# Patient Record
Sex: Female | Born: 1945
Health system: Southern US, Community
[De-identification: ages and names within clinical notes are randomized; demographics above are authoritative.]

## PROBLEM LIST (undated history)

## (undated) DIAGNOSIS — D649 Anemia, unspecified: Secondary | ICD-10-CM

## (undated) DIAGNOSIS — T7840XA Allergy, unspecified, initial encounter: Secondary | ICD-10-CM

## (undated) DIAGNOSIS — I10 Essential (primary) hypertension: Secondary | ICD-10-CM

## (undated) DIAGNOSIS — I2510 Atherosclerotic heart disease of native coronary artery without angina pectoris: Secondary | ICD-10-CM

## (undated) DIAGNOSIS — I351 Nonrheumatic aortic (valve) insufficiency: Secondary | ICD-10-CM

## (undated) DIAGNOSIS — J301 Allergic rhinitis due to pollen: Secondary | ICD-10-CM

## (undated) DIAGNOSIS — K219 Gastro-esophageal reflux disease without esophagitis: Secondary | ICD-10-CM

## (undated) DIAGNOSIS — I484 Atypical atrial flutter: Secondary | ICD-10-CM

## (undated) DIAGNOSIS — J45909 Unspecified asthma, uncomplicated: Secondary | ICD-10-CM

## (undated) DIAGNOSIS — M1990 Unspecified osteoarthritis, unspecified site: Secondary | ICD-10-CM

## (undated) DIAGNOSIS — E785 Hyperlipidemia, unspecified: Secondary | ICD-10-CM

## (undated) DIAGNOSIS — Z8709 Personal history of other diseases of the respiratory system: Secondary | ICD-10-CM

## (undated) DIAGNOSIS — I340 Nonrheumatic mitral (valve) insufficiency: Secondary | ICD-10-CM

## (undated) HISTORY — DX: Atherosclerotic heart disease of native coronary artery without angina pectoris: I25.10

## (undated) HISTORY — DX: Essential (primary) hypertension: I10

## (undated) HISTORY — DX: Allergic rhinitis due to pollen: J30.1

## (undated) HISTORY — DX: Unspecified osteoarthritis, unspecified site: M19.90

## (undated) HISTORY — PX: CHOLECYSTECTOMY: SHX55

## (undated) HISTORY — PX: DOPPLER ECHOCARDIOGRAPHY: SHX263

## (undated) HISTORY — PX: TONSILLECTOMY: SUR1361

## (undated) HISTORY — PX: COLONOSCOPY: SHX174

## (undated) HISTORY — DX: Anemia, unspecified: D64.9

## (undated) HISTORY — DX: Hyperlipidemia, unspecified: E78.5

## (undated) HISTORY — DX: Allergy, unspecified, initial encounter: T78.40XA

## (undated) HISTORY — DX: Gastro-esophageal reflux disease without esophagitis: K21.9

## (undated) HISTORY — DX: Nonrheumatic aortic (valve) insufficiency: I35.1

---

## 2005-06-26 DIAGNOSIS — Z8709 Personal history of other diseases of the respiratory system: Secondary | ICD-10-CM

## 2005-06-26 HISTORY — DX: Personal history of other diseases of the respiratory system: Z87.09

## 2005-10-24 HISTORY — PX: CORONARY ARTERY BYPASS GRAFT: SHX141

## 2005-11-24 HISTORY — PX: OTHER SURGICAL HISTORY: SHX169

## 2005-12-05 ENCOUNTER — Encounter: Payer: Self-pay | Admitting: Internal Medicine

## 2006-03-20 ENCOUNTER — Ambulatory Visit: Payer: Self-pay | Admitting: Internal Medicine

## 2006-09-26 ENCOUNTER — Ambulatory Visit: Payer: Self-pay | Admitting: Internal Medicine

## 2006-09-26 LAB — CONVERTED CEMR LAB
ALT: 22 units/L (ref 0–40)
BUN: 15 mg/dL (ref 6–23)
CO2: 30 meq/L (ref 19–32)
Cholesterol: 112 mg/dL (ref 0–200)
Glucose, Bld: 98 mg/dL (ref 70–99)
LDL Cholesterol: 47 mg/dL (ref 0–99)
Phosphorus: 3.8 mg/dL (ref 2.3–4.6)
Potassium: 4.5 meq/L (ref 3.5–5.1)
Sodium: 144 meq/L (ref 135–145)
Total CHOL/HDL Ratio: 2.3
Triglycerides: 79 mg/dL (ref 0–149)

## 2006-09-28 ENCOUNTER — Encounter: Payer: Self-pay | Admitting: Internal Medicine

## 2006-09-28 ENCOUNTER — Ambulatory Visit: Payer: Self-pay | Admitting: Internal Medicine

## 2006-09-28 DIAGNOSIS — M719 Bursopathy, unspecified: Secondary | ICD-10-CM

## 2006-09-28 DIAGNOSIS — I2510 Atherosclerotic heart disease of native coronary artery without angina pectoris: Secondary | ICD-10-CM | POA: Insufficient documentation

## 2006-09-28 DIAGNOSIS — E785 Hyperlipidemia, unspecified: Secondary | ICD-10-CM

## 2006-09-28 DIAGNOSIS — K219 Gastro-esophageal reflux disease without esophagitis: Secondary | ICD-10-CM | POA: Insufficient documentation

## 2006-09-28 DIAGNOSIS — M67919 Unspecified disorder of synovium and tendon, unspecified shoulder: Secondary | ICD-10-CM | POA: Insufficient documentation

## 2006-10-02 ENCOUNTER — Encounter: Payer: Self-pay | Admitting: Internal Medicine

## 2007-03-27 ENCOUNTER — Ambulatory Visit: Payer: Self-pay | Admitting: Internal Medicine

## 2007-03-27 DIAGNOSIS — I351 Nonrheumatic aortic (valve) insufficiency: Secondary | ICD-10-CM

## 2007-03-27 HISTORY — DX: Nonrheumatic aortic (valve) insufficiency: I35.1

## 2007-03-28 LAB — CONVERTED CEMR LAB
ALT: 25 units/L (ref 0–35)
BUN: 14 mg/dL (ref 6–23)
Basophils Relative: 0.1 % (ref 0.0–1.0)
CO2: 30 meq/L (ref 19–32)
Calcium: 9.5 mg/dL (ref 8.4–10.5)
Chloride: 110 meq/L (ref 96–112)
Eosinophils Relative: 4.5 % (ref 0.0–5.0)
GFR calc Af Amer: 82 mL/min
Glucose, Bld: 99 mg/dL (ref 70–99)
HCT: 38 % (ref 36.0–46.0)
LDL Cholesterol: 64 mg/dL (ref 0–99)
Neutrophils Relative %: 55.2 % (ref 43.0–77.0)
Platelets: 225 10*3/uL (ref 150–400)
RBC: 4.07 M/uL (ref 3.87–5.11)
RDW: 12.9 % (ref 11.5–14.6)
TSH: 1.65 microintl units/mL (ref 0.35–5.50)
Total CHOL/HDL Ratio: 2.8
VLDL: 29 mg/dL (ref 0–40)
WBC: 4.8 10*3/uL (ref 4.5–10.5)

## 2007-03-29 ENCOUNTER — Ambulatory Visit: Payer: Self-pay | Admitting: Internal Medicine

## 2007-03-29 ENCOUNTER — Other Ambulatory Visit: Admission: RE | Admit: 2007-03-29 | Discharge: 2007-03-29 | Payer: Self-pay | Admitting: Internal Medicine

## 2007-03-29 ENCOUNTER — Encounter: Payer: Self-pay | Admitting: Internal Medicine

## 2007-04-03 ENCOUNTER — Encounter (INDEPENDENT_AMBULATORY_CARE_PROVIDER_SITE_OTHER): Payer: Self-pay | Admitting: *Deleted

## 2007-04-03 LAB — CONVERTED CEMR LAB: Pap Smear: NORMAL

## 2007-04-10 ENCOUNTER — Ambulatory Visit: Payer: Self-pay | Admitting: Internal Medicine

## 2007-04-16 ENCOUNTER — Ambulatory Visit: Payer: Self-pay

## 2007-04-21 DIAGNOSIS — H60509 Unspecified acute noninfective otitis externa, unspecified ear: Secondary | ICD-10-CM

## 2007-04-24 ENCOUNTER — Ambulatory Visit: Payer: Self-pay | Admitting: *Deleted

## 2007-04-24 ENCOUNTER — Encounter: Payer: Self-pay | Admitting: Internal Medicine

## 2007-04-30 ENCOUNTER — Encounter (INDEPENDENT_AMBULATORY_CARE_PROVIDER_SITE_OTHER): Payer: Self-pay | Admitting: *Deleted

## 2007-09-09 ENCOUNTER — Encounter: Payer: Self-pay | Admitting: Internal Medicine

## 2008-04-29 ENCOUNTER — Ambulatory Visit: Payer: Self-pay | Admitting: Internal Medicine

## 2008-05-06 ENCOUNTER — Encounter: Payer: Self-pay | Admitting: Internal Medicine

## 2008-05-06 ENCOUNTER — Ambulatory Visit: Payer: Self-pay | Admitting: Cardiology

## 2008-05-06 LAB — CONVERTED CEMR LAB
BUN: 23 mg/dL (ref 6–23)
Chloride: 105 meq/L (ref 96–112)
Creatinine, Ser: 0.86 mg/dL (ref 0.40–1.20)
Potassium: 4.4 meq/L (ref 3.5–5.3)

## 2008-05-14 ENCOUNTER — Ambulatory Visit: Payer: Self-pay | Admitting: Internal Medicine

## 2008-05-15 LAB — CONVERTED CEMR LAB
BUN: 18 mg/dL (ref 6–23)
Basophils Absolute: 0.1 10*3/uL (ref 0.0–0.1)
Bilirubin, Direct: 0.1 mg/dL (ref 0.0–0.3)
CO2: 28 meq/L (ref 19–32)
Chloride: 105 meq/L (ref 96–112)
Cholesterol: 153 mg/dL (ref 0–200)
Eosinophils Absolute: 0.3 10*3/uL (ref 0.0–0.7)
Glucose, Bld: 94 mg/dL (ref 70–99)
HCT: 38.5 % (ref 36.0–46.0)
HDL: 64.4 mg/dL (ref >39.0)
Hemoglobin: 13.1 g/dL (ref 12.0–15.0)
MCHC: 34 g/dL (ref 30.0–36.0)
Monocytes Absolute: 0.4 10*3/uL (ref 0.1–1.0)
Monocytes Relative: 8.6 % (ref 3.0–12.0)
Neutro Abs: 2.6 10*3/uL (ref 1.4–7.7)
Phosphorus: 4.4 mg/dL (ref 2.3–4.6)
Platelets: 216 10*3/uL (ref 150–400)
Potassium: 4.4 meq/L (ref 3.5–5.1)
RDW: 13 % (ref 11.5–14.6)
Sodium: 142 meq/L (ref 135–145)
TSH: 1.28 microintl units/mL (ref 0.35–5.50)
Total Bilirubin: 0.8 mg/dL (ref 0.3–1.2)
Total CHOL/HDL Ratio: 2.4
Triglycerides: 107 mg/dL (ref 0–149)

## 2008-05-27 ENCOUNTER — Ambulatory Visit: Payer: Self-pay | Admitting: Internal Medicine

## 2008-05-27 DIAGNOSIS — I10 Essential (primary) hypertension: Secondary | ICD-10-CM

## 2008-07-30 ENCOUNTER — Encounter: Payer: Self-pay | Admitting: Internal Medicine

## 2008-08-03 ENCOUNTER — Encounter: Payer: Self-pay | Admitting: Internal Medicine

## 2008-09-17 ENCOUNTER — Encounter (INDEPENDENT_AMBULATORY_CARE_PROVIDER_SITE_OTHER): Payer: Self-pay | Admitting: *Deleted

## 2008-09-17 ENCOUNTER — Ambulatory Visit: Payer: Self-pay | Admitting: Internal Medicine

## 2008-09-17 ENCOUNTER — Encounter: Payer: Self-pay | Admitting: Internal Medicine

## 2008-09-17 DIAGNOSIS — IMO0001 Reserved for inherently not codable concepts without codable children: Secondary | ICD-10-CM | POA: Insufficient documentation

## 2008-11-11 ENCOUNTER — Telehealth: Payer: Self-pay | Admitting: Internal Medicine

## 2008-11-11 ENCOUNTER — Ambulatory Visit: Payer: Self-pay | Admitting: Internal Medicine

## 2008-11-17 LAB — CONVERTED CEMR LAB
ALT: 23 units/L (ref 0–35)
AST: 26 units/L (ref 0–37)
Albumin: 3.8 g/dL (ref 3.5–5.2)
BUN: 19 mg/dL (ref 6–23)
Cholesterol: 155 mg/dL (ref 0–200)
Creatinine, Ser: 0.9 mg/dL (ref 0.4–1.2)
GFR calc non Af Amer: 67.08 mL/min (ref >60)
Glucose, Bld: 97 mg/dL (ref 70–99)
HDL: 56.1 mg/dL (ref >39.00)
LDL Cholesterol: 73 mg/dL (ref 0–99)
Potassium: 4.7 meq/L (ref 3.5–5.1)
Triglycerides: 128 mg/dL (ref 0.0–149.0)
VLDL: 25.6 mg/dL (ref 0.0–40.0)

## 2009-01-11 ENCOUNTER — Ambulatory Visit: Payer: Self-pay | Admitting: Family Medicine

## 2009-01-11 LAB — CONVERTED CEMR LAB: Rapid Strep: NEGATIVE

## 2009-05-24 ENCOUNTER — Ambulatory Visit: Payer: Self-pay | Admitting: Internal Medicine

## 2009-05-25 LAB — CONVERTED CEMR LAB
Alkaline Phosphatase: 61 units/L (ref 39–117)
BUN: 14 mg/dL (ref 6–23)
Basophils Absolute: 0 10*3/uL (ref 0.0–0.1)
Bilirubin, Direct: 0.1 mg/dL (ref 0.0–0.3)
Chloride: 105 meq/L (ref 96–112)
Creatinine, Ser: 1 mg/dL (ref 0.4–1.2)
Eosinophils Relative: 5.3 % — ABNORMAL HIGH (ref 0.0–5.0)
GFR calc non Af Amer: 59.3 mL/min (ref >60)
LDL Cholesterol: 29 mg/dL (ref 0–99)
Lymphocytes Relative: 31.7 % (ref 12.0–46.0)
Monocytes Relative: 10 % (ref 3.0–12.0)
Phosphorus: 3.9 mg/dL (ref 2.3–4.6)
Platelets: 198 10*3/uL (ref 150.0–400.0)
RDW: 12.9 % (ref 11.5–14.6)
Sodium: 141 meq/L (ref 135–145)
TSH: 1.79 microintl units/mL (ref 0.35–5.50)
Total Bilirubin: 0.8 mg/dL (ref 0.3–1.2)
Total CHOL/HDL Ratio: 2
VLDL: 14.4 mg/dL (ref 0.0–40.0)
WBC: 4 10*3/uL — ABNORMAL LOW (ref 4.5–10.5)

## 2009-05-26 ENCOUNTER — Ambulatory Visit: Payer: Self-pay | Admitting: Internal Medicine

## 2009-07-12 ENCOUNTER — Ambulatory Visit: Payer: Self-pay | Admitting: Internal Medicine

## 2009-07-22 ENCOUNTER — Ambulatory Visit: Payer: Self-pay

## 2009-07-22 ENCOUNTER — Encounter: Payer: Self-pay | Admitting: Internal Medicine

## 2009-08-04 ENCOUNTER — Encounter: Payer: Self-pay | Admitting: Internal Medicine

## 2009-12-08 ENCOUNTER — Telehealth: Payer: Self-pay | Admitting: Internal Medicine

## 2009-12-10 ENCOUNTER — Encounter: Payer: Self-pay | Admitting: Internal Medicine

## 2010-02-11 ENCOUNTER — Encounter: Payer: Self-pay | Admitting: Internal Medicine

## 2010-02-21 ENCOUNTER — Ambulatory Visit: Payer: Self-pay | Admitting: Internal Medicine

## 2010-02-24 ENCOUNTER — Ambulatory Visit: Payer: Self-pay | Admitting: Internal Medicine

## 2010-03-02 LAB — CONVERTED CEMR LAB
ALT: 22 units/L (ref 0–35)
BUN: 17 mg/dL (ref 6–23)
Bilirubin, Direct: 0.1 mg/dL (ref 0.0–0.3)
CO2: 27 meq/L (ref 19–32)
Chloride: 103 meq/L (ref 96–112)
Cholesterol: 159 mg/dL (ref 0–200)
Creatinine, Ser: 1 mg/dL (ref 0.4–1.2)
Glucose, Bld: 95 mg/dL (ref 70–99)
Total CHOL/HDL Ratio: 3
Total Protein: 6.4 g/dL (ref 6.0–8.3)
Triglycerides: 103 mg/dL (ref 0.0–149.0)

## 2010-04-14 ENCOUNTER — Encounter: Payer: Self-pay | Admitting: Internal Medicine

## 2010-06-14 ENCOUNTER — Ambulatory Visit: Payer: Self-pay | Admitting: Internal Medicine

## 2010-06-14 ENCOUNTER — Encounter: Payer: Self-pay | Admitting: Internal Medicine

## 2010-07-26 NOTE — Miscellaneous (Signed)
Summary: FLU VACCINE   Clinical Lists Changes  Observations: Added new observation of FLU VAX: Historical (02/24/2010 14:52)      Influenza Immunization History:    Influenza # 1:  Historical (02/24/2010)

## 2010-07-26 NOTE — Assessment & Plan Note (Signed)
Summary: ROV/AMD  Medications Added CALCIUM 500 MG TABS (CALCIUM CARBONATE) take 1 by mouth once daily      Allergies Added:   Visit Type:  rov Primary Provider:  Cindee Salt MD  CC:  no cp, no swelling, and no sob.  History of Present Illness: 65 y/o woman with h/o 1-v CAD s/p CABG 2007, HTN, mild AI and HL. Returns for routine f/u.  Father died a week ago and she is sad over that. No CP or dyspnea. No palpitations. No HF symptoms.  Very active with walking and indoor/outdoor activities. Tolerating all meds well.  No focal neuro deficits.  Lipids from 12/10 TC 89 HDL 45, LDL 29.    Current Medications (verified): 1)  Multivitamins   Tabs (Multiple Vitamin) .... Take One By Mouth Once A Day 2)  Aspirin 81 Mg  Tbec (Aspirin) .... Take One By Mouth Once A Day 3)  Simvastatin 40 Mg Tabs (Simvastatin) .... 1/2 Daily or As Directed 4)  Cozaar 50 Mg Tabs (Losartan Potassium) .... Take One Tablet By Mouth Daily 5)  Calcium 500 Mg Tabs (Calcium Carbonate) .... Take 1 By Mouth Once Daily 6)  Glucosamine 500 Mg Caps (Glucosamine Sulfate) .... Take 1 By Mouth Two Times A Day 7)  Lutein 6 Mg Caps (Lutein) .... Take 1 By Mouth Once Daily 8)  Fish Oil 1000 Mg Caps (Omega-3 Fatty Acids) .... Take 1 By Mouth Two Times A Day  Allergies (verified): 1)  * Morphine 2)  * Opiates 3)  Codeine 4)  Lisinopril  Past History:  Past Medical History: Last updated: 09/14/2008 1. CAD, one-vessel     a.  s/p LIMA->LAD, [facility] 2007 2. Hypertension 3. Hyperlipidemia     a. Unable to tolerate simva 40 due to myalgias 4. Mild aortic inssuficiency     a.  Echocardiogram in October 2008, showed an EF of 55-65% with mild AI 5. GERD  Review of Systems       As per HPI and past medical history; otherwise all systems negative.   Vital Signs:  Patient profile:   65 year old female Height:      64 inches Weight:      179.50 pounds BMI:     30.92 Pulse rate:   62 / minute Pulse  rhythm:   regular BP sitting:   134 / 76  (right arm) Cuff size:   regular  Vitals Entered By: Mercer Pod (July 12, 2009 4:04 PM)  Physical Exam  General:  Gen: well appearing. no resp difficulty HEENT: normal Neck: supple. no JVD. Carotids 2+ bilat; soft L bruits. No lymphadenopathy or thryomegaly appreciated. Cor: PMI nondisplaced. Regular rate & rhythm. No rubs, gallops. soft 2/6 systolic murmur RSB soft diastolic murmur Lungs: clear Abdomen: soft, nontender, nondistended. No hepatosplenomegaly. No bruits or masses. Good bowel sounds. Extremities: no cyanosis, clubbing, rash, edema Neuro: alert & orientedx3, cranial nerves grossly intact. moves all 4 extremities w/o difficulty. affect pleasant    Impression & Recommendations:  Problem # 1:  CORONARY ARTERY DISEASE (ICD-414.00) Stable. No evidence of ischemia. Continue current regimen.  Problem # 2:  HYPERTENSION (ICD-401.9) BP a bit high today but overall well controlled. Continue current regimen  Problem # 3:  BRUIT (ICD-785.9) Check carotid u/s.  Orders: Carotid Duplex (Carotid Duplex)  Problem # 4:  AORTIC STENOSIS/ INSUFFICIENCY, NON-RHEUMATIC (ICD-424.1) Due for f/u echo.   Her updated medication list for this problem includes:    Cozaar 50 Mg Tabs (  Losartan potassium) .Marland Kitchen... Take one tablet by mouth daily  Patient Instructions: 1)  Your physician recommends that you schedule a follow-up appointment in: 6 months 2)  Your physician recommends that you continue on your current medications as directed. Please refer to the Current Medication list given to you today. 3)  Your physician has requested that you have a carotid duplex. This test is an ultrasound of the carotid arteries in your neck. It looks at blood flow through these arteries that supply the brain with blood. Allow one hour for this exam. There are no restrictions or special instructions. 4)  Your physician has requested that you have an  echocardiogram.  Echocardiography is a painless test that uses sound waves to create images of your heart. It provides your doctor with information about the size and shape of your heart and how well your heart's chambers and valves are working.  This procedure takes approximately one hour. There are no restrictions for this procedure. 07/22/09 @ 1:00

## 2010-07-26 NOTE — Miscellaneous (Signed)
Summary: Lipid/CMET  Clinical Lists Changes  Orders: Added new Test order of T-Lipid Profile [phone]) - Signed Added new Test order of T-Comprehensive Metabolic Panel [id number]) - Signed

## 2010-07-26 NOTE — Progress Notes (Signed)
Summary: RX Losartin   Phone Note Refill Request Call back at Home Phone [phone] Message from:  Patient on December 08, 2009 9:02 AM  LOZARTIN 100 MG 1 TABLET PER DAY-[facility]-PLEASE CALL PATIENT  Initial call taken by: Harlon Flor,  December 08, 2009 9:03 AM  Follow-up for Phone Call        PT IS CALLING ABOUT THE RX AGAIN-WOULD LIKE FOR SOMEONE TO LEAVE A MESSAGE ON HER ANSWERING MACHINE STATING WHAT SHE NEEDS TO DO TO GET THIS REFILLED, AS SHE IS DOWN TO HER LAST TABLET Follow-up by: Harlon Flor,  December 08, 2009 11:45 AM    Prescriptions: COZAAR 50 MG TABS (LOSARTAN POTASSIUM) Take one tablet by mouth daily  #30 x 6   Entered by:   Bishop Dublin, CMA   Authorized by:   Dolores Patty, MD, [facility]   Signed by:   Bishop Dublin, CMA on 12/08/2009   Method used:   Faxed to ...       [facility] [place] (mail-order)             , [place]         Ph: [phone]       Fax: [phone]   RxID:   [id number]

## 2010-07-26 NOTE — Miscellaneous (Signed)
Summary: Losartan   Clinical Lists Changes  Medications: Rx of COZAAR 50 MG TABS (LOSARTAN POTASSIUM) Take one tablet by mouth daily;  #90 x 2;  Signed;  Entered by: Benedict Needy, RN;  Authorized by: Dolores Patty, MD, [facility];  Method used: Faxed to [facility] [place], , , [place]  , Ph: [phone], Fax: [phone]    Prescriptions: COZAAR 50 MG TABS (LOSARTAN POTASSIUM) Take one tablet by mouth daily  #90 x 2   Entered by:   Benedict Needy, RN   Authorized by:   Dolores Patty, MD, [facility]   Signed by:   Benedict Needy, RN on 12/10/2009   Method used:   Faxed to ...       [facility] [place] (mail-order)             , [place]         Ph: [phone]       Fax: [phone]   RxID:   [id number]

## 2010-07-26 NOTE — Assessment & Plan Note (Signed)
Summary: F6M/AMD      Allergies Added:   Visit Type:  Follow-up Primary Provider:  Cindee Salt MD  CC:  Denies chest pain or shortness of breath..  History of Present Illness: 65 y/o woman with h/o 1-v CAD s/p CABG 2007, HTN, mild AI and HL. Returns for routine f/u.  Doing well very active with CP or SOB. Stopped her statin for a month to see if it would improve her aches and no change. Was using a pedometer and walking 3 miles a day as part of her daily activities. SBP typically 130-135.   Recent lipids  TC 159 mg/dL TG 831  HDL 53  LDL 85   Current Medications (verified): 1)  Multivitamins   Tabs (Multiple Vitamin) .... Take One By Mouth Once A Day 2)  Aspirin 81 Mg  Tbec (Aspirin) .... Take One By Mouth Once A Day 3)  Simvastatin 40 Mg Tabs (Simvastatin) .... 1/2 Daily or As Directed 4)  Cozaar 50 Mg Tabs (Losartan Potassium) .... Take One Tablet By Mouth Daily 5)  Calcium 500 Mg Tabs (Calcium Carbonate) .... Take 1 By Mouth Once Daily 6)  Glucosamine 500 Mg Caps (Glucosamine Sulfate) .... Take 1 By Mouth Two Times A Day 7)  Lutein 6 Mg Caps (Lutein) .... Take 1 By Mouth Once Daily 8)  Fish Oil 1000 Mg Caps (Omega-3 Fatty Acids) .... Take 1 By Mouth Two Times A Day  Allergies (verified): 1)  * Morphine 2)  * Opiates 3)  Codeine 4)  Lisinopril  Past History:  Past Medical History: Last updated: 09/14/2008 1. CAD, one-vessel     a.  s/p LIMA->LAD, [facility] 2007 2. Hypertension 3. Hyperlipidemia     a. Unable to tolerate simva 40 due to myalgias 4. Mild aortic inssuficiency     a.  Echocardiogram in October 2008, showed an EF of 55-65% with mild AI 5. 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   Vital Signs:  Patient profile:   65 year old female Height:      64 inches Weight:      190 pounds BMI:     32.73 Pulse rate:   71 / minute BP sitting:   138 / 72  (left arm) Cuff size:   regular  Vitals Entered By: Bishop Dublin, CMA (February 24, 2010 1:53 PM)  Physical Exam  General:  Well appearing. no resp difficulty HEENT: normal Neck: supple. no JVD. Carotids 2+ bilat; soft L bruits. No lymphadenopathy or thryomegaly appreciated. Cor: PMI nondisplaced. Regular rate & rhythm. No rubs, gallops. soft 2/6 systolic murmur RSB soft diastolic murmur Lungs: clear Abdomen: soft, nontender, nondistended. No hepatosplenomegaly. No bruits or masses. Good bowel sounds. Extremities: no cyanosis, clubbing, rash, edema. severe varicose veins Neuro: alert & orientedx3, cranial nerves grossly intact. moves all 4 extremities w/o difficulty. affect  pleasant    Impression & Recommendations:  Problem # 1:  CORONARY ARTERY DISEASE (ICD-414.00) Stable. No evidence of ischemia. Continue current regimen.  Problem # 2:  AORTIC STENOSIS/ INSUFFICIENCY, NON-RHEUMATIC (ICD-424.1) Mild. Asymptomatic. Can consider f/u echo as needed.   Problem # 3:  HYPERLIPIDEMIA (ICD-272.4) LDL just above goal. Will restart statin. Encouraged her to get more regular exercise and keep her weight down.   Other Orders: EKG w/ Interpretation (93000)  Patient Instructions: 1)  Your physician recommends that you schedule a follow-up appointment in: 1 year.

## 2010-07-28 NOTE — Assessment & Plan Note (Signed)
Summary: FOLLOW UP   Vital Signs:  Patient profile:   65 year old female Weight:      192 pounds Temp:     98.3 degrees F oral Pulse rate:   68 / minute Pulse rhythm:   regular BP sitting:   140 / 70  (left arm) Cuff size:   regular  Vitals Entered By: Mervin Hack CMA Duncan Dull) (June 14, 2010 10:08 AM) CC: follow-up visit   History of Present Illness: Has been coughing for 2 months thinks she has gotten the same thing twice croupy cough and nasty tasting discharge May be clearing up mucus is clear from nose No fever Slight SOB--intermittent At times had sense of congestion in lungs  heart is fine No chest pain stays active and exercises regularly--walks, gardening  Did try off the simvastatin due to arm aching No real improvement Now back on it but at half dose  Was having increasing bouts of reflux Believes some of it was related to dietary choices Occ night reflux and day irritation Much better on the omeprazole  Allergies: 1)  * Morphine 2)  * Opiates 3)  Codeine 4)  Lisinopril  Past History:  Past medical, surgical, family and social histories (including risk factors) reviewed for relevance to current acute and chronic problems.  Past Medical History: 1. CAD, one-vessel     a.  s/p LIMA->LAD, [facility] 2007 2. Hypertension 3. Hyperlipidemia     a. Unable to tolerate simvastatin  40 due to myalgias 4. Mild aortic inssuficiency     a.  Echocardiogram in October 2008, showed an EF of 55-65% with mild AI 5. GERD  Past Surgical History: Reviewed history from 03/12/2007 and no changes required. Coronary artery bypass graft--readmitted for post op pleural effusions 05/07 Cholecystectomy Tonsillectomy C-section x1 Stress imaging negative EF 67% 06/07 Echo- NV LV EF 1+ AI/MR  Family History: Reviewed history from 03/12/2007 and no changes required. Father: Alive Mother: Janalyn Shy, MI at 68 One brother  CAD--Mat GM deceased at age 61 with  MI No HTN or DM No colon or breast cancer  Social History: Reviewed history from 03/29/2007 and no changes required. 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   Nose:  mild congestion slight irritation and blood on right Mouth:  no erythema, no exudates, and no lesions.   Neck:  supple, no masses, and no cervical lymphadenopathy.   Lungs:  normal respiratory effort, no intercostal retractions, no accessory muscle use, normal breath sounds, no crackles, and no wheezes.   Heart:  normal rate, regular rhythm, no murmur, and no gallop.   Abdomen:  soft, non-tender, and no masses.   Extremities:  no edema Psych:  normally interactive, good eye contact, not anxious appearing, and not depressed appearing.     Impression & Recommendations:  Problem # 1:  COUGH (ICD-786.2) Assessment New  sounds like she may have atypical infeciton never overly sick antibioitics don't seem indicated mild obstruction on spirometry--not sure if this is different from her baseline for now, will just try albuterol inhaler  Orders: Spirometry w/Graph (94010)  Problem # 2:  GERD (ICD-530.81) Assessment: Deteriorated had worsened but is controlled on the omeprazole regularly  The following medications were removed from the medication list:  [facility] Omeprazole 20.6 (20 Base) Mg Cpdr (Omeprazole magnesium) .Marland Kitchen... Take 1 by mouth once daily Her updated medication list for this problem includes:    Omeprazole 20 Mg Cpdr (Omeprazole) .Marland Kitchen... 1 tab daily for persistent reflux symptoms  Problem # 3:  HYPERTENSION (ICD-401.9) Assessment: Unchanged good control no changes needed  Her updated medication list for this problem includes:    Cozaar 50 Mg Tabs (Losartan potassium) .Marland Kitchen... Take  one tablet by mouth daily  BP today: 140/70 Prior BP: 138/72 (02/24/2010)  Labs Reviewed: K+: 4.7 (02/21/2010) Creat: : 1.0 (02/21/2010)   Chol: 159 (02/21/2010)   HDL: 53.00 (02/21/2010)   LDL: 85 (02/21/2010)   TG: 103.0 (02/21/2010)  Problem # 4:  CORONARY ARTERY DISEASE (ICD-414.00) Assessment: Unchanged has been quiet sees Dr Jones Broom  Her updated medication list for this problem includes:    Cozaar 50 Mg Tabs (Losartan potassium) .Marland Kitchen... Take one tablet by mouth daily    Aspirin 81 Mg Tbec (Aspirin) .Marland Kitchen... Take one by mouth once a day  Complete Medication List: 1)  Simvastatin 40 Mg Tabs (Simvastatin) .... 1/2 daily or as directed 2)  Cozaar 50 Mg Tabs (Losartan potassium) .... Take one tablet by mouth daily 3)  Multivitamins Tabs (Multiple vitamin) .... Take one by mouth once a day 4)  Aspirin 81 Mg Tbec (Aspirin) .... Take one by mouth once a day 5)  Calcium 500 Mg Tabs (Calcium carbonate) .... Take 1 by mouth once daily 6)  Glucosamine 500 Mg Caps (Glucosamine sulfate) .... Take 1 by mouth two times a day 7)  Lutein 6 Mg Caps (Lutein) .... Take 1 by mouth once daily 8)  Fish Oil 1000 Mg Caps (Omega-3 fatty acids) .... Take 1 by mouth two times a day 9)  Ventolin Hfa 108 (90 Base) Mcg/act Aers (Albuterol sulfate) .... 2 puffs up to four times daily for cough or wheezing 10)  Omeprazole 20 Mg Cpdr (Omeprazole) .Marland Kitchen.. 1 tab daily for persistent reflux symptoms  Other Orders: Radiology Referral (Radiology)  Patient Instructions: 1)  Please schedule a follow-up appointment in 1 year for Medicare Wellness visit 2)  Please set up bone density scan Prescriptions: OMEPRAZOLE 20 MG CPDR (OMEPRAZOLE) 1 tab daily for persistent reflux symptoms  #90 x 3   Entered and Authorized by:   Cindee Salt MD   Signed by:   Cindee Salt MD on 06/14/2010   Method used:   Faxed to ...       [facility] [place] (mail-order)             , [place]         Ph: [phone]       Fax: [phone]    RxID:   [id number] VENTOLIN HFA 108 (90 BASE) MCG/ACT AERS (ALBUTEROL SULFATE) 2 puffs up to four times daily for cough or wheezing  #1 x 1   Entered and Authorized by:   Cindee Salt MD   Signed by:   Cindee Salt MD on 06/14/2010   Method used:   Electronically to        [facility]. [address] [phone]* (retail)       [address] [place], [place]  [id number]       Ph: [phone]       Fax: [phone]   RxID:   [id number]    Orders Added: 1)  Est. Patient Level IV [16010] 2)  Spirometry w/Graph [94010] 3)  Radiology Referral [Radiology]    Current Allergies (reviewed today): * MORPHINE * OPIATES CODEINE LISINOPRIL

## 2010-09-21 ENCOUNTER — Ambulatory Visit: Payer: Medicare Other | Admitting: Internal Medicine

## 2010-09-26 ENCOUNTER — Telehealth: Payer: Self-pay | Admitting: Internal Medicine

## 2010-09-26 DIAGNOSIS — M8580 Other specified disorders of bone density and structure, unspecified site: Secondary | ICD-10-CM

## 2010-09-26 NOTE — Telephone Encounter (Signed)
Pt is out of town and will not be back in town until October 10, 2010

## 2010-09-26 NOTE — Telephone Encounter (Signed)
Please call The bone density study just shows mild bone loss, or osteopenia Other than regular weight bearing exercise (like walking), I would just recommend vitamin D 1000 int units daily (OTC) and some dietary calcium (dairy, cheese, etc) We can repeat this in 3-5 years to make sure it is stable

## 2010-09-27 ENCOUNTER — Encounter: Payer: Self-pay | Admitting: *Deleted

## 2010-09-27 NOTE — Telephone Encounter (Signed)
Letter mailed to patient's home address with results, patient will not be back in town until April 16

## 2010-09-29 ENCOUNTER — Encounter: Payer: Self-pay | Admitting: Internal Medicine

## 2010-10-20 ENCOUNTER — Other Ambulatory Visit: Payer: Self-pay | Admitting: Internal Medicine

## 2010-11-08 NOTE — Assessment & Plan Note (Signed)
[facility] OFFICE NOTE   LASHANA, SPANG                       MRN:          [id number]  DATE:04/10/2007                            DOB:          12-Nov-1945    REFERRING PHYSICIAN:  Dr. Tillman Abide.   REASON FOR CONSULTATION:  Coronary artery disease; establish long term  care.   Ms. Hostler is a delightful 65 year old woman with a history of one  vessel coronary artery disease status post bypass surgery in 2007 with a  LIMA to the LAD at [facility] in [place].   She actually has a fairly interesting story. She denies any significant  cardiac risk factors. She previously worked in the [employer] and  after retiring, her and her husband spent 3.5 years traveling through  the country, [place], and [place] in an RV. While they were in [place],  she developed exertional chest pain. She underwent cardiac  catheterization at [facility] and was found to have one vessel CAD  with an isolated blockage in her LAD. Apparently, she was told that  there was some mechanical deformity of the LAD, which led to a  deposition or acceleration of atherosclerosis, which she required bypass  for.   Since that time, she has done great. She has had occasional aches and  pains in her chest, but she attributes this mostly to her surgery. She  has not had any angina. She does note that when she goes up a lot of  steps, she sometimes gets short of breath, but thinks that this is due  to her age. She does admit to the fact that she is nervous about the  possibility of having problems with her heart again. Other than that,  she remains fairly active without any problems.   REVIEW OF SYSTEMS:  Notable for hay fever, allergies, asthma, as well as  osteoarthritis. All other systems are negative except for HPI and  problem list. She sometimes feels a bit sluggish.   PROBLEM LIST:  1. Coronary artery disease.     a.     Status post LIMA to the LAD in May 2000 at [facility]       in [place] as described above.  2. Mild obesity.  3. History of gallstones status post cholecystectomy.   CURRENT MEDICATIONS:  1. Simvastatin 20.  2. Metoprolol 25 b.i.d.  3. Multivitamin.  4. Calcium.  5. Glucosamine.  6. Fish oil t.i.d.  7. Aspirin 81.   ALLERGIES:  OPIATES including MORPHINE and CODEINE.   SOCIAL HISTORY:  She is married with one child. She is retired. Social  alcohol. She quit smoking in 1996.   FAMILY HISTORY:  Mother is alive and well at 68. Father is alive and  well at 68. One brother alive and well at 21. No family history of  premature coronary disease.   PHYSICAL EXAMINATION:  GENERAL:  She is a delightful woman. She  ambulates around the clinic without any respiratory difficulty.  VITAL SIGNS:  Initial blood pressure is 108/72; on manual recheck  154/78,  heart rate 53, weight 180.  HEENT:  Normal.  NECK:  Supple. No JVD. Carotids are 2+ bilaterally without any bruits.  There is no lymphadenopathy or thyromegaly.  CARDIAC:  PMI is non-displaced. Regular rate and rhythm, no murmurs,  rubs, or gallops.  CHEST:  Her sternum is stable.  LUNGS:  Clear.  ABDOMEN:  Soft, nontender, nondistended, no hepatosplenomegaly, no  masses or bruits appreciated. Good bowel sounds.  EXTREMITIES:  Warm with no cyanosis, clubbing, or edema. Good pulses. No  rash.  NEUROLOGIC:  Alert and oriented x3. Cranial nerves II-XII are intact.  She can use all 4 extremities without difficulty. Affect is pleasant.   EKG shows sinus bradycardia at a rate of 53 with no ST-T wave  abnormalities.   ASSESSMENT AND PLAN:  1. Coronary artery disease status post CABG. She is doing great. She      is a bit bradycardic and feels somewhat sluggish. I think it is      reasonable to wean down her beta blocker and see how she does. She      does not have a history of having a heart attack, so I feel fairly       comfortable with this.  2. Hyperlipidemia. She recently decreased her dose of Simvastatin to      20 mg from 40 mg. I told her that it is probably reasonable to stay      on 40 mg and keep her LDL under 70.  3. Hypertension. I suspect that she has a white coat hypertension      though we did discuss strategies to keep her blood pressure low      including weight loss and salt restriction. I have asked her to      keep a blood pressure log at home.   DISPOSITION:  We will see her back for routine follow up in one year. We  will get a baseline 2D echocardiogram. She knows to call me sooner if  she has any problems.     Bevelyn Buckles. Bensimhon, MD  Electronically Signed   DRB/MedQ  DD: 04/10/2007  DT: 04/11/2007  Job #: [id number]   cc:   Karie Schwalbe, MD

## 2010-11-08 NOTE — Assessment & Plan Note (Signed)
[facility] OFFICE NOTE   Denise Henson, Denise Henson                       MRN:          [id number]  DATE:04/29/2008                            DOB:          19-Apr-1946    PRIMARY CARE PHYSICIAN:  Karie Schwalbe, MD   INTERVAL HISTORY:  Denise Henson is a delightful 65 year old woman with a  history of one-vessel coronary artery disease status post bypass surgery  in 2007 with a LIMA to the LAD at [facility] in [place].  She also  has a history of borderline hypertension and hyperlipidemia.  Echocardiogram in October 2008, showed an EF of 55-65% with mild aortic  insufficiency.   She returns today for routine followup.  She is doing great.  She is  very active, no chest pain or shortness of breath.  Dr. Alphonsus Sias stopped  her Lopressor, there is some question about this worsening up, this is  producing fatigue for her.  She has not noticed much of a difference.  She also was previously on simvastatin 40, but had to drop it back down  to 20 due to severe myalgias in her arms.   She has not had any problem with heart failure symptoms.  She notes that  when she goes to [employer], her blood pressure is often 150/80 and she is  worried that she is developing overt hypertension.   CURRENT MEDICATIONS:  1. Simvastatin 20.  2. Multivitamin.  3. Calcium.  4. Glucosamine.  5. Aspirin 81.  6. Fish oil.  7. Coenzyme Q10.   PHYSICAL EXAMINATION:  GENERAL:  She is well-appearing in no acute  distress, ambulates around the clinic without any respiratory  difficulty.  Blood pressures initially is 152/82 with a followup 144/84,  pulse is 65, weight is 180.  HEENT:  Normal.  NECK:  Supple.  No JVD, carotids are 2+ bilaterally without any bruits.  There is no lymphadenopathy or thyromegaly.  CARDIAC:  Regular rate and rhythm.  No murmurs, rubs, or gallops.  LUNGS:  Clear.  ABDOMEN:  Soft, nontender, nondistended.  No  hepatosplenomegaly.  No  bruits.  No masses.  Good bowel sounds.  EXTREMITIES:  Warm with no cyanosis, clubbing, or edema, no rash.  NEURO:  Alert and oriented x3.  Cranial nerves II through XII are  intact.  Moves all 4 extremities without difficulty.  Affect is  pleasant.   ASSESSMENT AND PLAN:  1. Coronary artery disease, is stable.  No evidence of ischemia.      Continue current therapy.  2. Hypertension.  Blood pressure is moderately elevated.  We will      start her on ramipril 2.5 mg a day.  See how she does, we will      check a BMET in 1 week.  I did warn her that about 10% of people      will develop a cough with an ACE inhibitor and she should let us      know if this is a problem.   DISPOSITION:  We will see her back in several months  for followup.     Bevelyn Buckles. Bensimhon, MD  Electronically Signed    DRB/MedQ  DD: 04/29/2008  DT: 04/30/2008  Job #: [id number]

## 2010-12-13 ENCOUNTER — Encounter: Payer: Self-pay | Admitting: Cardiovascular Disease

## 2011-01-07 ENCOUNTER — Other Ambulatory Visit: Payer: Self-pay | Admitting: Internal Medicine

## 2011-01-09 NOTE — Telephone Encounter (Signed)
rx sent to pharmacy by e-script  

## 2011-02-16 ENCOUNTER — Other Ambulatory Visit: Payer: Self-pay | Admitting: Internal Medicine

## 2011-02-16 NOTE — Telephone Encounter (Signed)
rx sent to pharmacy by e-script  

## 2011-02-24 ENCOUNTER — Encounter: Payer: Self-pay | Admitting: Cardiovascular Disease

## 2011-02-28 ENCOUNTER — Encounter: Payer: Self-pay | Admitting: Cardiovascular Disease

## 2011-02-28 ENCOUNTER — Ambulatory Visit (INDEPENDENT_AMBULATORY_CARE_PROVIDER_SITE_OTHER): Payer: Medicare Other | Admitting: Cardiovascular Disease

## 2011-02-28 DIAGNOSIS — I2510 Atherosclerotic heart disease of native coronary artery without angina pectoris: Secondary | ICD-10-CM

## 2011-02-28 DIAGNOSIS — I359 Nonrheumatic aortic valve disorder, unspecified: Secondary | ICD-10-CM

## 2011-02-28 DIAGNOSIS — R0989 Other specified symptoms and signs involving the circulatory and respiratory systems: Secondary | ICD-10-CM

## 2011-02-28 DIAGNOSIS — I10 Essential (primary) hypertension: Secondary | ICD-10-CM

## 2011-02-28 DIAGNOSIS — E785 Hyperlipidemia, unspecified: Secondary | ICD-10-CM

## 2011-02-28 NOTE — Patient Instructions (Signed)
You are doing well. No medication changes were made. Please call us if you have new issues that need to be addressed before your next appt.  We will call you for a follow up Appt. In 12 months  

## 2011-02-28 NOTE — Progress Notes (Signed)
Patient ID: Denise Henson, female    DOB: 01-14-1946, 65 y.o.   MRN: [id number]  HPI Comments: 65 y/o woman with h/o 1-v CAD s/p CABG 2007, HTN, mild AI and HL. Mild bilateral carotid arterial disease, remote history of smoking for 20-30 years. Returns for routine f/u.   Doing well very active with no CP or SOB. She is very difficult to tends to her gardens and fish ponds. No complaints.   SBP typically 130-135.     Recent lipids  TC 159 mg/dL TG 409  HDL 53  LDL 85 ( this lab was on no cholesterol medication)  EKG shows normal sinus rhythm with rate 80 beats per minute with no significant ST or T wave changes.    Outpatient Encounter Prescriptions as of 02/28/2011  Medication Sig Dispense Refill  . albuterol (VENTOLIN HFA) 108 (90 BASE) MCG/ACT inhaler Inhale 2 puffs into the lungs every 6 (six) hours as needed.        Marland Kitchen aspirin 81 MG EC tablet Take 81 mg by mouth daily.        . calcium gluconate 500 MG tablet Take 500 mg by mouth daily.        . Glucosamine 500 MG CAPS Take 500 mg by mouth 2 (two) times daily.        Marland Kitchen losartan (COZAAR) 50 MG tablet TAKE 1 TABLET DAILY  90 tablet  3  . Lutein 6 MG CAPS Take 6 mg by mouth daily.        . Omega-3 Fatty Acids (FISH OIL) 1000 MG CAPS Take 1,000 mg by mouth 2 (two) times daily.        Marland Kitchen omeprazole (PRILOSEC) 20 MG capsule Take 20 mg by mouth daily. For persistent reflux symptoms.       . simvastatin (ZOCOR) 40 MG tablet TAKE ONE-HALF (1/2) TABLET DAILY OR AS DIRECTED  45 tablet  1  . DISCONTD: multivitamin (THERAGRAN) per tablet Take 1 tablet by mouth daily.          Review of Systems  Constitutional: Negative.   HENT: Negative.   Eyes: Negative.   Respiratory: Negative.   Cardiovascular: Negative.   Gastrointestinal: Negative.   Musculoskeletal: Negative.   Skin: Negative.   Neurological: Negative.   Hematological: Negative.   Psychiatric/Behavioral: Negative.   All other systems reviewed and are negative.    BP 136/84   Pulse 80  Ht 5\' 4"  (1.626 m)  Wt 192 lb 4 oz (87.204 kg)  BMI 33.00 kg/m2   Physical Exam  Nursing note and vitals reviewed. Constitutional: She is oriented to person, place, and time. She appears well-developed and well-nourished.  HENT:  Head: Normocephalic.  Nose: Nose normal.  Mouth/Throat: Oropharynx is clear and moist.  Eyes: Conjunctivae are normal. Pupils are equal, round, and reactive to light.  Neck: Normal range of motion. Neck supple. No JVD present. Carotid bruit is present.  Cardiovascular: Normal rate, regular rhythm, S1 normal, S2 normal and intact distal pulses.  Exam reveals no gallop and no friction rub.   Murmur heard.  Crescendo systolic murmur is present with a grade of 2/6  Pulmonary/Chest: Effort normal and breath sounds normal. No respiratory distress. She has no wheezes. She has no rales. She exhibits no tenderness.  Abdominal: Soft. Bowel sounds are normal. She exhibits no distension. There is no tenderness.  Musculoskeletal: Normal range of motion. She exhibits no edema and no tenderness.  Lymphadenopathy:    She has no cervical adenopathy.  Neurological: She is alert and oriented to person, place, and time. Coordination normal.  Skin: Skin is warm and dry. No rash noted. No erythema.  Psychiatric: She has a normal mood and affect. Her behavior is normal. Judgment and thought content normal.         Assessment and Plan

## 2011-02-28 NOTE — Assessment & Plan Note (Signed)
Mild bilateral carotid arterial disease bilaterally. We'll continue aggressive cholesterol management.

## 2011-02-28 NOTE — Assessment & Plan Note (Signed)
Currently with no symptoms of angina. No further workup at this time. Continue current medication regimen. 

## 2011-02-28 NOTE — Assessment & Plan Note (Signed)
Sclerosis with no stenosis by echocardiogram last year.

## 2011-02-28 NOTE — Assessment & Plan Note (Signed)
Blood pressure is well controlled on today's visit. No changes made to the medications. 

## 2011-04-24 ENCOUNTER — Other Ambulatory Visit: Payer: Self-pay | Admitting: Internal Medicine

## 2011-06-08 ENCOUNTER — Other Ambulatory Visit: Payer: Self-pay | Admitting: *Deleted

## 2011-06-08 MED ORDER — SIMVASTATIN 40 MG PO TABS: 40.0000 mg | ORAL_TABLET | Freq: Every day | ORAL | Status: DC

## 2011-06-08 NOTE — Telephone Encounter (Signed)
Spoke with patient and advised results rx sent to pharmacy by e-script  

## 2011-06-21 ENCOUNTER — Telehealth: Payer: Self-pay | Admitting: Internal Medicine

## 2011-06-21 NOTE — Telephone Encounter (Signed)
That would be fine Probably best since it is so far in advance to call about 10 days before their appts and we can set up specific time and date

## 2011-06-21 NOTE — Telephone Encounter (Signed)
Patient called and stated she has an appointment on 09/27/11 and her husband on 33/22/13 and wanted to know if they can get bloodwork Monday prior to their appointment.  Please advise.

## 2011-06-21 NOTE — Telephone Encounter (Signed)
Spoke with patient and advised results   

## 2011-08-24 ENCOUNTER — Other Ambulatory Visit: Payer: Self-pay | Admitting: Internal Medicine

## 2011-09-15 ENCOUNTER — Encounter: Payer: Medicare Other | Admitting: Internal Medicine

## 2011-09-21 ENCOUNTER — Other Ambulatory Visit (INDEPENDENT_AMBULATORY_CARE_PROVIDER_SITE_OTHER): Payer: Medicare Other

## 2011-09-21 DIAGNOSIS — I10 Essential (primary) hypertension: Secondary | ICD-10-CM | POA: Diagnosis not present

## 2011-09-21 DIAGNOSIS — E785 Hyperlipidemia, unspecified: Secondary | ICD-10-CM | POA: Diagnosis not present

## 2011-09-21 LAB — LIPID PANEL
Cholesterol: 139 mg/dL (ref 0–200)
HDL: 53.4 mg/dL (ref >39.00)
Triglycerides: 129 mg/dL (ref 0.0–149.0)

## 2011-09-21 LAB — BASIC METABOLIC PANEL
CO2: 27 mEq/L (ref 19–32)
Calcium: 9.1 mg/dL (ref 8.4–10.5)
Glucose, Bld: 98 mg/dL (ref 70–99)
Sodium: 139 mEq/L (ref 135–145)

## 2011-09-21 LAB — CBC WITH DIFFERENTIAL/PLATELET
Basophils Relative: 0.6 % (ref 0.0–3.0)
Eosinophils Relative: 6.3 % — ABNORMAL HIGH (ref 0.0–5.0)
HCT: 37 % (ref 36.0–46.0)
Hemoglobin: 12.4 g/dL (ref 12.0–15.0)
MCV: 92.2 fl (ref 78.0–100.0)
Monocytes Absolute: 0.3 10*3/uL (ref 0.1–1.0)
Neutrophils Relative %: 55.2 % (ref 43.0–77.0)
RBC: 4.01 Mil/uL (ref 3.87–5.11)
WBC: 4.5 10*3/uL (ref 4.5–10.5)

## 2011-09-21 LAB — HEPATIC FUNCTION PANEL
ALT: 30 U/L (ref 0–35)
Total Bilirubin: 0.2 mg/dL — ABNORMAL LOW (ref 0.3–1.2)

## 2011-09-25 ENCOUNTER — Other Ambulatory Visit: Payer: Medicare Other

## 2011-09-26 ENCOUNTER — Encounter: Payer: Self-pay | Admitting: Internal Medicine

## 2011-09-27 ENCOUNTER — Ambulatory Visit (INDEPENDENT_AMBULATORY_CARE_PROVIDER_SITE_OTHER): Payer: Medicare Other | Admitting: Internal Medicine

## 2011-09-27 ENCOUNTER — Encounter: Payer: Self-pay | Admitting: Internal Medicine

## 2011-09-27 VITALS — BP 128/78 | HR 70 | Temp 98.6°F | Ht 64.0 in | Wt 191.0 lb

## 2011-09-27 DIAGNOSIS — Z0000 Encounter for general adult medical examination without abnormal findings: Secondary | ICD-10-CM | POA: Diagnosis not present

## 2011-09-27 DIAGNOSIS — M25559 Pain in unspecified hip: Secondary | ICD-10-CM

## 2011-09-27 DIAGNOSIS — E785 Hyperlipidemia, unspecified: Secondary | ICD-10-CM

## 2011-09-27 DIAGNOSIS — I10 Essential (primary) hypertension: Secondary | ICD-10-CM | POA: Diagnosis not present

## 2011-09-27 DIAGNOSIS — M25552 Pain in left hip: Secondary | ICD-10-CM

## 2011-09-27 DIAGNOSIS — I2510 Atherosclerotic heart disease of native coronary artery without angina pectoris: Secondary | ICD-10-CM | POA: Diagnosis not present

## 2011-09-27 NOTE — Assessment & Plan Note (Signed)
BP Readings from Last 3 Encounters:  09/27/11 128/78  02/28/11 136/84  06/14/10 140/70   Good control Labs fine

## 2011-09-27 NOTE — Progress Notes (Signed)
Subjective:    Patient ID: Denise Henson, female    DOB: 1945/12/05, 66 y.o.   MRN: [id number]  HPI Here for Medicare wellness visit No new concerns No depression or anhedonia No falls or instability Exercises regularly Advanced directives reviewed  Has noticed that she can feel a sternal wire when she twists Doesn't remember any injury Still has some decreased sensation in left breast and sternum---notices it more when exercising (like "muscle stress")  No chest pain Stable DOE---like when walking quickly up a hill No dizziness or syncope Only has edema if very long plane ride---compression stockings do help Will occ be aware of heart beat---nothing new for her. Avoids caffeine  No heartburn No swallowing problems while on the med  Has noticed some hip and back pain---esp when travelling this past winter (different car and bed) Can still feel a "hard ball" in left hip. Will awaken her at times---better if lies on back Stiff after prolonged sitting--like in car  No myalgias on statin  Current Outpatient Prescriptions on File Prior to Visit  Medication Sig Dispense Refill  . aspirin 81 MG EC tablet Take 81 mg by mouth daily.        . calcium gluconate 500 MG tablet Take 500 mg by mouth daily.        . Glucosamine 500 MG CAPS Take 500 mg by mouth 2 (two) times daily.        Marland Kitchen losartan (COZAAR) 50 MG tablet TAKE 1 TABLET DAILY  90 tablet  3  . Omega-3 Fatty Acids (FISH OIL) 1000 MG CAPS Take 1,000 mg by mouth 2 (two) times daily.        Marland Kitchen omeprazole (PRILOSEC) 20 MG capsule TAKE 1 CAPSULE DAILY FOR PERSISTENT REFLUX SYMPTOMS  90 capsule  2  . simvastatin (ZOCOR) 40 MG tablet TAKE ONE-HALF (1/2) TABLET DAILY OR AS DIRECTED  45 tablet  0    Allergies  Allergen Reactions  . Codeine     REACTION: unknown  . Lisinopril     REACTION: cough  . Morphine     REACTION: unspecified    Past Medical History  Diagnosis Date  . Coronary artery disease     One-vessel; s/p  LIMA-LAD, [facility] 2007  . Hypertension   . Hyperlipidemia     Unable to tolerate simvastatin 40 due to myalgias  . Aortic insufficiency 10/08    Mild; echocardiogram showed EF 55-65% with mild AI  . GERD (gastroesophageal reflux disease)     Past Surgical History  Procedure Date  . Coronary artery bypass graft 5/07    Readmitted for post op pleural effusions  . Tonsillectomy   . Cesarean section   . Stress imaging 6/07    Negative EF 67%  . Doppler echocardiography     NV LV EF 1+ AI/MR  . Cholecystectomy     Family History  Problem Relation Age of Onset  . Heart attack Mother 7  . Heart attack Maternal Grandmother   . Cancer Neg Hx     Breast or colon  . Diabetes Neg Hx   . Hypertension Neg Hx     History   Social History  . Marital Status: Married    Spouse Name: N/A    Number of Children: 1  . Years of Education: N/A   Occupational History  . Teacher (Master's in Ed)     Retired   Social History Main Topics  . Smoking status: Former Engineer, civil (consulting)  date: 06/26/1994  . Smokeless tobacco: Never Used  . Alcohol Use: Yes  . Drug Use: No  . Sexually Active:    Other Topics Concern  . Not on file   Social History Narrative   Has living willHusband, then son [name], is health care POAWould accept resuscitation attemptsNo tube feeds if cognitively unaware   Review of Systems Appetite is fine Weight is stable Sleeps well in general     Objective:   Physical Exam  Constitutional: She is oriented to person, place, and time. She appears well-developed and well-nourished. No distress.  Neck: Normal range of motion. Neck supple. No thyromegaly present.  Cardiovascular: Normal rate, regular rhythm, normal heart sounds and intact distal pulses.  Exam reveals no gallop.   No murmur heard. Pulmonary/Chest: Effort normal and breath sounds normal. No respiratory distress. She has no wheezes. She has no rales.  Abdominal: Soft. There is no  tenderness.  Musculoskeletal: Normal range of motion. She exhibits no edema and no tenderness.       No hip abnormalities noted. Good ROM  Lymphadenopathy:    She has no cervical adenopathy.  Neurological: She is alert and oriented to person, place, and time.       President-- "Garry Heater, ?" [phone] [id number] Recall 2/3  Skin: No rash noted. No erythema.  Psychiatric: She has a normal mood and affect. Her behavior is normal. Judgment and thought content normal.          Assessment & Plan:

## 2011-09-27 NOTE — Assessment & Plan Note (Signed)
At goal  No problems with the med

## 2011-09-27 NOTE — Assessment & Plan Note (Signed)
No major findings ?early arthritis

## 2011-09-27 NOTE — Assessment & Plan Note (Signed)
No problems since CABG

## 2011-09-27 NOTE — Assessment & Plan Note (Signed)
I have personally reviewed the Medicare Annual Wellness questionnaire and have noted 1. The patient's medical and social history 2. Their use of alcohol, tobacco or illicit drugs 3. Their current medications and supplements 4. The patient's functional ability including ADL's, fall risks, home safety risks and hearing or visual             impairment. 5. Diet and physical activities 6. Evidence for depression or mood disorders  The patients weight, height, BMI and visual acuity have been recorded in the chart I have made referrals, counseling and provided education to the patient based review of the above and I have provided the pt with a written personalized care plan for preventive services.  I have provided you with a copy of your personalized plan for preventive services. Please take the time to review along with your updated medication list.  Due for mammo in fall Colonoscopy due next year No other concerns

## 2011-10-24 ENCOUNTER — Encounter: Payer: Self-pay | Admitting: Internal Medicine

## 2011-10-24 ENCOUNTER — Ambulatory Visit (INDEPENDENT_AMBULATORY_CARE_PROVIDER_SITE_OTHER): Payer: Medicare Other | Admitting: Internal Medicine

## 2011-10-24 VITALS — BP 140/70 | HR 65 | Temp 97.5°F | Wt 190.0 lb

## 2011-10-24 DIAGNOSIS — J301 Allergic rhinitis due to pollen: Secondary | ICD-10-CM | POA: Diagnosis not present

## 2011-10-24 DIAGNOSIS — J45909 Unspecified asthma, uncomplicated: Secondary | ICD-10-CM | POA: Diagnosis not present

## 2011-10-24 MED ORDER — PREDNISONE 20 MG PO TABS: 40.0000 mg | ORAL_TABLET | Freq: Every day | ORAL | Status: AC

## 2011-10-24 MED ORDER — AMOXICILLIN 500 MG PO TABS: 1000.0000 mg | ORAL_TABLET | Freq: Two times a day (BID) | ORAL | Status: AC

## 2011-10-24 MED ORDER — ALBUTEROL SULFATE HFA 108 (90 BASE) MCG/ACT IN AERS: 2.0000 | INHALATION_SPRAY | Freq: Three times a day (TID) | RESPIRATORY_TRACT | Status: AC | PRN

## 2011-10-24 NOTE — Assessment & Plan Note (Signed)
Seems to have infectious cause now Some wheezing that may be partially allergic  Will treat with amoxil Prednisone for 5 days Could consider montelukast if more allergy related symptoms in the future

## 2011-10-24 NOTE — Patient Instructions (Signed)
Please try loratadine 10mg  1-2 daily or fexofenadine 180mg  at bedtime----for your allergy symptoms

## 2011-10-24 NOTE — Progress Notes (Signed)
Subjective:    Patient ID: Denise Henson, female    DOB: Nov 02, 1945, 66 y.o.   MRN: [id number]  HPI Has always had allergies Started with itchy eyes, runny nose and throat symptoms for 3 weeks or so (tree season)  Now in chest Purulent sputum No fever Some SOB---with exertion or after coughing spell  No ear pain Allergy symptoms seem some better---mild throat itching only  Uses OTC antihistamine when needed---helps but sedates her Doesn't know name but was taking every 6 hours  Current Outpatient Prescriptions on File Prior to Visit  Medication Sig Dispense Refill  . aspirin 81 MG EC tablet Take 81 mg by mouth daily.        . calcium gluconate 500 MG tablet Take 500 mg by mouth daily.        . Glucosamine 500 MG CAPS Take 500 mg by mouth 2 (two) times daily.        Marland Kitchen losartan (COZAAR) 50 MG tablet TAKE 1 TABLET DAILY  90 tablet  3  . multivitamin-lutein (OCUVITE-LUTEIN) CAPS Take 1 capsule by mouth daily.      . Omega-3 Fatty Acids (FISH OIL) 1000 MG CAPS Take 1,000 mg by mouth 2 (two) times daily.        Marland Kitchen omeprazole (PRILOSEC) 20 MG capsule TAKE 1 CAPSULE DAILY FOR PERSISTENT REFLUX SYMPTOMS  90 capsule  2  . simvastatin (ZOCOR) 40 MG tablet TAKE ONE-HALF (1/2) TABLET DAILY OR AS DIRECTED  45 tablet  0    Allergies  Allergen Reactions  . Codeine     REACTION: unknown  . Lisinopril     REACTION: cough  . Morphine     REACTION: unspecified    Past Medical History  Diagnosis Date  . Coronary artery disease     One-vessel; s/p LIMA-LAD, [facility] 2007  . Hypertension   . Hyperlipidemia     Unable to tolerate simvastatin 40 due to myalgias  . Aortic insufficiency 10/08    Mild; echocardiogram showed EF 55-65% with mild AI  . GERD (gastroesophageal reflux disease)     Past Surgical History  Procedure Date  . Coronary artery bypass graft 5/07    Readmitted for post op pleural effusions  . Tonsillectomy   . Cesarean section   . Stress imaging 6/07   Negative EF 67%  . Doppler echocardiography     NV LV EF 1+ AI/MR  . Cholecystectomy     Family History  Problem Relation Age of Onset  . Heart attack Mother 4  . Heart attack Maternal Grandmother   . Cancer Neg Hx     Breast or colon  . Diabetes Neg Hx   . Hypertension Neg Hx     History   Social History  . Marital Status: Married    Spouse Name: N/A    Number of Children: 1  . Years of Education: N/A   Occupational History  . Teacher (Master's in Ed)     Retired   Social History Main Topics  . Smoking status: Former Smoker    Quit date: 06/26/1994  . Smokeless tobacco: Never Used  . Alcohol Use: Yes  . Drug Use: No  . Sexually Active:    Other Topics Concern  . Not on file   Social History Narrative   Has living willHusband, then son Denise Henson, is health care POAWould accept resuscitation attemptsNo tube feeds if cognitively unaware   Review of Systems No rash No nausea, vomiting or diarrhea  Objective:   Physical Exam  Constitutional: She appears well-developed and well-nourished. No distress.  HENT:  Mouth/Throat: Oropharynx is clear and moist. No oropharyngeal exudate.       No sinus tenderness Moderate nasal inflammation TMs normal  Neck: Normal range of motion. Neck supple. No thyromegaly present.  Pulmonary/Chest: Effort normal. No respiratory distress. She has wheezes. She has no rales.       Good air movement but mild increased expiratory phase and mild exp wheezes  Lymphadenopathy:    She has no cervical adenopathy.  Skin: No rash noted.          Assessment & Plan:

## 2011-10-24 NOTE — Assessment & Plan Note (Signed)
Seems to be taking 1st generation OTC antihistamine (from her stated frequency) but has some sedation Discussed non sedating options

## 2011-11-20 ENCOUNTER — Other Ambulatory Visit: Payer: Self-pay | Admitting: Internal Medicine

## 2012-01-17 ENCOUNTER — Other Ambulatory Visit: Payer: Self-pay | Admitting: Internal Medicine

## 2012-04-01 DIAGNOSIS — Z23 Encounter for immunization: Secondary | ICD-10-CM | POA: Diagnosis not present

## 2012-06-22 ENCOUNTER — Other Ambulatory Visit: Payer: Self-pay | Admitting: Internal Medicine

## 2012-09-04 DIAGNOSIS — H40009 Preglaucoma, unspecified, unspecified eye: Secondary | ICD-10-CM | POA: Diagnosis not present

## 2012-09-10 ENCOUNTER — Other Ambulatory Visit: Payer: Self-pay | Admitting: Internal Medicine

## 2012-09-20 ENCOUNTER — Other Ambulatory Visit: Payer: Self-pay | Admitting: Internal Medicine

## 2012-10-24 ENCOUNTER — Other Ambulatory Visit: Payer: Self-pay | Admitting: Internal Medicine

## 2012-10-24 DIAGNOSIS — E785 Hyperlipidemia, unspecified: Secondary | ICD-10-CM

## 2012-10-24 DIAGNOSIS — I10 Essential (primary) hypertension: Secondary | ICD-10-CM

## 2012-10-31 ENCOUNTER — Other Ambulatory Visit (INDEPENDENT_AMBULATORY_CARE_PROVIDER_SITE_OTHER): Payer: Medicare Other

## 2012-10-31 ENCOUNTER — Encounter: Payer: Self-pay | Admitting: *Deleted

## 2012-10-31 DIAGNOSIS — E785 Hyperlipidemia, unspecified: Secondary | ICD-10-CM

## 2012-10-31 DIAGNOSIS — I10 Essential (primary) hypertension: Secondary | ICD-10-CM | POA: Diagnosis not present

## 2012-10-31 LAB — COMPREHENSIVE METABOLIC PANEL
AST: 30 U/L (ref 0–37)
Alkaline Phosphatase: 62 U/L (ref 39–117)
BUN: 20 mg/dL (ref 6–23)
Creatinine, Ser: 1.1 mg/dL (ref 0.4–1.2)
Potassium: 4.2 mEq/L (ref 3.5–5.1)
Total Bilirubin: 0.7 mg/dL (ref 0.3–1.2)

## 2012-10-31 LAB — CBC WITH DIFFERENTIAL/PLATELET
Basophils Relative: 0.8 % (ref 0.0–3.0)
Eosinophils Relative: 6.7 % — ABNORMAL HIGH (ref 0.0–5.0)
Hemoglobin: 12.5 g/dL (ref 12.0–15.0)
Lymphocytes Relative: 32.4 % (ref 12.0–46.0)
Neutro Abs: 2.6 10*3/uL (ref 1.4–7.7)
Neutrophils Relative %: 51.1 % (ref 43.0–77.0)
RBC: 4.2 Mil/uL (ref 3.87–5.11)
WBC: 5 10*3/uL (ref 4.5–10.5)

## 2012-10-31 LAB — LIPID PANEL
Cholesterol: 137 mg/dL (ref 0–200)
LDL Cholesterol: 65 mg/dL (ref 0–99)
VLDL: 20.8 mg/dL (ref 0.0–40.0)

## 2012-11-05 ENCOUNTER — Ambulatory Visit (INDEPENDENT_AMBULATORY_CARE_PROVIDER_SITE_OTHER): Payer: Medicare Other | Admitting: Internal Medicine

## 2012-11-05 ENCOUNTER — Encounter: Payer: Self-pay | Admitting: Internal Medicine

## 2012-11-05 VITALS — BP 130/70 | HR 69 | Temp 98.3°F | Wt 186.0 lb

## 2012-11-05 DIAGNOSIS — Z1331 Encounter for screening for depression: Secondary | ICD-10-CM | POA: Diagnosis not present

## 2012-11-05 DIAGNOSIS — I2510 Atherosclerotic heart disease of native coronary artery without angina pectoris: Secondary | ICD-10-CM

## 2012-11-05 DIAGNOSIS — K219 Gastro-esophageal reflux disease without esophagitis: Secondary | ICD-10-CM

## 2012-11-05 DIAGNOSIS — E785 Hyperlipidemia, unspecified: Secondary | ICD-10-CM | POA: Diagnosis not present

## 2012-11-05 DIAGNOSIS — I10 Essential (primary) hypertension: Secondary | ICD-10-CM

## 2012-11-05 NOTE — Progress Notes (Signed)
Subjective:    Patient ID: Denise Henson, female    DOB: [birth year], 67 y.o.   MRN: [id number]  HPI Doing well  Has been walking daily with her husband--happy about this Has left heel pain---suspects she has plantar fasciitis Does use orthotics Goes back 6 months No NSAIDs-- she hasn't taken  Cholesterol levels now excellent off the statin with current diet Feels better Brings me in book "Grain Brain" to review  No chest pain  No SOB Had some mild orthostatic dizziness after working outside a lot. Better now. BP was low when she checked. No syncope No edema until the evening---- gone in AM  No allergy or asthma problems this spring Is on the omeprazole for her stomach---keeps things quiet  Current Outpatient Prescriptions on File Prior to Visit  Medication Sig Dispense Refill  . aspirin 81 MG EC tablet Take 81 mg by mouth daily.        . calcium gluconate 500 MG tablet Take 500 mg by mouth daily.        . Glucosamine 500 MG CAPS Take 500 mg by mouth 2 (two) times daily.        Marland Kitchen losartan (COZAAR) 50 MG tablet TAKE 1 TABLET DAILY  90 tablet  1  . multivitamin-lutein (OCUVITE-LUTEIN) CAPS Take 1 capsule by mouth daily.      . Omega-3 Fatty Acids (FISH OIL) 1000 MG CAPS Take 1,000 mg by mouth 2 (two) times daily.        Marland Kitchen omeprazole (PRILOSEC) 20 MG capsule TAKE 1 CAPSULE DAILY FOR PERSISTENT REFLUX SYMPTOMS  90 capsule  0   No current facility-administered medications on file prior to visit.    Allergies  Allergen Reactions  . Codeine     REACTION: unknown  . Lisinopril     REACTION: cough  . Morphine     REACTION: unspecified    Past Medical History  Diagnosis Date  . Coronary artery disease     One-vessel; s/p LIMA-LAD, [facility] 2007  . Hypertension   . Hyperlipidemia     Unable to tolerate simvastatin 40 due to myalgias  . Aortic insufficiency 10/08    Mild; echocardiogram showed EF 55-65% with mild AI  . GERD (gastroesophageal reflux disease)   .  Allergic rhinitis due to pollen     Past Surgical History  Procedure Laterality Date  . Coronary artery bypass graft  5/07    Readmitted for post op pleural effusions  . Tonsillectomy    . Cesarean section    . Stress imaging  6/07    Negative EF 67%  . Doppler echocardiography      NV LV EF 1+ AI/MR  . Cholecystectomy      Family History  Problem Relation Age of Onset  . Heart attack Mother 38  . Heart attack Maternal Grandmother   . Cancer Neg Hx     Breast or colon  . Diabetes Neg Hx   . Hypertension Neg Hx     History   Social History  . Marital Status: Married    Spouse Name: N/A    Number of Children: 1  . Years of Education: N/A   Occupational History  . Teacher (Master's in Ed)     Retired   Social History Main Topics  . Smoking status: Former Smoker    Quit date: 06/26/1994  . Smokeless tobacco: Never Used  . Alcohol Use: Yes  . Drug Use: No  . Sexually Active:  Other Topics Concern  . Not on file   Social History Narrative   Has living will   Husband, then son [name], is health care POA   Would accept resuscitation attempts   No tube feeds if cognitively unaware   Review of Systems Weight is down 4# from last year Using gluten free diet and high protein diet    Objective:   Physical Exam  Constitutional: She appears well-developed and well-nourished. No distress.  Neck: Normal range of motion. Neck supple. No thyromegaly present.  Cardiovascular: Normal rate, regular rhythm, normal heart sounds and intact distal pulses.  Exam reveals no gallop.   No murmur heard. Pulmonary/Chest: Effort normal and breath sounds normal. No respiratory distress. She has no wheezes. She has no rales.  Abdominal: Soft. There is no tenderness.  Musculoskeletal: She exhibits no edema and no tenderness.  Lymphadenopathy:    She has no cervical adenopathy.  Psychiatric: She has a normal mood and affect. Her behavior is normal.          Assessment  & Plan:

## 2012-11-05 NOTE — Assessment & Plan Note (Signed)
Lab Results  Component Value Date   LDLCALC 65 10/31/2012   Dietary changes have really helped cholesterol---even off the statin

## 2012-11-05 NOTE — Assessment & Plan Note (Signed)
Due to anatomic vessel disorder No current symptoms

## 2012-11-05 NOTE — Assessment & Plan Note (Signed)
BP Readings from Last 3 Encounters:  11/05/12 130/70  10/24/11 140/70  09/27/11 128/78   Good control Was low with dizziness briefly---if recurs with good exercise, may want to lower med Lab Results  Component Value Date   CREATININE 1.1 10/31/2012

## 2012-11-05 NOTE — Assessment & Plan Note (Signed)
Quiet on the med 

## 2012-11-05 NOTE — Patient Instructions (Signed)
Please try antiinflammatories for a couple of weeks and wear your best shoes. If the heel pain continues, please call for evaluation with Dr Patsy Lager here.

## 2012-11-07 ENCOUNTER — Encounter: Payer: Self-pay | Admitting: Internal Medicine

## 2012-11-22 ENCOUNTER — Other Ambulatory Visit: Payer: Self-pay

## 2012-11-22 MED ORDER — OMEPRAZOLE 20 MG PO CPDR: DELAYED_RELEASE_CAPSULE | ORAL | Status: DC

## 2012-11-22 NOTE — Telephone Encounter (Signed)
Pt left v/m requesting status of omeprazole refill requested in Mychart; pt request cb today with [facility]. Left v/m notifying pt refill done.

## 2013-01-29 ENCOUNTER — Other Ambulatory Visit: Payer: Self-pay

## 2013-03-30 ENCOUNTER — Other Ambulatory Visit: Payer: Self-pay | Admitting: Internal Medicine

## 2013-04-28 ENCOUNTER — Other Ambulatory Visit: Payer: Self-pay | Admitting: Internal Medicine

## 2013-05-01 ENCOUNTER — Other Ambulatory Visit: Payer: Self-pay

## 2013-07-27 ENCOUNTER — Other Ambulatory Visit: Payer: Self-pay | Admitting: Internal Medicine

## 2013-10-03 ENCOUNTER — Other Ambulatory Visit: Payer: Self-pay | Admitting: Internal Medicine

## 2013-10-15 DIAGNOSIS — H43819 Vitreous degeneration, unspecified eye: Secondary | ICD-10-CM | POA: Diagnosis not present

## 2014-01-01 ENCOUNTER — Other Ambulatory Visit: Payer: Self-pay | Admitting: Internal Medicine

## 2014-03-02 ENCOUNTER — Other Ambulatory Visit: Payer: Self-pay | Admitting: Internal Medicine

## 2014-04-01 ENCOUNTER — Other Ambulatory Visit: Payer: Self-pay | Admitting: Internal Medicine

## 2014-04-01 NOTE — Telephone Encounter (Signed)
Electronic Rx request for prilosec received. Patient last office visit 11/07/12. Rx last filled 01/01/14 with note to pharmacy "patient needs to be seen for further refills" No future appointment has been scheduled at that this time. Please advise.

## 2014-04-01 NOTE — Telephone Encounter (Signed)
Can give #[phone] x 0 pending an OV If she doesn't come in, she is going to have to get it OTC

## 2014-04-16 DIAGNOSIS — Z23 Encounter for immunization: Secondary | ICD-10-CM | POA: Diagnosis not present

## 2014-04-22 ENCOUNTER — Ambulatory Visit (INDEPENDENT_AMBULATORY_CARE_PROVIDER_SITE_OTHER): Payer: Medicare Other | Admitting: Internal Medicine

## 2014-04-22 ENCOUNTER — Encounter: Payer: Self-pay | Admitting: Internal Medicine

## 2014-04-22 VITALS — BP 126/64 | HR 64 | Temp 98.2°F | Wt 189.0 lb

## 2014-04-22 DIAGNOSIS — H6012 Cellulitis of left external ear: Secondary | ICD-10-CM

## 2014-04-22 MED ORDER — CEPHALEXIN 500 MG PO CAPS: 500.0000 mg | ORAL_CAPSULE | Freq: Four times a day (QID) | ORAL | Status: DC

## 2014-04-22 NOTE — Progress Notes (Signed)
Pre visit review using our clinic review tool, if applicable. No additional management support is needed unless otherwise documented below in the visit note. 

## 2014-04-22 NOTE — Patient Instructions (Addendum)

## 2014-04-22 NOTE — Progress Notes (Signed)
Subjective:    Patient ID: Denise Henson, female    DOB: 02/08/1946, 68 y.o.   MRN: [id number]  HPI  Pt presents to the clinic today with c/o left ear pain. She reports this started yesterday. Her is red and very itchy. She has noticed a rash on the left side of her cheek as well. She has used antibiotic ointment without relief. She denies any injury to the area. She has not had any bug bite that she is aware of. She denies fever, chills or body aches.  Review of Systems      Past Medical History  Diagnosis Date  . Coronary artery disease     One-vessel; s/p LIMA-LAD, [facility] 2007  . Hypertension   . Hyperlipidemia     Unable to tolerate simvastatin 40 due to myalgias  . Aortic insufficiency 10/08    Mild; echocardiogram showed EF 55-65% with mild AI  . GERD (gastroesophageal reflux disease)   . Allergic rhinitis due to pollen     Current Outpatient Prescriptions  Medication Sig Dispense Refill  . aspirin 81 MG EC tablet Take 81 mg by mouth daily.        . calcium gluconate 500 MG tablet Take 500 mg by mouth daily.        . Glucosamine 500 MG CAPS Take 500 mg by mouth 2 (two) times daily.        Marland Kitchen losartan (COZAAR) 50 MG tablet TAKE 1 TABLET DAILY  90 tablet  2  . multivitamin-lutein (OCUVITE-LUTEIN) CAPS Take 1 capsule by mouth daily.      . Omega-3 Fatty Acids (FISH OIL) 1000 MG CAPS Take 1,000 mg by mouth 2 (two) times daily.        Marland Kitchen omeprazole (PRILOSEC) 20 MG capsule TAKE 1 CAPSULE DAILY FOR PERSISTENT REFLUX SYMPTOMS (NEED APPOINTMENT)  30 capsule  0   No current facility-administered medications for this visit.    Allergies  Allergen Reactions  . Codeine     REACTION: unknown  . Lisinopril     REACTION: cough  . Morphine     REACTION: unspecified    Family History  Problem Relation Age of Onset  . Heart attack Mother 30  . Heart attack Maternal Grandmother   . Cancer Neg Hx     Breast or colon  . Diabetes Neg Hx   . Hypertension Neg Hx      History   Social History  . Marital Status: Married    Spouse Name: N/A    Number of Children: 1  . Years of Education: N/A   Occupational History  . 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 Smoking status: Former Smoker    Quit date: 06/26/1994  . Smokeless tobacco: Never Used  . Alcohol Use: Yes  . Drug Use: No  . Sexual Activity:    Other Topics Concern  . Not on file   Social History Narrative   Has living will   Husband, then son Denise Henson, is health care POA   Would accept resuscitation attempts   No tube feeds if cognitively unaware     Constitutional: Denies fever, malaise, fatigue, headache or abrupt weight changes.  HEENT: Pt reports ear pain. Denies eye pain, eye redness, ringing in the ears, wax buildup, runny nose, nasal congestion, bloody nose, or sore throat. Skin: Pt reports left ear redness. Denies lesions or ulcercations.   No other specific complaints  in a complete review of systems (except as listed in HPI above).  Objective:   Physical Exam   BP 126/64  Pulse 64  Temp(Src) 98.2 F (36.8 C) (Oral)  Wt 189 lb (85.73 kg)  SpO2 97%  Wt Readings from Last 3 Encounters:  04/22/14 189 lb (85.73 kg)  11/05/12 186 lb (84.369 kg)  10/24/11 190 lb (86.183 kg)    General: Appears her stated age, well developed, well nourished in NAD. Skin: Warm, dry and intact. Left pinna swollen, red and warm. No ulceration noted. No drainage noted. HEENT: Head: normal shape and size; Ears:  Canal red but not swollen. Tm's gray and intact, normal light reflex; No pain with palpation of the left tragus, but pain when you pull on the pinna.  Cardiovascular: Normal rate and rhythm. S1,S2 noted.  No murmur, rubs or gallops noted. No JVD or BLE edema. No carotid bruits noted. Pulmonary/Chest: Normal effort and positive vesicular breath sounds. No respiratory distress. No wheezes, rales or ronchi noted.   BMET    Component Value  Date/Time   NA 138 10/31/2012 0811   K 4.2 10/31/2012 0811   CL 104 10/31/2012 0811   CO2 25 10/31/2012 0811   GLUCOSE 92 10/31/2012 0811   BUN 20 10/31/2012 0811   CREATININE 1.1 10/31/2012 0811   CALCIUM 9.3 10/31/2012 0811   GFRNONAA 61.27 02/21/2010 0846   GFRAA 81 05/14/2008 0907    Lipid Panel     Component Value Date/Time   CHOL 137 10/31/2012 0811   TRIG 104.0 10/31/2012 0811   HDL 50.80 10/31/2012 0811   CHOLHDL 3 10/31/2012 0811   VLDL 20.8 10/31/2012 0811   LDLCALC 65 10/31/2012 0811    CBC    Component Value Date/Time   WBC 5.0 10/31/2012 0811   RBC 4.20 10/31/2012 0811   HGB 12.5 10/31/2012 0811   HCT 37.1 10/31/2012 0811   PLT 252.0 10/31/2012 0811   MCV 88.5 10/31/2012 0811   MCHC 33.6 10/31/2012 0811   RDW 15.0* 10/31/2012 0811   LYMPHSABS 1.6 10/31/2012 0811   MONOABS 0.4 10/31/2012 0811   EOSABS 0.3 10/31/2012 0811   BASOSABS 0.0 10/31/2012 0811    Hgb A1C No results found for this basename: HGBA1C        Assessment & Plan:   Cellulitis of left ear:  She has eczema in her scalp. It is likely that while scratching that, she scratched her ear causing and entrance for the bacteria eRx for keflex QID x 10 days Ibuprofen for inflammation and pain Watch for increased redness, swelling or drainage from the ear  RTC as needed or if symptoms persist or worsen

## 2014-05-27 ENCOUNTER — Encounter: Payer: Self-pay | Admitting: Internal Medicine

## 2014-05-27 ENCOUNTER — Telehealth: Payer: Self-pay | Admitting: Internal Medicine

## 2014-05-27 NOTE — Telephone Encounter (Signed)
Patient made an appointment for her cpx on 09/24/13.  Patient is insisting on having her lab work done the week before her appointment.  I let her know you usually do lab work on the same day as the cpx, but she said you're use to her peculiarities. I scheduled her appointment for lab work on 09/19/13.

## 2014-05-27 NOTE — Telephone Encounter (Signed)
That is fine I just need a reminder before---Denise Henson usually does that

## 2014-05-28 MED ORDER — CIPROFLOXACIN HCL 250 MG PO TABS: 250.0000 mg | ORAL_TABLET | Freq: Two times a day (BID) | ORAL | Status: DC

## 2014-05-28 MED ORDER — ACETAZOLAMIDE 125 MG PO TABS: 125.0000 mg | ORAL_TABLET | Freq: Three times a day (TID) | ORAL | Status: DC

## 2014-06-25 ENCOUNTER — Ambulatory Visit (INDEPENDENT_AMBULATORY_CARE_PROVIDER_SITE_OTHER): Payer: Medicare Other | Admitting: Internal Medicine

## 2014-06-25 ENCOUNTER — Encounter: Payer: Self-pay | Admitting: Internal Medicine

## 2014-06-25 VITALS — BP 124/70 | HR 84 | Temp 98.4°F | Wt 191.0 lb

## 2014-06-25 DIAGNOSIS — R6889 Other general symptoms and signs: Secondary | ICD-10-CM | POA: Diagnosis not present

## 2014-06-25 DIAGNOSIS — B349 Viral infection, unspecified: Secondary | ICD-10-CM

## 2014-06-25 MED ORDER — ALBUTEROL SULFATE HFA 108 (90 BASE) MCG/ACT IN AERS: 2.0000 | INHALATION_SPRAY | Freq: Four times a day (QID) | RESPIRATORY_TRACT | Status: DC | PRN

## 2014-06-25 NOTE — Progress Notes (Signed)
HPI  Pt presents to the clinic today with c/o sore throat, cough, shortness of breath, fever, chills and body aches. She rerots this started 5 days. She is not blowing anything out her nose. The cough is non productive. She has tried ASA without any relief. EMS came to her house last night, they assessed her but did not transport her to the hospital. She does have a history of allergies. She has had sick contacts. She has had her flu shot.   Past Medical History  Diagnosis Date  . Coronary artery disease     One-vessel; s/p LIMA-LAD, [facility] 2007  . Hypertension   . Hyperlipidemia     Unable to tolerate simvastatin 40 due to myalgias  . Aortic insufficiency 10/08    Mild; echocardiogram showed EF 55-65% with mild AI  . GERD (gastroesophageal reflux disease)   . Allergic rhinitis due to pollen     Current Outpatient Prescriptions  Medication Sig Dispense Refill  . acetaZOLAMIDE (DIAMOX) 125 MG tablet Take 1 tablet (125 mg total) by mouth 3 (three) times daily. 20 tablet 0  . aspirin 81 MG EC tablet Take 81 mg by mouth daily.      . calcium gluconate 500 MG tablet Take 500 mg by mouth daily.      . Glucosamine 500 MG CAPS Take 500 mg by mouth 2 (two) times daily.      Marland Kitchen losartan (COZAAR) 50 MG tablet TAKE 1 TABLET DAILY 90 tablet 2  . multivitamin-lutein (OCUVITE-LUTEIN) CAPS Take 1 capsule by mouth daily.    . Omega-3 Fatty Acids (FISH OIL) 1000 MG CAPS Take 1,000 mg by mouth 2 (two) times daily.      Marland Kitchen omeprazole (PRILOSEC) 20 MG capsule TAKE 1 CAPSULE DAILY FOR PERSISTENT REFLUX SYMPTOMS (NEED APPOINTMENT) 30 capsule 0   No current facility-administered medications for this visit.    Allergies  Allergen Reactions  . Codeine     REACTION: unknown  . Lisinopril     REACTION: cough  . Morphine     REACTION: unspecified    Family History  Problem Relation Age of Onset  . Heart attack Mother 74  . Heart attack Maternal Grandmother   . Cancer Neg Hx     Breast or  colon  . Diabetes Neg Hx   . Hypertension Neg Hx     History   Social History  . Marital Status: Married    Spouse Name: N/A    Number of Children: 1  . Years of Education: N/A   Occupational History  . 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 Smoking status: Former Smoker    Quit date: 06/26/1994  . Smokeless tobacco: Never Used  . Alcohol Use: Yes  . Drug Use: No  . Sexual Activity: Not on file   Other Topics Concern  . Not on file   Social History Narrative   Has living will   Husband, then son [name], is health care POA   Would accept resuscitation attempts   No tube feeds if cognitively unaware    ROS:  Constitutional: Pt reports headache, fever. Denies abrupt weight changes.  HEENT: Pt reports runny nose and sore throat. Denies eye pain, eye redness, ear pain, ringing in the ears, wax buildup, nasal congestion, bloody nose. Respiratory: Pt reports cough and shortness of breath.  Denies difficulty breathing or sputum production.   Cardiovascular: Denies chest pain, chest tightness,  palpitations or swelling in the hands or feet.  Gastrointestinal: Denies abdominal pain, bloating, constipation, diarrhea or blood in the stool.   No other specific complaints in a complete review of systems (except as listed in HPI above).  PE:  BP 124/70 mmHg  Pulse 84  Temp(Src) 98.4 F (36.9 C) (Oral)  Wt 191 lb (86.637 kg)  SpO2 97%  Wt Readings from Last 3 Encounters:  06/25/14 191 lb (86.637 kg)  04/22/14 189 lb (85.73 kg)  11/05/12 186 lb (84.369 kg)    General: Appears her stated age in NAD. HEENT: Head: normal shape and size, no sinus tenderness noted; Eyes: sclera injected on the right, no icterus, conjunctiva pink; Ears: Tm's gray and intact, normal light reflex; Nose: mucosa pink and moist, septum midline; Throat/Mouth: Teeth present, mucosa erythematous and moist. Ulcers noted on right tonsillar pillars.  Neck: No  lymphadenopathy noted. Cardiovascular: Normal rate and rhythm. S1,S2 noted.  No murmur, rubs or gallops noted.  Pulmonary/Chest: Normal effort and positive vesicular breath sounds. No respiratory distress. No wheezes, rales or ronchi noted.  Abdomen: Soft and nontender. Normal bowel sounds, no bruits noted. No distention or masses noted. Liver, spleen and kidneys non palpable.  BMET    Component Value Date/Time   NA 138 10/31/2012 0811   K 4.2 10/31/2012 0811   CL 104 10/31/2012 0811   CO2 25 10/31/2012 0811   GLUCOSE 92 10/31/2012 0811   BUN 20 10/31/2012 0811   CREATININE 1.1 10/31/2012 0811   CALCIUM 9.3 10/31/2012 0811   GFRNONAA 61.27 02/21/2010 0846   GFRAA 81 05/14/2008 0907    Lipid Panel     Component Value Date/Time   CHOL 137 10/31/2012 0811   TRIG 104.0 10/31/2012 0811   HDL 50.80 10/31/2012 0811   CHOLHDL 3 10/31/2012 0811   VLDL 20.8 10/31/2012 0811   LDLCALC 65 10/31/2012 0811    CBC    Component Value Date/Time   WBC 5.0 10/31/2012 0811   RBC 4.20 10/31/2012 0811   HGB 12.5 10/31/2012 0811   HCT 37.1 10/31/2012 0811   PLT 252.0 10/31/2012 0811   MCV 88.5 10/31/2012 0811   MCHC 33.6 10/31/2012 0811   RDW 15.0* 10/31/2012 0811   LYMPHSABS 1.6 10/31/2012 0811   MONOABS 0.4 10/31/2012 0811   EOSABS 0.3 10/31/2012 0811   BASOSABS 0.0 10/31/2012 0811    Hgb A1C No results found for: HGBA1C   Assessment and Plan:  Flu like symptoms:  Rapid flu: negative Symptomatic care at this time: fluids, rest, ibuprofen Salt water gargles for sore throat Delsym OTC for cough  Watch for continued fevers, colored mucous or worsening SOB- to UC/ER if symptoms persist

## 2014-06-25 NOTE — Patient Instructions (Signed)

## 2014-06-25 NOTE — Progress Notes (Signed)
Pre visit review using our clinic review tool, if applicable. No additional management support is needed unless otherwise documented below in the visit note. 

## 2014-08-27 ENCOUNTER — Other Ambulatory Visit: Payer: Self-pay | Admitting: Internal Medicine

## 2014-09-21 ENCOUNTER — Other Ambulatory Visit: Payer: Medicare Other

## 2014-09-22 ENCOUNTER — Other Ambulatory Visit: Payer: Self-pay | Admitting: Internal Medicine

## 2014-09-22 DIAGNOSIS — I10 Essential (primary) hypertension: Secondary | ICD-10-CM

## 2014-09-23 ENCOUNTER — Other Ambulatory Visit (INDEPENDENT_AMBULATORY_CARE_PROVIDER_SITE_OTHER): Payer: Medicare Other

## 2014-09-23 DIAGNOSIS — I10 Essential (primary) hypertension: Secondary | ICD-10-CM | POA: Diagnosis not present

## 2014-09-23 LAB — COMPREHENSIVE METABOLIC PANEL
ALK PHOS: 67 U/L (ref 39–117)
ALT: 25 U/L (ref 0–35)
AST: 24 U/L (ref 0–37)
Albumin: 4 g/dL (ref 3.5–5.2)
BUN: 27 mg/dL — AB (ref 6–23)
CALCIUM: 9.3 mg/dL (ref 8.4–10.5)
CHLORIDE: 106 meq/L (ref 96–112)
CO2: 27 mEq/L (ref 19–32)
CREATININE: 0.95 mg/dL (ref 0.40–1.20)
GFR: 61.9 mL/min (ref >60.00)
GLUCOSE: 106 mg/dL — AB (ref 70–99)
Potassium: 4.8 mEq/L (ref 3.5–5.1)
Sodium: 138 mEq/L (ref 135–145)
TOTAL PROTEIN: 6.9 g/dL (ref 6.0–8.3)
Total Bilirubin: 0.4 mg/dL (ref 0.2–1.2)

## 2014-09-23 LAB — LIPID PANEL
Cholesterol: 173 mg/dL (ref 0–200)
HDL: 56.4 mg/dL (ref >39.00)
LDL Cholesterol: 98 mg/dL (ref 0–99)
NonHDL: 116.6
TRIGLYCERIDES: 93 mg/dL (ref 0.0–149.0)
Total CHOL/HDL Ratio: 3
VLDL: 18.6 mg/dL (ref 0.0–40.0)

## 2014-09-23 LAB — T4, FREE: Free T4: 0.93 ng/dL (ref 0.60–1.60)

## 2014-09-23 LAB — CBC WITH DIFFERENTIAL/PLATELET
BASOS PCT: 0.6 % (ref 0.0–3.0)
Basophils Absolute: 0 10*3/uL (ref 0.0–0.1)
Eosinophils Absolute: 0.3 10*3/uL (ref 0.0–0.7)
Eosinophils Relative: 4 % (ref 0.0–5.0)
HCT: 35.9 % — ABNORMAL LOW (ref 36.0–46.0)
Hemoglobin: 11.9 g/dL — ABNORMAL LOW (ref 12.0–15.0)
Lymphocytes Relative: 21.6 % (ref 12.0–46.0)
Lymphs Abs: 1.4 10*3/uL (ref 0.7–4.0)
MCHC: 33.2 g/dL (ref 30.0–36.0)
MCV: 86.5 fl (ref 78.0–100.0)
MONO ABS: 0.5 10*3/uL (ref 0.1–1.0)
MONOS PCT: 7.9 % (ref 3.0–12.0)
NEUTROS ABS: 4.2 10*3/uL (ref 1.4–7.7)
NEUTROS PCT: 65.9 % (ref 43.0–77.0)
Platelets: 251 10*3/uL (ref 150.0–400.0)
RBC: 4.16 Mil/uL (ref 3.87–5.11)
RDW: 16 % — AB (ref 11.5–15.5)
WBC: 6.4 10*3/uL (ref 4.0–10.5)

## 2014-09-25 ENCOUNTER — Ambulatory Visit (INDEPENDENT_AMBULATORY_CARE_PROVIDER_SITE_OTHER): Payer: Medicare Other | Admitting: Internal Medicine

## 2014-09-25 ENCOUNTER — Encounter: Payer: Self-pay | Admitting: Internal Medicine

## 2014-09-25 ENCOUNTER — Telehealth: Payer: Self-pay | Admitting: Internal Medicine

## 2014-09-25 VITALS — BP 142/80 | HR 67 | Temp 98.2°F | Ht 64.0 in | Wt 187.8 lb

## 2014-09-25 DIAGNOSIS — D649 Anemia, unspecified: Secondary | ICD-10-CM | POA: Diagnosis not present

## 2014-09-25 DIAGNOSIS — K219 Gastro-esophageal reflux disease without esophagitis: Secondary | ICD-10-CM

## 2014-09-25 DIAGNOSIS — J301 Allergic rhinitis due to pollen: Secondary | ICD-10-CM | POA: Diagnosis not present

## 2014-09-25 DIAGNOSIS — I10 Essential (primary) hypertension: Secondary | ICD-10-CM | POA: Diagnosis not present

## 2014-09-25 DIAGNOSIS — Z23 Encounter for immunization: Secondary | ICD-10-CM | POA: Diagnosis not present

## 2014-09-25 DIAGNOSIS — Z0000 Encounter for general adult medical examination without abnormal findings: Secondary | ICD-10-CM

## 2014-09-25 DIAGNOSIS — Z7189 Other specified counseling: Secondary | ICD-10-CM

## 2014-09-25 MED ORDER — TRIAMCINOLONE ACETONIDE 0.1 % EX CREA: 1.0000 "application " | TOPICAL_CREAM | Freq: Two times a day (BID) | CUTANEOUS | Status: DC | PRN

## 2014-09-25 NOTE — Assessment & Plan Note (Signed)
BP Readings from Last 3 Encounters:  09/25/14 142/80  06/25/14 124/70  04/22/14 126/64   Reasonable control for age No changes

## 2014-09-25 NOTE — Progress Notes (Signed)
Subjective:    Patient ID: Denise Henson, female    DOB: [birth year], 69 y.o.   MRN: [id number]  HPI Here for Medicare wellness and follow up Reviewed form and advanced directives Only sees eye doctor and dentist   Has rash on left foot---can't get rid of it Itchy at times Tried OTC cortisone cream without help Started 6 months or so and stable  With recent respiratory infection she had severe episode of throat congestion Couldn't breathe and called 911 Was better by then Still with abnormal throat and voice Takes loratadine occasionally for drainage Omeprazole controls her heartburn No dysphagia No falls  No depression or anhedonia  1-2 drinks a day--wine or scotch Tries to exercise or be active regularly Vision and hearing are fine Independent with instrumental ADLs No cognitive changes that are out of the ordinary  No chest pain or angina No SOB or change in exercise tolerance Still aware of sternum since CABG No palpitations unless she drinks too much caffeine Mild ankle edema at end of day--better in AM No dizziness or syncope  No problems with cholesterol LDL under 100 without meds  Current Outpatient Prescriptions on File Prior to Visit  Medication Sig Dispense Refill  . aspirin 81 MG EC tablet Take 81 mg by mouth daily.      . calcium gluconate 500 MG tablet Take 500 mg by mouth daily.      . Glucosamine 500 MG CAPS Take 500 mg by mouth 2 (two) times daily.      Marland Kitchen losartan (COZAAR) 50 MG tablet TAKE 1 TABLET DAILY 90 tablet 2  . multivitamin-lutein (OCUVITE-LUTEIN) CAPS Take 1 capsule by mouth daily.    . Omega-3 Fatty Acids (FISH OIL) 1000 MG CAPS Take 1,000 mg by mouth 2 (two) times daily.      Marland Kitchen omeprazole (PRILOSEC) 20 MG capsule TAKE 1 CAPSULE DAILY FOR PERSISTENT REFLUX SYMPTOMS (NEED APPOINTMENT) 90 capsule 3   No current facility-administered medications on file prior to visit.    Allergies  Allergen Reactions  . Codeine     REACTION: unknown    . Lisinopril     REACTION: cough  . Morphine     REACTION: unspecified    Past Medical History  Diagnosis Date  . Coronary artery disease     One-vessel; s/p LIMA-LAD, [facility] 2007  . Hypertension   . Hyperlipidemia     Unable to tolerate simvastatin 40 due to myalgias  . Aortic insufficiency 10/08    Mild; echocardiogram showed EF 55-65% with mild AI  . GERD (gastroesophageal reflux disease)   . Allergic rhinitis due to pollen     Past Surgical History  Procedure Laterality Date  . Coronary artery bypass graft  5/07    Readmitted for post op pleural effusions  . Tonsillectomy    . Cesarean section    . Stress imaging  6/07    Negative EF 67%  . Doppler echocardiography      NV LV EF 1+ AI/MR  . Cholecystectomy      Family History  Problem Relation Age of Onset  . Heart attack Mother 84  . Heart attack Maternal Grandmother   . Cancer Neg Hx     Breast or colon  . Diabetes Neg Hx   . Hypertension Neg Hx     History   Social History  . Marital Status: Married    Spouse Name: N/A  . Number of Children: 1  . Years of Education:  N/A   Occupational History  . 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 Smoking status: Former Smoker    Quit date: 06/26/1994  . Smokeless tobacco: Never Used  . Alcohol Use: Yes  . Drug Use: No  . Sexual Activity: Not on file   Other Topics Concern  . Not on file   Social History Narrative   Has living will   Son [name], is health care POA   Would accept resuscitation attempts   No tube feeds if cognitively unaware   Review of Systems Appetite is fine Weight fairly stable Bowels are fine Regular with dentist Wears seat belt     Objective:   Physical Exam  Constitutional: She is oriented to person, place, and time. She appears well-developed and well-nourished. No distress.  HENT:  Mouth/Throat: Oropharynx is clear and moist. No oropharyngeal exudate.  Neck: Normal range  of motion. Neck supple. No thyromegaly present.  Cardiovascular: Normal rate, regular rhythm, normal heart sounds and intact distal pulses.  Exam reveals no gallop.   No murmur heard. Pulmonary/Chest: Effort normal and breath sounds normal. No respiratory distress. She has no wheezes. She has no rales.  Abdominal: Soft. There is no tenderness.  Musculoskeletal: She exhibits no edema or tenderness.  Lymphadenopathy:    She has no cervical adenopathy.  Neurological: She is alert and oriented to person, place, and time.  President-- "Elyn Peers, [place], [place]" [phone] [id number] Recall 3/3  Skin: No erythema.  Eczematous crusted area on top of right foot  Psychiatric: She has a normal mood and affect. Her behavior is normal.          Assessment & Plan:

## 2014-09-25 NOTE — Patient Instructions (Addendum)
Please set up your screening mammogram. Please try over the counter flonase or nasacort for the drainage. If it doesn't improve (and your throat also), set up an appointment with [facility].  DASH Eating Plan DASH stands for "Dietary Approaches to Stop Hypertension." The DASH eating plan is a healthy eating plan that has been shown to reduce high blood pressure (hypertension). Additional health benefits may include reducing the risk of type 2 diabetes mellitus, heart disease, and stroke. The DASH eating plan may also help with weight loss. WHAT DO I NEED TO KNOW ABOUT THE DASH EATING PLAN? For the DASH eating plan, you will follow these general guidelines:  Choose foods with a percent daily value for sodium of less than 5% (as listed on the food label).  Use salt-free seasonings or herbs instead of table salt or sea salt.  Check with your health care provider or pharmacist before using salt substitutes.  Eat lower-sodium products, often labeled as "lower sodium" or "no salt added."  Eat fresh foods.  Eat more vegetables, fruits, and low-fat dairy products.  Choose whole grains. Look for the word "whole" as the first word in the ingredient list.  Choose fish and skinless chicken or [place] more often than red meat. Limit fish, poultry, and meat to 6 oz (170 g) each day.  Limit sweets, desserts, sugars, and sugary drinks.  Choose heart-healthy fats.  Limit cheese to 1 oz (28 g) per day.  Eat more home-cooked food and less restaurant, buffet, and fast food.  Limit fried foods.  Cook foods using methods other than frying.  Limit canned vegetables. If you do use them, rinse them well to decrease the sodium.  When eating at a restaurant, ask that your food be prepared with less salt, or no salt if possible. WHAT FOODS CAN I EAT? Seek help from a dietitian for individual calorie needs. Grains Whole grain or whole wheat bread. Brown rice. Whole grain or whole wheat pasta. Quinoa,  bulgur, and whole grain cereals. Low-sodium cereals. Corn or whole wheat flour tortillas. Whole grain cornbread. Whole grain crackers. Low-sodium crackers. Vegetables Fresh or frozen vegetables (raw, steamed, roasted, or grilled). Low-sodium or reduced-sodium tomato and vegetable juices. Low-sodium or reduced-sodium tomato sauce and paste. Low-sodium or reduced-sodium canned vegetables.  Fruits All fresh, canned (in natural juice), or frozen fruits. Meat and Other Protein Products Ground beef (85% or leaner), grass-fed beef, or beef trimmed of fat. Skinless chicken or [place]. Ground chicken or [place]. Pork trimmed of fat. All fish and seafood. Eggs. Dried beans, peas, or lentils. Unsalted nuts and seeds. Unsalted canned beans. Dairy Low-fat dairy products, such as skim or 1% milk, 2% or reduced-fat cheeses, low-fat ricotta or cottage cheese, or plain low-fat yogurt. Low-sodium or reduced-sodium cheeses. Fats and Oils Tub margarines without trans fats. Light or reduced-fat mayonnaise and salad dressings (reduced sodium). Avocado. Safflower, olive, or canola oils. Natural peanut or almond butter. Other Unsalted popcorn and pretzels. The items listed above may not be a complete list of recommended foods or beverages. Contact your dietitian for more options. WHAT FOODS ARE NOT RECOMMENDED? Grains White bread. White pasta. White rice. Refined cornbread. Bagels and croissants. Crackers that contain trans fat. Vegetables Creamed or fried vegetables. Vegetables in a cheese sauce. Regular canned vegetables. Regular canned tomato sauce and paste. Regular tomato and vegetable juices. Fruits Dried fruits. Canned fruit in light or heavy syrup. Fruit juice. Meat and Other Protein Products Fatty cuts of meat. Ribs, chicken wings, bacon, sausage, bologna,  salami, chitterlings, fatback, hot dogs, bratwurst, and packaged luncheon meats. Salted nuts and seeds. Canned beans with salt. Dairy Whole or 2% milk,  cream, half-and-half, and cream cheese. Whole-fat or sweetened yogurt. Full-fat cheeses or blue cheese. Nondairy creamers and whipped toppings. Processed cheese, cheese spreads, or cheese curds. Condiments Onion and garlic salt, seasoned salt, table salt, and sea salt. Canned and packaged gravies. Worcestershire sauce. Tartar sauce. Barbecue sauce. Teriyaki sauce. Soy sauce, including reduced sodium. Steak sauce. Fish sauce. Oyster sauce. Cocktail sauce. Horseradish. Ketchup and mustard. Meat flavorings and tenderizers. Bouillon cubes. Hot sauce. Tabasco sauce. Marinades. Taco seasonings. Relishes. Fats and Oils Butter, stick margarine, lard, shortening, ghee, and bacon fat. Coconut, palm kernel, or palm oils. Regular salad dressings. Other Pickles and olives. Salted popcorn and pretzels. The items listed above may not be a complete list of foods and beverages to avoid. Contact your dietitian for more information. WHERE CAN I FIND MORE INFORMATION? National Heart, Lung, and Blood Institute: travelstabloid.com Document Released: 06/01/2011 Document Revised: 10/27/2013 Document Reviewed: 04/16/2013 [facility] Patient Information 2015 [place], [place]. This information is not intended to replace advice given to you by your health care provider. Make sure you discuss any questions you have with your health care provider.

## 2014-09-25 NOTE — Assessment & Plan Note (Signed)
Controlled on the PPI

## 2014-09-25 NOTE — Assessment & Plan Note (Signed)
I have personally reviewed the Medicare Annual Wellness questionnaire and have noted 1. The patient's medical and social history 2. Their use of alcohol, tobacco or illicit drugs 3. Their current medications and supplements 4. The patient's functional ability including ADL's, fall risks, home safety risks and hearing or visual             impairment. 5. Diet and physical activities 6. Evidence for depression or mood disorders  The patients weight, height, BMI and visual acuity have been recorded in the chart I have made referrals, counseling and provided education to the patient based review of the above and I have provided the pt with a written personalized care plan for preventive services.  I have provided you with a copy of your personalized plan for preventive services. Please take the time to review along with your updated medication list.  Will set up screening mammogram prevnar today Fecal immunoassay No pap due to age

## 2014-09-25 NOTE — Assessment & Plan Note (Signed)
Probably the cause of her throat symptoms Try nasal steroid---ENT if not better

## 2014-09-25 NOTE — Addendum Note (Signed)
Addended by: Amado Coe on: 09/25/2014 12:29 PM   Modules accepted: Orders

## 2014-09-25 NOTE — Progress Notes (Signed)
Pre visit review using our clinic review tool, if applicable. No additional management support is needed unless otherwise documented below in the visit note. 

## 2014-09-25 NOTE — Assessment & Plan Note (Signed)
See social history 

## 2014-09-25 NOTE — Assessment & Plan Note (Signed)
Borderline She was not excited about colon cancer screening but does agree to fecal immunoassay given the anemia

## 2014-09-25 NOTE — Telephone Encounter (Signed)
emmi emailed °

## 2014-10-07 DIAGNOSIS — Z1231 Encounter for screening mammogram for malignant neoplasm of breast: Secondary | ICD-10-CM | POA: Diagnosis not present

## 2014-10-09 ENCOUNTER — Encounter: Payer: Self-pay | Admitting: Internal Medicine

## 2014-10-19 DIAGNOSIS — H269 Unspecified cataract: Secondary | ICD-10-CM | POA: Diagnosis not present

## 2014-11-07 ENCOUNTER — Other Ambulatory Visit: Payer: Self-pay | Admitting: Internal Medicine

## 2015-04-22 DIAGNOSIS — Z23 Encounter for immunization: Secondary | ICD-10-CM | POA: Diagnosis not present

## 2015-07-02 ENCOUNTER — Telehealth: Payer: Self-pay | Admitting: Internal Medicine

## 2015-07-02 MED ORDER — OMEPRAZOLE 20 MG PO CPDR: 20.0000 mg | DELAYED_RELEASE_CAPSULE | Freq: Every day | ORAL | Status: DC

## 2015-07-02 NOTE — Telephone Encounter (Signed)
Pt needs refill -  Omeprazole  Needs to switch to a new pharmacy [facility]  - member id - [id number] Phone number  - [phone] 3 mo quantity  Last filled 05/04/15 Due to be filled 08/04/15 cb for pt is [phone]

## 2015-07-02 NOTE — Telephone Encounter (Signed)
rx sent to pharmacy by e-script  

## 2015-10-08 ENCOUNTER — Other Ambulatory Visit: Payer: Self-pay | Admitting: Internal Medicine

## 2015-10-08 DIAGNOSIS — I10 Essential (primary) hypertension: Secondary | ICD-10-CM

## 2015-10-14 ENCOUNTER — Ambulatory Visit (INDEPENDENT_AMBULATORY_CARE_PROVIDER_SITE_OTHER): Payer: Medicare Other

## 2015-10-14 ENCOUNTER — Other Ambulatory Visit: Payer: Medicare Other

## 2015-10-14 VITALS — BP 122/70 | HR 66 | Temp 98.0°F | Ht 64.0 in | Wt 195.2 lb

## 2015-10-14 DIAGNOSIS — Z23 Encounter for immunization: Secondary | ICD-10-CM | POA: Diagnosis not present

## 2015-10-14 DIAGNOSIS — Z1159 Encounter for screening for other viral diseases: Secondary | ICD-10-CM

## 2015-10-14 DIAGNOSIS — Z0000 Encounter for general adult medical examination without abnormal findings: Secondary | ICD-10-CM | POA: Diagnosis not present

## 2015-10-14 DIAGNOSIS — I10 Essential (primary) hypertension: Secondary | ICD-10-CM

## 2015-10-14 LAB — LIPID PANEL
Cholesterol: 188 mg/dL (ref 0–200)
HDL: 54.6 mg/dL (ref >39.00)
NONHDL: 133.71
TRIGLYCERIDES: 246 mg/dL — AB (ref 0.0–149.0)
Total CHOL/HDL Ratio: 3
VLDL: 49.2 mg/dL — ABNORMAL HIGH (ref 0.0–40.0)

## 2015-10-14 LAB — CBC WITH DIFFERENTIAL/PLATELET
BASOS PCT: 0.5 % (ref 0.0–3.0)
Basophils Absolute: 0 10*3/uL (ref 0.0–0.1)
EOS ABS: 0.4 10*3/uL (ref 0.0–0.7)
Eosinophils Relative: 5.5 % — ABNORMAL HIGH (ref 0.0–5.0)
HEMATOCRIT: 36.5 % (ref 36.0–46.0)
Hemoglobin: 12.3 g/dL (ref 12.0–15.0)
LYMPHS PCT: 23.6 % (ref 12.0–46.0)
Lymphs Abs: 1.7 10*3/uL (ref 0.7–4.0)
MCHC: 33.6 g/dL (ref 30.0–36.0)
MCV: 90.6 fl (ref 78.0–100.0)
MONO ABS: 0.5 10*3/uL (ref 0.1–1.0)
Monocytes Relative: 7.5 % (ref 3.0–12.0)
NEUTROS ABS: 4.5 10*3/uL (ref 1.4–7.7)
Neutrophils Relative %: 62.9 % (ref 43.0–77.0)
PLATELETS: 245 10*3/uL (ref 150.0–400.0)
RBC: 4.03 Mil/uL (ref 3.87–5.11)
RDW: 15.1 % (ref 11.5–15.5)
WBC: 7.2 10*3/uL (ref 4.0–10.5)

## 2015-10-14 LAB — COMPREHENSIVE METABOLIC PANEL
ALT: 28 U/L (ref 0–35)
AST: 24 U/L (ref 0–37)
Albumin: 4.1 g/dL (ref 3.5–5.2)
Alkaline Phosphatase: 73 U/L (ref 39–117)
BUN: 24 mg/dL — AB (ref 6–23)
CHLORIDE: 104 meq/L (ref 96–112)
CO2: 27 meq/L (ref 19–32)
CREATININE: 0.88 mg/dL (ref 0.40–1.20)
Calcium: 9.6 mg/dL (ref 8.4–10.5)
GFR: 67.41 mL/min (ref >60.00)
GLUCOSE: 127 mg/dL — AB (ref 70–99)
Potassium: 4 mEq/L (ref 3.5–5.1)
SODIUM: 138 meq/L (ref 135–145)
Total Bilirubin: 0.4 mg/dL (ref 0.2–1.2)
Total Protein: 6.7 g/dL (ref 6.0–8.3)

## 2015-10-14 LAB — LDL CHOLESTEROL, DIRECT: Direct LDL: 64 mg/dL

## 2015-10-14 NOTE — Patient Instructions (Signed)
Ms. Lamond , Thank you for taking time to come for your Medicare Wellness Visit. I appreciate your ongoing commitment to your health goals. Please review the following plan we discussed and let me know if I can assist you in the future.   These are the goals we discussed: Goals    . Weight < 176 lb (79.833 kg)     Target Weight is 175lbs. Starting 10/14/2015, I will continue to exercise for at least 60 min daily.        This is a list of the screening recommended for you and due dates:  Health Maintenance  Topic Date Due  .  Hepatitis C: One time screening is recommended by Center for Disease Control  (CDC) for  adults born from [birth year] through 1966.   25-Feb-1946  . Pneumonia vaccines (2 of 2 - PPSV23) 09/25/2015  . Colon Cancer Screening  10/13/2017*  . Flu Shot  01/25/2016  . Tetanus Vaccine  03/20/2016  . Mammogram  10/06/2016  . DEXA scan (bone density measurement)  Completed  . Shingles Vaccine  Completed  *Topic was postponed. The date shown is not the original due date.      Preventive Care for Adults  A healthy lifestyle and preventive care can promote health and wellness. Preventive health guidelines for adults include the following key practices.  . A routine yearly physical is a good way to check with your health care provider about your health and preventive screening. It is a chance to share any concerns and updates on your health and to receive a thorough exam.  . Visit your dentist for a routine exam and preventive care every 6 months. Brush your teeth twice a day and floss once a day. Good oral hygiene prevents tooth decay and gum disease.  . The frequency of eye exams is based on your age, health, family medical history, use  of contact lenses, and other factors. Follow your health care provider's ecommendations for frequency of eye exams.  . Eat a healthy diet. Foods like vegetables, fruits, whole grains, low-fat dairy products, and lean protein foods contain  the nutrients you need without too many calories. Decrease your intake of foods high in solid fats, added sugars, and salt. Eat the right amount of calories for you. Get information about a proper diet from your health care provider, if necessary.  . Regular physical exercise is one of the most important things you can do for your health. Most adults should get at least 150 minutes of moderate-intensity exercise (any activity that increases your heart rate and causes you to sweat) each week. In addition, most adults need muscle-strengthening exercises on 2 or more days a week.  Silver Sneakers may be a benefit available to you. To determine eligibility, you may visit the website: www.silversneakers.com or contact program at 7601529050 Mon-Fri between 8AM-8PM.   . Maintain a healthy weight. The body mass index (BMI) is a screening tool to identify possible weight problems. It provides an estimate of body fat based on height and weight. Your health care provider can find your BMI and can help you achieve or maintain a healthy weight.   For adults 20 years and older: ? A BMI below 18.5 is considered underweight. ? A BMI of 18.5 to 24.9 is normal. ? A BMI of 25 to 29.9 is considered overweight. ? A BMI of 30 and above is considered obese.   . Maintain normal blood lipids and cholesterol levels by exercising and minimizing  your intake of saturated fat. Eat a balanced diet with plenty of fruit and vegetables. Blood tests for lipids and cholesterol should begin at age 17 and be repeated every 5 years. If your lipid or cholesterol levels are high, you are over 50, or you are at high risk for heart disease, you may need your cholesterol levels checked more frequently. Ongoing high lipid and cholesterol levels should be treated with medicines if diet and exercise are not working.  . If you smoke, find out from your health care provider how to quit. If you do not use tobacco, please do not start.  . If  you choose to drink alcohol, please do not consume more than 2 drinks per day. One drink is considered to be 12 ounces (355 mL) of beer, 5 ounces (148 mL) of wine, or 1.5 ounces (44 mL) of liquor.  . If you are 7-32 years old, ask your health care provider if you should take aspirin to prevent strokes.  . Use sunscreen. Apply sunscreen liberally and repeatedly throughout the day. You should seek shade when your shadow is shorter than you. Protect yourself by wearing long sleeves, pants, a wide-brimmed hat, and sunglasses year round, whenever you are outdoors.  . Once a month, do a whole body skin exam, using a mirror to look at the skin on your back. Tell your health care provider of new moles, moles that have irregular borders, moles that are larger than a pencil eraser, or moles that have changed in shape or color.

## 2015-10-14 NOTE — Progress Notes (Signed)
   Subjective:    Patient ID: Denise Henson, female    DOB: 1945/12/30, 70 y.o.   MRN: [id number]  HPI I reviewed health advisor's note, was available for consultation, and agree with documentation and plan.    Review of Systems     Objective:   Physical Exam        Assessment & Plan:

## 2015-10-14 NOTE — Progress Notes (Signed)
Pre visit review using our clinic review tool, if applicable. No additional management support is needed unless otherwise documented below in the visit note. 

## 2015-10-14 NOTE — Progress Notes (Signed)
Subjective:   Denise Henson is a 70 y.o. female who presents for Medicare Annual (Subsequent) preventive examination.   Cardiac Risk Factors include: advanced age (>1men, >28 women);hypertension;obesity (BMI >30kg/m2)     Objective:     Vitals: BP 122/70 mmHg  Pulse 66  Temp(Src) 98 F (36.7 C) (Oral)  Ht 5\' 4"  (1.626 m)  Wt 195 lb 4 oz (88.565 kg)  BMI 33.50 kg/m2  SpO2 95%  Body mass index is 33.5 kg/(m^2).   Tobacco History  Smoking status  . Former Smoker  . Quit date: 06/26/1994  Smokeless tobacco  . Never Used     Counseling given: No   Past Medical History  Diagnosis Date  . Coronary artery disease     One-vessel; s/p LIMA-LAD, [facility] 2007  . Hypertension   . Hyperlipidemia     Unable to tolerate simvastatin 40 due to myalgias  . Aortic insufficiency 10/08    Mild; echocardiogram showed EF 55-65% with mild AI  . GERD (gastroesophageal reflux disease)   . Allergic rhinitis due to pollen    Past Surgical History  Procedure Laterality Date  . Coronary artery bypass graft  5/07    Readmitted for post op pleural effusions  . Tonsillectomy    . Cesarean section    . Stress imaging  6/07    Negative EF 67%  . Doppler echocardiography      NV LV EF 1+ AI/MR  . Cholecystectomy     Family History  Problem Relation Age of Onset  . Heart attack Mother 41  . Heart attack Maternal Grandmother   . Cancer Neg Hx     Breast or colon  . Diabetes Neg Hx   . Hypertension Neg Hx    History  Sexual Activity  . Sexual Activity: No    Outpatient Encounter Prescriptions as of 10/14/2015  Medication Sig  . aspirin 81 MG EC tablet Take 81 mg by mouth daily.    . calcium gluconate 500 MG tablet Take 500 mg by mouth daily.    . Glucosamine 500 MG CAPS Take 500 mg by mouth 2 (two) times daily.    Marland Kitchen losartan (COZAAR) 50 MG tablet TAKE 1 TABLET DAILY  . multivitamin-lutein (OCUVITE-LUTEIN) CAPS Take 1 capsule by mouth daily.  . Omega-3 Fatty Acids  (FISH OIL) 1000 MG CAPS Take 1,000 mg by mouth 2 (two) times daily.    Marland Kitchen omeprazole (PRILOSEC) 20 MG capsule Take 1 capsule (20 mg total) by mouth daily.  Marland Kitchen triamcinolone cream (KENALOG) 0.1 % Apply 1 application topically 2 (two) times daily as needed.   No facility-administered encounter medications on file as of 10/14/2015.    Activities of Daily Living In your present state of health, do you have any difficulty performing the following activities: 10/14/2015  Hearing? N  Vision? N  Difficulty concentrating or making decisions? N  Walking or climbing stairs? N  Dressing or bathing? N  Doing errands, shopping? N  Preparing Food and eating ? N  Using the Toilet? N  In the past six months, have you accidently leaked urine? N  Do you have problems with loss of bowel control? N  Managing your Medications? N  Managing your Finances? N  Housekeeping or managing your Housekeeping? N    Patient Care Team: Venia Carbon, MD as PCP - General Eulogio Bear, MD as Consulting Physician (Ophthalmology)    Assessment:     Hearing Screening   125Hz  250Hz  500Hz   1000Hz  2000Hz  4000Hz  8000Hz   Right ear:   40 40 40 40   Left ear:   40 40 40 40   Vision Screening Comments: Last eye exam in April 2016   Exercise Activities and Dietary recommendations Current Exercise Habits: Home exercise routine, Type of exercise: walking;Other - see comments (gardening), Time (Minutes): 60, Frequency (Times/Week): 7, Weekly Exercise (Minutes/Week): 420, Intensity: Moderate, Exercise limited by: None identified  Goals    . Weight < 176 lb (79.833 kg)     Target Weight is 175lbs. Starting 10/14/2015, I will continue to exercise for at least 60 min daily.       Fall Risk Fall Risk  10/14/2015 09/25/2014 11/05/2012  Falls in the past year? Yes No No  Number falls in past yr: 1 - -  Injury with Fall? No - -  Follow up Falls evaluation completed - -   Depression Screen PHQ 2/9 Scores 10/14/2015 09/25/2014  11/05/2012 09/27/2011  PHQ - 2 Score 0 0 0 0     Cognitive Testing MMSE - Mini Mental State Exam 10/14/2015  Orientation to time 5  Orientation to Place 5  Registration 3  Attention/ Calculation 0  Recall 3  Language- name 2 objects 0  Language- repeat 1  Language- follow 3 step command 3  Language- read & follow direction 0  Write a sentence 0  Copy design 0  Total score 20   PLEASE NOTE: A Mini-Cog screen was completed. Maximum score is 20. A value of 0 denotes this part of Folstein MMSE was not completed.  Orientation to Time - Max 5 Orientation to Place - Max 5 Registration - Max 3 Recall - Max 3 Language Repeat - Max 1 Language Follow 3 Step Command - Max 3  Immunization History  Administered Date(s) Administered  . Influenza Split 03/14/2011, 04/01/2012  . Influenza Whole 02/24/2009, 02/24/2010  . Influenza, Seasonal, Injecte, Preservative Fre 04/17/2014  . Pneumococcal Conjugate-13 09/25/2014  . Pneumococcal Polysaccharide-23 10/24/2005, 10/14/2015  . Td 03/20/2006  . Zoster 09/09/2007   Screening Tests Health Maintenance  Topic Date Due  . COLONOSCOPY  10/13/2017 (Originally 06/27/2012)  . INFLUENZA VACCINE  01/25/2016  . TETANUS/TDAP  03/20/2016  . MAMMOGRAM  10/06/2016  . DEXA SCAN  Completed  . ZOSTAVAX  Completed  . Hepatitis C Screening  Completed  . PNA vac Low Risk Adult  Completed      Plan:     I have personally reviewed and addressed the Medicare Annual Wellness questionnaire and have noted the following in the patient's chart:  A. Medical and social history B. Use of alcohol, tobacco or illicit drugs  C. Current medications and supplements D. Functional ability and status E.  Nutritional status F.  Physical activity G. Advance directives H. List of other physicians I.  Hospitalizations, surgeries, and ER visits in previous 12 months J.  [place] to include hearing, vision, cognitive, depression L. Referrals and appointments -  none  In addition, I have reviewed and discussed with patient certain preventive protocols, quality metrics, and best practice recommendations. A written personalized care plan for preventive services as well as general preventive health recommendations were provided to patient.  See attached scanned questionnaire for additional information.   Signed,   Lindell Noe, MHA, BS, LPN Health Advisor [id number]

## 2015-10-15 LAB — HEPATITIS C ANTIBODY: HCV Ab: NEGATIVE

## 2015-10-18 ENCOUNTER — Other Ambulatory Visit: Payer: Self-pay | Admitting: Internal Medicine

## 2015-10-18 DIAGNOSIS — H2513 Age-related nuclear cataract, bilateral: Secondary | ICD-10-CM | POA: Diagnosis not present

## 2015-10-18 MED ORDER — LOSARTAN POTASSIUM 50 MG PO TABS: 50.0000 mg | ORAL_TABLET | Freq: Every day | ORAL | Status: DC

## 2015-10-18 MED ORDER — OMEPRAZOLE 20 MG PO CPDR: 20.0000 mg | DELAYED_RELEASE_CAPSULE | Freq: Every day | ORAL | Status: DC

## 2015-10-18 NOTE — Telephone Encounter (Signed)
Rx sent electronically.  

## 2015-10-18 NOTE — Addendum Note (Signed)
Addended by: Pilar Grammes on: 10/18/2015 12:23 PM   Modules accepted: Orders

## 2015-10-27 ENCOUNTER — Encounter: Payer: Self-pay | Admitting: Internal Medicine

## 2015-10-27 ENCOUNTER — Ambulatory Visit (INDEPENDENT_AMBULATORY_CARE_PROVIDER_SITE_OTHER): Payer: Medicare Other | Admitting: Internal Medicine

## 2015-10-27 VITALS — BP 124/80 | HR 70 | Temp 98.0°F | Wt 191.0 lb

## 2015-10-27 DIAGNOSIS — IMO0001 Reserved for inherently not codable concepts without codable children: Secondary | ICD-10-CM

## 2015-10-27 DIAGNOSIS — I872 Venous insufficiency (chronic) (peripheral): Secondary | ICD-10-CM | POA: Insufficient documentation

## 2015-10-27 DIAGNOSIS — I700 Atherosclerosis of aorta: Secondary | ICD-10-CM

## 2015-10-27 DIAGNOSIS — K219 Gastro-esophageal reflux disease without esophagitis: Secondary | ICD-10-CM

## 2015-10-27 DIAGNOSIS — J301 Allergic rhinitis due to pollen: Secondary | ICD-10-CM | POA: Diagnosis not present

## 2015-10-27 DIAGNOSIS — I10 Essential (primary) hypertension: Secondary | ICD-10-CM

## 2015-10-27 DIAGNOSIS — M8580 Other specified disorders of bone density and structure, unspecified site: Secondary | ICD-10-CM

## 2015-10-27 MED ORDER — OMEPRAZOLE 20 MG PO CPDR
20.0000 mg | DELAYED_RELEASE_CAPSULE | Freq: Every day | ORAL | Status: DC
Start: 2015-10-27 — End: 2017-01-05

## 2015-10-27 MED ORDER — LOSARTAN POTASSIUM 50 MG PO TABS: 50.0000 mg | ORAL_TABLET | Freq: Every day | ORAL | Status: DC

## 2015-10-27 NOTE — Progress Notes (Signed)
Subjective:    Patient ID: Denise Henson, female    DOB: 02/09/1946, 70 y.o.   MRN: [id number]  HPI Here with husband for follow up of chronic health issues  Reviewed her labs All fine except her glucose---but she was not fasting (in fact had a big lunch)  BP seems fine She does check regularly 120-130/70-80 No chest pain No SOB No dizziness or syncope Still with left foot swelling and ankle/calf rash (it tingles). Going to derm tomorrow Does wear support hose at times  Takes prilosec daily Controls heartburn unless she eats late No dysphagia  Having trouble with her pollen Worsened after recent red ant bites Uses OTC cetirizine  Current Outpatient Prescriptions on File Prior to Visit  Medication Sig Dispense Refill  . aspirin 81 MG EC tablet Take 81 mg by mouth daily.      . calcium gluconate 500 MG tablet Take 500 mg by mouth daily.      . Glucosamine 500 MG CAPS Take 500 mg by mouth 2 (two) times daily.      Marland Kitchen losartan (COZAAR) 50 MG tablet Take 1 tablet (50 mg total) by mouth daily. 90 tablet 0  . multivitamin-lutein (OCUVITE-LUTEIN) CAPS Take 1 capsule by mouth daily.    . Omega-3 Fatty Acids (FISH OIL) 1000 MG CAPS Take 1,000 mg by mouth 2 (two) times daily.      Marland Kitchen omeprazole (PRILOSEC) 20 MG capsule Take 1 capsule (20 mg total) by mouth daily. 90 capsule 0  . triamcinolone cream (KENALOG) 0.1 % Apply 1 application topically 2 (two) times daily as needed. 30 g 5   No current facility-administered medications on file prior to visit.    Allergies  Allergen Reactions  . Codeine     REACTION: unknown  . Lisinopril     REACTION: cough  . Morphine     REACTION: unspecified    Past Medical History  Diagnosis Date  . Coronary artery disease     One-vessel; s/p LIMA-LAD, [facility] 2007  . Hypertension   . Hyperlipidemia     Unable to tolerate simvastatin 40 due to myalgias  . Aortic insufficiency 10/08    Mild; echocardiogram showed EF 55-65% with mild  AI  . GERD (gastroesophageal reflux disease)   . Allergic rhinitis due to pollen     Past Surgical History  Procedure Laterality Date  . Coronary artery bypass graft  5/07    Readmitted for post op pleural effusions  . Tonsillectomy    . Cesarean section    . Stress imaging  6/07    Negative EF 67%  . Doppler echocardiography      NV LV EF 1+ AI/MR  . Cholecystectomy      Family History  Problem Relation Age of Onset  . Heart attack Mother 34  . Heart attack Maternal Grandmother   . Cancer Neg Hx     Breast or colon  . Diabetes Neg Hx   . Hypertension Neg Hx     Social History   Social History  . Marital Status: Married    Spouse Name: N/A  . Number of Children: 1  . Years of Education: N/A   Occupational History  . 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 Smoking status: Former Smoker    Quit date: 06/26/1994  . Smokeless tobacco: Never Used  . Alcohol Use: Yes  . Drug Use: No  . Sexual Activity:  No   Other Topics Concern  . Not on file   Social History Narrative   Has living will   Son [name], is health care POA   Would accept resuscitation attempts   No tube feeds if cognitively unaware   Review of Systems Tai chi/gardening and walking Sleeps okay most of the time Appetite is fine Has lost 3# since AMW---trying to be more careful Bowels are fine    Objective:   Physical Exam  Constitutional: She appears well-developed and well-nourished. No distress.  Neck: Normal range of motion. Neck supple. No thyromegaly present.  Cardiovascular: Normal rate, regular rhythm, normal heart sounds and intact distal pulses.  Exam reveals no gallop.   No murmur heard. Pulmonary/Chest: Effort normal and breath sounds normal. No respiratory distress. She has no wheezes. She has no rales.  Abdominal: Soft. There is no tenderness.  Musculoskeletal: She exhibits no edema or tenderness.  Lymphadenopathy:    She has no cervical  adenopathy.  Psychiatric: She has a normal mood and affect. Her behavior is normal.          Assessment & Plan:

## 2015-10-27 NOTE — Assessment & Plan Note (Signed)
BP Readings from Last 3 Encounters:  10/27/15 124/80  10/14/15 122/70  09/25/14 142/80   Good control No changes needed

## 2015-10-27 NOTE — Assessment & Plan Note (Signed)
Calcium and vitamin D Weight bearing exercise

## 2015-10-27 NOTE — Assessment & Plan Note (Signed)
Controlled with PPI

## 2015-10-27 NOTE — Assessment & Plan Note (Signed)
Some dilated veins in calves and slight swelling on right Hyperpigmented oval rash on medial lower left calf---probably stasis changes but going to derm Discussed support hose

## 2015-10-27 NOTE — Assessment & Plan Note (Signed)
Mild on echo 2011 without stenosis Murmur not audible today No action needed

## 2015-10-27 NOTE — Progress Notes (Signed)
Pre visit review using our clinic review tool, if applicable. No additional management support is needed unless otherwise documented below in the visit note. 

## 2015-10-27 NOTE — Assessment & Plan Note (Signed)
Okay with cetirizine

## 2015-10-28 DIAGNOSIS — I8310 Varicose veins of unspecified lower extremity with inflammation: Secondary | ICD-10-CM | POA: Diagnosis not present

## 2015-12-10 DIAGNOSIS — M79609 Pain in unspecified limb: Secondary | ICD-10-CM | POA: Diagnosis not present

## 2015-12-10 DIAGNOSIS — I83813 Varicose veins of bilateral lower extremities with pain: Secondary | ICD-10-CM | POA: Diagnosis not present

## 2015-12-10 DIAGNOSIS — M7989 Other specified soft tissue disorders: Secondary | ICD-10-CM | POA: Diagnosis not present

## 2016-02-08 DIAGNOSIS — M7989 Other specified soft tissue disorders: Secondary | ICD-10-CM | POA: Diagnosis not present

## 2016-02-08 DIAGNOSIS — I83813 Varicose veins of bilateral lower extremities with pain: Secondary | ICD-10-CM | POA: Diagnosis not present

## 2016-02-08 DIAGNOSIS — I872 Venous insufficiency (chronic) (peripheral): Secondary | ICD-10-CM | POA: Diagnosis not present

## 2016-02-08 DIAGNOSIS — M79609 Pain in unspecified limb: Secondary | ICD-10-CM | POA: Diagnosis not present

## 2016-02-09 ENCOUNTER — Other Ambulatory Visit: Payer: Medicare Other

## 2016-02-14 ENCOUNTER — Encounter: Payer: Medicare Other | Admitting: Internal Medicine

## 2016-03-03 ENCOUNTER — Other Ambulatory Visit: Payer: Self-pay | Admitting: Internal Medicine

## 2016-03-07 NOTE — Telephone Encounter (Signed)
Pt wants to know why refill was denied;advised should have available refills thru 10/2016. Pt will ck with [employer] and if needed will contact office.

## 2016-04-21 ENCOUNTER — Other Ambulatory Visit (INDEPENDENT_AMBULATORY_CARE_PROVIDER_SITE_OTHER): Payer: Self-pay | Admitting: Vascular Surgery

## 2016-04-21 ENCOUNTER — Encounter (INDEPENDENT_AMBULATORY_CARE_PROVIDER_SITE_OTHER): Payer: Self-pay | Admitting: Vascular Surgery

## 2016-04-25 ENCOUNTER — Encounter (INDEPENDENT_AMBULATORY_CARE_PROVIDER_SITE_OTHER): Payer: Self-pay

## 2016-04-28 ENCOUNTER — Other Ambulatory Visit (INDEPENDENT_AMBULATORY_CARE_PROVIDER_SITE_OTHER): Payer: Medicare Other | Admitting: Vascular Surgery

## 2016-05-02 ENCOUNTER — Encounter (INDEPENDENT_AMBULATORY_CARE_PROVIDER_SITE_OTHER): Payer: Self-pay

## 2016-05-02 DIAGNOSIS — Z23 Encounter for immunization: Secondary | ICD-10-CM | POA: Diagnosis not present

## 2016-06-01 DIAGNOSIS — I8310 Varicose veins of unspecified lower extremity with inflammation: Secondary | ICD-10-CM | POA: Diagnosis not present

## 2016-06-01 DIAGNOSIS — L820 Inflamed seborrheic keratosis: Secondary | ICD-10-CM | POA: Diagnosis not present

## 2016-11-24 ENCOUNTER — Encounter: Payer: Self-pay | Admitting: Family Medicine

## 2016-11-24 ENCOUNTER — Ambulatory Visit (INDEPENDENT_AMBULATORY_CARE_PROVIDER_SITE_OTHER): Payer: Medicare Other | Admitting: Family Medicine

## 2016-11-24 DIAGNOSIS — M159 Polyosteoarthritis, unspecified: Secondary | ICD-10-CM | POA: Insufficient documentation

## 2016-11-24 DIAGNOSIS — S161XXA Strain of muscle, fascia and tendon at neck level, initial encounter: Secondary | ICD-10-CM

## 2016-11-24 DIAGNOSIS — M47812 Spondylosis without myelopathy or radiculopathy, cervical region: Secondary | ICD-10-CM | POA: Diagnosis not present

## 2016-11-24 MED ORDER — MELOXICAM 7.5 MG PO TABS: 7.5000 mg | ORAL_TABLET | Freq: Every day | ORAL | 0 refills | Status: DC

## 2016-11-24 NOTE — Assessment & Plan Note (Signed)
Chronic .Marland Kitchen Not clearly related to current pain.

## 2016-11-24 NOTE — Progress Notes (Signed)
Subjective:    Patient ID: Denise Henson, female    DOB: Mar 02, 1946, 71 y.o.   MRN: [id number]  Neck Pain   This is a new problem. The current episode started 1 to 4 weeks ago (3 weeks). Episode frequency: Occurred after cruise and bus trips. The problem has been waxing and waning. The pain is associated with nothing (no known injury or fall.). The pain is present in the occipital region. The quality of the pain is described as aching. The pain is at a severity of 5/10 (occ wakes her up at night). The pain is moderate. The symptoms are aggravated by stress. The pain is same all the time. Stiffness is present: no neck stiffness. Associated symptoms include headaches. Pertinent negatives include no fever, numbness, pain with swallowing, paresis, photophobia, syncope, tingling, trouble swallowing, visual change or weakness. Associated symptoms comments: nausea. She has tried NSAIDs and heat ( aspirin) for the symptoms. The treatment provided no relief.   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  Review of Systems  Constitutional: Negative for fever.  HENT: Negative for trouble swallowing.   Eyes: Negative for photophobia.  Cardiovascular: Negative for syncope.  Musculoskeletal: Positive for neck pain.  Neurological: Positive for headaches. Negative for tingling, weakness and numbness.       Objective:   Physical Exam  Constitutional: She is oriented to person, place, and time. Vital signs are normal. She appears well-developed and well-nourished. She is cooperative.  Non-toxic appearance. She does not appear ill. No distress.  HENT:  Head: Normocephalic.  Right Ear: Hearing, tympanic membrane, external ear and ear canal normal. Tympanic membrane is not erythematous, not retracted and not bulging.  Left Ear: Hearing, tympanic membrane, external ear and ear canal normal. Tympanic membrane is not erythematous, not retracted and not bulging.  Nose: No mucosal edema or  rhinorrhea. Right sinus exhibits no maxillary sinus tenderness and no frontal sinus tenderness. Left sinus exhibits no maxillary sinus tenderness and no frontal sinus tenderness.  Mouth/Throat: Uvula is midline, oropharynx is clear and moist and mucous membranes are normal.  Eyes: Conjunctivae, EOM and lids are normal. Pupils are equal, round, and reactive to light. Lids are everted and swept, no foreign bodies found.  Neck: Trachea normal and normal range of motion. Neck supple. Carotid bruit is not present. No thyroid mass and no thyromegaly present.  Cardiovascular: Normal rate, regular rhythm, S1 normal, S2 normal, normal heart sounds, intact distal pulses and normal pulses.  Exam reveals no gallop and no friction rub.   No murmur heard. Pulmonary/Chest: Effort normal and breath sounds normal. No tachypnea. No respiratory distress. She has no decreased breath sounds. She has no wheezes. She has no rhonchi. She has no rales.  Abdominal: Soft. Normal appearance and bowel sounds are normal. There is no tenderness.  Musculoskeletal:       Cervical back: She exhibits decreased range of motion, tenderness and bony tenderness. She exhibits no swelling and no deformity.  ttp to palpation at insertion of trapezius muscle on right occiput, mild ttp at C2-3 vertebrae, decreased ROM of neck and crepitus.  Neurological: She is alert and oriented to person, place, and time. She has normal strength and normal reflexes. No cranial nerve deficit or sensory deficit. She exhibits normal muscle tone. She displays a negative Romberg sign. Coordination and gait normal. GCS eye subscore is 4. GCS verbal subscore is 5. GCS motor subscore is 6.  Nml cerebellar exam   No papilledema  Skin: Skin  is warm, dry and intact. No rash noted.  Psychiatric: She has a normal mood and affect. Her speech is normal and behavior is normal. Judgment and thought content normal. Her mood appears not anxious. Cognition and memory are  normal. Cognition and memory are not impaired. She does not exhibit a depressed mood. She exhibits normal recent memory and normal remote memory.          Assessment & Plan:

## 2016-11-24 NOTE — Assessment & Plan Note (Signed)
NSAID, heat and start home PT. Follow up if needed.  No red flags for intracranial issue or radiculopathy.

## 2016-11-24 NOTE — Patient Instructions (Signed)
Start meloxicam as needed for pain daily. Do not use with ibuprofen, aleve etc. Start home PT, heat and massage of neck.

## 2017-01-05 ENCOUNTER — Other Ambulatory Visit: Payer: Self-pay | Admitting: Internal Medicine

## 2017-03-01 ENCOUNTER — Other Ambulatory Visit: Payer: Self-pay | Admitting: Internal Medicine

## 2017-03-01 DIAGNOSIS — R7309 Other abnormal glucose: Secondary | ICD-10-CM

## 2017-03-01 DIAGNOSIS — I10 Essential (primary) hypertension: Secondary | ICD-10-CM

## 2017-03-06 ENCOUNTER — Other Ambulatory Visit (INDEPENDENT_AMBULATORY_CARE_PROVIDER_SITE_OTHER): Payer: Medicare Other

## 2017-03-06 DIAGNOSIS — R7309 Other abnormal glucose: Secondary | ICD-10-CM | POA: Diagnosis not present

## 2017-03-06 DIAGNOSIS — I10 Essential (primary) hypertension: Secondary | ICD-10-CM | POA: Diagnosis not present

## 2017-03-06 LAB — COMPREHENSIVE METABOLIC PANEL
ALT: 34 U/L (ref 0–35)
AST: 26 U/L (ref 0–37)
Albumin: 4.2 g/dL (ref 3.5–5.2)
Alkaline Phosphatase: 62 U/L (ref 39–117)
BUN: 20 mg/dL (ref 6–23)
CHLORIDE: 104 meq/L (ref 96–112)
CO2: 28 meq/L (ref 19–32)
CREATININE: 0.96 mg/dL (ref 0.40–1.20)
Calcium: 9.6 mg/dL (ref 8.4–10.5)
GFR: 60.73 mL/min (ref >60.00)
Glucose, Bld: 98 mg/dL (ref 70–99)
Potassium: 4.8 mEq/L (ref 3.5–5.1)
SODIUM: 139 meq/L (ref 135–145)
Total Bilirubin: 0.6 mg/dL (ref 0.2–1.2)
Total Protein: 6.7 g/dL (ref 6.0–8.3)

## 2017-03-06 LAB — CBC WITH DIFFERENTIAL/PLATELET
BASOS PCT: 1.1 % (ref 0.0–3.0)
Basophils Absolute: 0.1 10*3/uL (ref 0.0–0.1)
EOS ABS: 0.4 10*3/uL (ref 0.0–0.7)
EOS PCT: 6 % — AB (ref 0.0–5.0)
HEMATOCRIT: 39.1 % (ref 36.0–46.0)
HEMOGLOBIN: 12.9 g/dL (ref 12.0–15.0)
Lymphocytes Relative: 22.4 % (ref 12.0–46.0)
Lymphs Abs: 1.4 10*3/uL (ref 0.7–4.0)
MCHC: 33.1 g/dL (ref 30.0–36.0)
MCV: 94.4 fl (ref 78.0–100.0)
MONO ABS: 0.5 10*3/uL (ref 0.1–1.0)
Monocytes Relative: 7.6 % (ref 3.0–12.0)
Neutro Abs: 3.8 10*3/uL (ref 1.4–7.7)
Neutrophils Relative %: 62.9 % (ref 43.0–77.0)
PLATELETS: 242 10*3/uL (ref 150.0–400.0)
RBC: 4.14 Mil/uL (ref 3.87–5.11)
RDW: 14.5 % (ref 11.5–15.5)
WBC: 6.1 10*3/uL (ref 4.0–10.5)

## 2017-03-06 LAB — HEMOGLOBIN A1C: HEMOGLOBIN A1C: 5.7 % (ref 4.6–6.5)

## 2017-03-06 LAB — LIPID PANEL
CHOL/HDL RATIO: 4
CHOLESTEROL: 209 mg/dL — AB (ref 0–200)
HDL: 52.6 mg/dL (ref >39.00)
LDL CALC: 118 mg/dL — AB (ref 0–99)
NonHDL: 155.97
TRIGLYCERIDES: 188 mg/dL — AB (ref 0.0–149.0)
VLDL: 37.6 mg/dL (ref 0.0–40.0)

## 2017-03-08 ENCOUNTER — Encounter: Payer: Self-pay | Admitting: Internal Medicine

## 2017-03-08 ENCOUNTER — Ambulatory Visit (INDEPENDENT_AMBULATORY_CARE_PROVIDER_SITE_OTHER): Payer: Medicare Other | Admitting: Internal Medicine

## 2017-03-08 DIAGNOSIS — I872 Venous insufficiency (chronic) (peripheral): Secondary | ICD-10-CM

## 2017-03-08 DIAGNOSIS — I10 Essential (primary) hypertension: Secondary | ICD-10-CM

## 2017-03-08 DIAGNOSIS — Z7189 Other specified counseling: Secondary | ICD-10-CM | POA: Diagnosis not present

## 2017-03-08 DIAGNOSIS — K219 Gastro-esophageal reflux disease without esophagitis: Secondary | ICD-10-CM

## 2017-03-08 DIAGNOSIS — Z0000 Encounter for general adult medical examination without abnormal findings: Secondary | ICD-10-CM | POA: Diagnosis not present

## 2017-03-08 DIAGNOSIS — I2510 Atherosclerotic heart disease of native coronary artery without angina pectoris: Secondary | ICD-10-CM | POA: Diagnosis not present

## 2017-03-08 MED ORDER — TETANUS-DIPHTHERIA TOXOIDS TD 5-2 LFU IM INJ: 0.5000 mL | INJECTION | Freq: Once | INTRAMUSCULAR | 0 refills | Status: AC

## 2017-03-08 NOTE — Assessment & Plan Note (Signed)
BP Readings from Last 3 Encounters:  03/08/17 124/70  11/24/16 138/72  10/27/15 124/80   Good control Labs are fine

## 2017-03-08 NOTE — Assessment & Plan Note (Signed)
Fine on PPI

## 2017-03-08 NOTE — Assessment & Plan Note (Signed)
I have personally reviewed the Medicare Annual Wellness questionnaire and have noted 1. The patient's medical and social history 2. Their use of alcohol, tobacco or illicit drugs 3. Their current medications and supplements 4. The patient's functional ability including ADL's, fall risks, home safety risks and hearing or visual             impairment. 5. Diet and physical activities 6. Evidence for depression or mood disorders  The patients weight, height, BMI and visual acuity have been recorded in the chart I have made referrals, counseling and provided education to the patient based review of the above and I have provided the pt with a written personalized care plan for preventive services.  I have provided you with a copy of your personalized plan for preventive services. Please take the time to review along with your updated medication list.  She will set up mammogram Prefers no colon cancer screening--even after discussion Rx for Td Will get flu vaccines later at [facility]

## 2017-03-08 NOTE — Assessment & Plan Note (Signed)
See social history 

## 2017-03-08 NOTE — Patient Instructions (Addendum)
Please set up your screening mammogram. Please get your tetanus booster at the pharmacy.   DASH Eating Plan DASH stands for "Dietary Approaches to Stop Hypertension." The DASH eating plan is a healthy eating plan that has been shown to reduce high blood pressure (hypertension). It may also reduce your risk for type 2 diabetes, heart disease, and stroke. The DASH eating plan may also help with weight loss. What are tips for following this plan? General guidelines  Avoid eating more than 2,300 mg (milligrams) of salt (sodium) a day. If you have hypertension, you may need to reduce your sodium intake to 1,500 mg a day.  Limit alcohol intake to no more than 1 drink a day for nonpregnant women and 2 drinks a day for men. One drink equals 12 oz of beer, 5 oz of wine, or 1 oz of hard liquor.  Work with your health care provider to maintain a healthy body weight or to lose weight. Ask what an ideal weight is for you.  Get at least 30 minutes of exercise that causes your heart to beat faster (aerobic exercise) most days of the week. Activities may include walking, swimming, or biking.  Work with your health care provider or diet and nutrition specialist (dietitian) to adjust your eating plan to your individual calorie needs. Reading food labels  Check food labels for the amount of sodium per serving. Choose foods with less than 5 percent of the Daily Value of sodium. Generally, foods with less than 300 mg of sodium per serving fit into this eating plan.  To find whole grains, look for the word "whole" as the first word in the ingredient list. Shopping  Buy products labeled as "low-sodium" or "no salt added."  Buy fresh foods. Avoid canned foods and premade or frozen meals. Cooking  Avoid adding salt when cooking. Use salt-free seasonings or herbs instead of table salt or sea salt. Check with your health care provider or pharmacist before using salt substitutes.  Do not fry foods. Cook foods  using healthy methods such as baking, boiling, grilling, and broiling instead.  Cook with heart-healthy oils, such as olive, canola, soybean, or sunflower oil. Meal planning   Eat a balanced diet that includes: ? 5 or more servings of fruits and vegetables each day. At each meal, try to fill half of your plate with fruits and vegetables. ? Up to 6-8 servings of whole grains each day. ? Less than 6 oz of lean meat, poultry, or fish each day. A 3-oz serving of meat is about the same size as a deck of cards. One egg equals 1 oz. ? 2 servings of low-fat dairy each day. ? A serving of nuts, seeds, or beans 5 times each week. ? Heart-healthy fats. Healthy fats called Omega-3 fatty acids are found in foods such as flaxseeds and coldwater fish, like sardines, salmon, and mackerel.  Limit how much you eat of the following: ? Canned or prepackaged foods. ? Food that is high in trans fat, such as fried foods. ? Food that is high in saturated fat, such as fatty meat. ? Sweets, desserts, sugary drinks, and other foods with added sugar. ? Full-fat dairy products.  Do not salt foods before eating.  Try to eat at least 2 vegetarian meals each week.  Eat more home-cooked food and less restaurant, buffet, and fast food.  When eating at a restaurant, ask that your food be prepared with less salt or no salt, if possible. What foods are  recommended? The items listed may not be a complete list. Talk with your dietitian about what dietary choices are best for you. Grains Whole-grain or whole-wheat bread. Whole-grain or whole-wheat pasta. Brown rice. Modena Morrow. Bulgur. Whole-grain and low-sodium cereals. Pita bread. Low-fat, low-sodium crackers. Whole-wheat flour tortillas. Vegetables Fresh or frozen vegetables (raw, steamed, roasted, or grilled). Low-sodium or reduced-sodium tomato and vegetable juice. Low-sodium or reduced-sodium tomato sauce and tomato paste. Low-sodium or reduced-sodium canned  vegetables. Fruits All fresh, dried, or frozen fruit. Canned fruit in natural juice (without added sugar). Meat and other protein foods Skinless chicken or [place]. Ground chicken or [place]. Pork with fat trimmed off. Fish and seafood. Egg whites. Dried beans, peas, or lentils. Unsalted nuts, nut butters, and seeds. Unsalted canned beans. Lean cuts of beef with fat trimmed off. Low-sodium, lean deli meat. Dairy Low-fat (1%) or fat-free (skim) milk. Fat-free, low-fat, or reduced-fat cheeses. Nonfat, low-sodium ricotta or cottage cheese. Low-fat or nonfat yogurt. Low-fat, low-sodium cheese. Fats and oils Soft margarine without trans fats. Vegetable oil. Low-fat, reduced-fat, or light mayonnaise and salad dressings (reduced-sodium). Canola, safflower, olive, soybean, and sunflower oils. Avocado. Seasoning and other foods Herbs. Spices. Seasoning mixes without salt. Unsalted popcorn and pretzels. Fat-free sweets. What foods are not recommended? The items listed may not be a complete list. Talk with your dietitian about what dietary choices are best for you. Grains Baked goods made with fat, such as croissants, muffins, or some breads. Dry pasta or rice meal packs. Vegetables Creamed or fried vegetables. Vegetables in a cheese sauce. Regular canned vegetables (not low-sodium or reduced-sodium). Regular canned tomato sauce and paste (not low-sodium or reduced-sodium). Regular tomato and vegetable juice (not low-sodium or reduced-sodium). Angie Fava. Olives. Fruits Canned fruit in a light or heavy syrup. Fried fruit. Fruit in cream or butter sauce. Meat and other protein foods Fatty cuts of meat. Ribs. Fried meat. Berniece Salines. Sausage. Bologna and other processed lunch meats. Salami. Fatback. Hotdogs. Bratwurst. Salted nuts and seeds. Canned beans with added salt. Canned or smoked fish. Whole eggs or egg yolks. Chicken or [place] with skin. Dairy Whole or 2% milk, cream, and half-and-half. Whole or full-fat  cream cheese. Whole-fat or sweetened yogurt. Full-fat cheese. Nondairy creamers. Whipped toppings. Processed cheese and cheese spreads. Fats and oils Butter. Stick margarine. Lard. Shortening. Ghee. Bacon fat. Tropical oils, such as coconut, palm kernel, or palm oil. Seasoning and other foods Salted popcorn and pretzels. Onion salt, garlic salt, seasoned salt, table salt, and sea salt. Worcestershire sauce. Tartar sauce. Barbecue sauce. Teriyaki sauce. Soy sauce, including reduced-sodium. Steak sauce. Canned and packaged gravies. Fish sauce. Oyster sauce. Cocktail sauce. Horseradish that you find on the shelf. Ketchup. Mustard. Meat flavorings and tenderizers. Bouillon cubes. Hot sauce and Tabasco sauce. Premade or packaged marinades. Premade or packaged taco seasonings. Relishes. Regular salad dressings. Where to find more information:  National Heart, Lung, and [place]: https://wilson-eaton.com/  American Heart Association: www.heart.org Summary  The DASH eating plan is a healthy eating plan that has been shown to reduce high blood pressure (hypertension). It may also reduce your risk for type 2 diabetes, heart disease, and stroke.  With the DASH eating plan, you should limit salt (sodium) intake to 2,300 mg a day. If you have hypertension, you may need to reduce your sodium intake to 1,500 mg a day.  When on the DASH eating plan, aim to eat more fresh fruits and vegetables, whole grains, lean proteins, low-fat dairy, and heart-healthy fats.  Work with your health  care provider or diet and nutrition specialist (dietitian) to adjust your eating plan to your individual calorie needs. This information is not intended to replace advice given to you by your health care provider. Make sure you discuss any questions you have with your health care provider. Document Released: 06/01/2011 Document Revised: 06/05/2016 Document Reviewed: 06/05/2016 Elsevier Interactive Patient Education  2017 [employer].

## 2017-03-08 NOTE — Progress Notes (Signed)
Subjective:    Patient ID: Denise Henson, female    DOB: 02/11/1946, 71 y.o.   MRN: [id number]  HPI Here with husband for Medicare wellness and follow up of chronic health conditions Reviewed form and advanced directives Reviewed other doctors Enjoys 1-2 drinks daily---scotch No tobacco products Does exercise regularly No falls  Has some bad days with mood ---caring for husband. Nothing persistent. Not anhedonic Vision and hearing are fine Independent with instrumental ADLs No sig memory problems  No new concerns Typical aches and pain Uses fish oil and glucosamine for this Stays fairly fit  No chest pain Bypass due to congenital malformation  No palpitations No dizziness or syncope Maintains low cholesterol Still on aspirin  Takes prilosec regularly Avoids eating late at night No dysphagia  Current Outpatient Prescriptions on File Prior to Visit  Medication Sig Dispense Refill  . aspirin 81 MG EC tablet Take 81 mg by mouth daily.      . calcium gluconate 500 MG tablet Take 500 mg by mouth daily.      . Glucosamine 500 MG CAPS Take 500 mg by mouth 2 (two) times daily.      Marland Kitchen losartan (COZAAR) 50 MG tablet TAKE 1 TABLET (50 MG TOTAL) BY MOUTH DAILY. 90 tablet 0  . multivitamin-lutein (OCUVITE-LUTEIN) CAPS Take 1 capsule by mouth daily.    . Omega-3 Fatty Acids (FISH OIL) 1000 MG CAPS Take 1,000 mg by mouth 2 (two) times daily.      Marland Kitchen omeprazole (PRILOSEC) 20 MG capsule Take 1 capsule (20 mg total) by mouth daily. Please Schedule Medicare Wellness Exam 90 capsule 0   No current facility-administered medications on file prior to visit.     Allergies  Allergen Reactions  . Lisinopril     REACTION: cough  . Codeine Anxiety    jittery   . Morphine Anxiety    jittery    Past Medical History:  Diagnosis Date  . Allergic rhinitis due to pollen   . Aortic insufficiency 10/08   Mild; echocardiogram showed EF 55-65% with mild AI  . Coronary artery disease    One-vessel; s/p LIMA-LAD, [facility] 2007  . GERD (gastroesophageal reflux disease)   . Hyperlipidemia    Unable to tolerate simvastatin 40 due to myalgias  . Hypertension     Past Surgical History:  Procedure Laterality Date  . CESAREAN SECTION    . CHOLECYSTECTOMY    . CORONARY ARTERY BYPASS GRAFT  5/07   Readmitted for post op pleural effusions  . DOPPLER ECHOCARDIOGRAPHY     NV LV EF 1+ AI/MR  . Stress imaging  6/07   Negative EF 67%  . TONSILLECTOMY      Family History  Problem Relation Age of Onset  . Heart attack Mother 4  . Heart attack Maternal Grandmother   . Cancer Neg Hx        Breast or colon  . Diabetes Neg Hx   . Hypertension Neg Hx     Social History   Social History  . Marital status: Married    Spouse name: N/A  . Number of children: 1  . Years of education: N/A   Occupational History  . 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 Smoking status: Former Smoker    Quit date: 06/26/1994  . Smokeless tobacco: Never Used  . Alcohol use Yes  . Drug use: No  . Sexual activity: No  Other Topics Concern  . Not on file   Social History Narrative   Has living will   Son [name], is health care POA   Would accept resuscitation attempts   No tube feeds if cognitively unaware   Review of Systems Appetite is good Weight is stable Sleeps okay mostly--will have night awakening with her mind going Wears seat belt Teeth are fine. Keeps up with dentist Bowels are okay. No blood. Some eczema ---still sees Dr Marolyn Hammock for derm Ongoing chronic venous insufficiency---surgery for veins recommended but she is holding off. Does wear compression hose    Objective:   Physical Exam  Constitutional: She is oriented to person, place, and time. She appears well-nourished. No distress.  HENT:  Mouth/Throat: Oropharynx is clear and moist. No oropharyngeal exudate.  Neck: No thyromegaly present.  Cardiovascular: Normal  rate, regular rhythm, normal heart sounds and intact distal pulses.  Exam reveals no gallop.   No murmur heard. Pulmonary/Chest: Effort normal and breath sounds normal. No respiratory distress. She has no wheezes. She has no rales.  Abdominal: Soft. She exhibits no distension. There is no tenderness. There is no rebound and no guarding.  Musculoskeletal: She exhibits no edema or tenderness.  Lymphadenopathy:    She has no cervical adenopathy.  Neurological: She is alert and oriented to person, place, and time.  President--- "Daisy Floro, Barack Obama, Bush" [phone] [id number] Recall 3/3  Skin: No erythema.  Mild venous stasis changes  Psychiatric: She has a normal mood and affect. Her behavior is normal.          Assessment & Plan:

## 2017-03-08 NOTE — Assessment & Plan Note (Signed)
No symptoms ASA only Cholesterol low Congenitally abnormal LAD

## 2017-03-08 NOTE — Assessment & Plan Note (Signed)
Better now after regular use of support hose

## 2017-03-14 ENCOUNTER — Telehealth: Payer: Self-pay | Admitting: Internal Medicine

## 2017-03-14 DIAGNOSIS — S61211A Laceration without foreign body of left index finger without damage to nail, initial encounter: Secondary | ICD-10-CM | POA: Diagnosis not present

## 2017-03-14 DIAGNOSIS — Z23 Encounter for immunization: Secondary | ICD-10-CM | POA: Diagnosis not present

## 2017-03-14 NOTE — Telephone Encounter (Signed)
I called pt to verify she was going somewhere to be seen. Pt answered phone and said that she was at [facility] and could not talk;told pt was just making sure she was being taken care of. But after first few words. Pt disconnected call. FYI to Dr Silvio Pate.

## 2017-03-14 NOTE — Telephone Encounter (Signed)
Patient Name: Denise Henson  DOB: 10/19/1945    Initial Comment Caller states she just stabbed the joint of her finger with a screwdriver. She can see bone.    Nurse Assessment  Nurse: Leilani Merl, RN, Heather Date/Time (Eastern Time): 03/14/2017 9:10:22 AM  Confirm and document reason for call. If symptomatic, describe symptoms. ---Caller states she just stabbed the joint of her finger with a screwdriver. She can see bone.  Does the patient have any new or worsening symptoms? ---Yes  Will a triage be completed? ---Yes  Related visit to physician within the last 2 weeks? ---No  Does the PT have any chronic conditions? (i.e. diabetes, asthma, etc.) ---Yes  List chronic conditions. ---See MR  Is this a behavioral health or substance abuse call? ---No     Guidelines    Guideline Title Affirmed Question Affirmed Notes  Puncture Wound [1] Puncture wound of head, neck, chest, abdomen, or overlying a joint AND [2] could be deep    Final Disposition User   Go to ED Now Standifer, RN, Heather    Referrals  GO TO FACILITY UNDECIDED   Disagree/Comply: Comply

## 2017-03-14 NOTE — Telephone Encounter (Signed)
Please check on her tomorrow 

## 2017-03-15 NOTE — Telephone Encounter (Signed)
Spoke to pt. She said she went to the ER and they "glued her up". She is doing well.

## 2017-03-27 ENCOUNTER — Other Ambulatory Visit: Payer: Self-pay | Admitting: Internal Medicine

## 2017-04-04 DIAGNOSIS — Z1231 Encounter for screening mammogram for malignant neoplasm of breast: Secondary | ICD-10-CM | POA: Diagnosis not present

## 2017-04-10 ENCOUNTER — Telehealth: Payer: Self-pay

## 2017-04-10 DIAGNOSIS — H2513 Age-related nuclear cataract, bilateral: Secondary | ICD-10-CM | POA: Diagnosis not present

## 2017-04-10 NOTE — Telephone Encounter (Signed)
MyChart message sent to pt that mammogram was normal and to repeat in 2 years.

## 2017-04-11 ENCOUNTER — Other Ambulatory Visit: Payer: Self-pay | Admitting: Internal Medicine

## 2017-04-16 ENCOUNTER — Ambulatory Visit: Payer: Self-pay | Admitting: *Deleted

## 2017-04-16 ENCOUNTER — Ambulatory Visit (INDEPENDENT_AMBULATORY_CARE_PROVIDER_SITE_OTHER)
Admission: RE | Admit: 2017-04-16 | Discharge: 2017-04-16 | Disposition: A | Discharge status: Home / Self Care | Payer: Medicare Other | Source: Ambulatory Visit | Attending: Family Medicine | Admitting: Family Medicine

## 2017-04-16 ENCOUNTER — Ambulatory Visit (INDEPENDENT_AMBULATORY_CARE_PROVIDER_SITE_OTHER): Payer: Medicare Other | Admitting: Family Medicine

## 2017-04-16 ENCOUNTER — Encounter: Payer: Self-pay | Admitting: Family Medicine

## 2017-04-16 VITALS — BP 122/72 | HR 64 | Temp 98.6°F | Wt 198.5 lb

## 2017-04-16 DIAGNOSIS — M7989 Other specified soft tissue disorders: Secondary | ICD-10-CM

## 2017-04-16 DIAGNOSIS — M19042 Primary osteoarthritis, left hand: Secondary | ICD-10-CM | POA: Diagnosis not present

## 2017-04-16 DIAGNOSIS — I2510 Atherosclerotic heart disease of native coronary artery without angina pectoris: Secondary | ICD-10-CM

## 2017-04-16 LAB — CBC WITH DIFFERENTIAL/PLATELET
BASOS PCT: 0.6 % (ref 0.0–3.0)
Basophils Absolute: 0 10*3/uL (ref 0.0–0.1)
EOS ABS: 0.3 10*3/uL (ref 0.0–0.7)
EOS PCT: 4.8 % (ref 0.0–5.0)
HEMATOCRIT: 36.7 % (ref 36.0–46.0)
Hemoglobin: 12.2 g/dL (ref 12.0–15.0)
LYMPHS PCT: 19.7 % (ref 12.0–46.0)
Lymphs Abs: 1.3 10*3/uL (ref 0.7–4.0)
MCHC: 33.3 g/dL (ref 30.0–36.0)
MCV: 94.6 fl (ref 78.0–100.0)
MONO ABS: 0.5 10*3/uL (ref 0.1–1.0)
Monocytes Relative: 8.5 % (ref 3.0–12.0)
NEUTROS ABS: 4.2 10*3/uL (ref 1.4–7.7)
Neutrophils Relative %: 66.4 % (ref 43.0–77.0)
PLATELETS: 254 10*3/uL (ref 150.0–400.0)
RBC: 3.88 Mil/uL (ref 3.87–5.11)
RDW: 14 % (ref 11.5–15.5)
WBC: 6.4 10*3/uL (ref 4.0–10.5)

## 2017-04-16 LAB — SEDIMENTATION RATE: Sed Rate: 6 mm/hr (ref 0–30)

## 2017-04-16 LAB — BASIC METABOLIC PANEL
BUN: 19 mg/dL (ref 6–23)
CHLORIDE: 105 meq/L (ref 96–112)
CO2: 27 meq/L (ref 19–32)
CREATININE: 0.82 mg/dL (ref 0.40–1.20)
Calcium: 9.3 mg/dL (ref 8.4–10.5)
GFR: 72.82 mL/min (ref >60.00)
Glucose, Bld: 138 mg/dL — ABNORMAL HIGH (ref 70–99)
Potassium: 4.1 mEq/L (ref 3.5–5.1)
Sodium: 140 mEq/L (ref 135–145)

## 2017-04-16 MED ORDER — DOXYCYCLINE HYCLATE 100 MG PO TABS: 100.0000 mg | ORAL_TABLET | Freq: Two times a day (BID) | ORAL | 0 refills | Status: DC

## 2017-04-16 NOTE — Patient Instructions (Signed)
Denise Henson will call about your referral. Go to the lab on the way out.  We'll contact you with your lab and xray report. Start doxycycline in the meantime.  If more redness or pain or swelling then go to the ER.  Take care.  Glad to see you.

## 2017-04-16 NOTE — Telephone Encounter (Signed)
  Pt was referred to Dr. Damita Dunnings for follow up of finger injury at 2:15PM today.  Dr. Darl Householder MD Dr. Silvio Pate was booked for the day.   Reason for Disposition . [1] Finger wound AND [2] entire finger swollen  Answer Assessment - Initial Assessment Questions 1. MECHANISM: "How did the injury happen?"     Jabbed with a screwdriver 2. ONSET: "When did the injury happen?" (Minutes or hours ago)      Sept 19 3. LOCATION: "What part of the finger is injured?" "Is the nail damaged?"      Left index middle joint 4. APPEARANCE of the INJURY: "What does the injury look like?"      Swollen more.  Limited mobility 5. SEVERITY: "Can you use the hand normally?"  "Can you bend your fingers into a ball and then fully open them?"     No   Not able to move it as well now. 6. SIZE: For cuts, bruises, or swelling, ask: "How large is it?" (e.g., inches or centimeters;  entire finger)      Been glued closed but has become limited mobility and more swollen 7. PAIN: "Is there pain?" If so, ask: "How bad is the pain?"    (e.g., Scale 1-10; or mild, moderate, severe)     Not moving it. 8. TETANUS: For any breaks in the skin, ask: "When was the last tetanus booster?"     Had when injury glued together. 9. OTHER SYMPTOMS: "Do you have any other symptoms?"     *No Answer* 10. PREGNANCY: "Is there any chance you are pregnant?" "When was your last menstrual period?"       *No Answer*  Protocols used: WOUND INFECTION-A-AH, FINGER INJURY-A-AH

## 2017-04-16 NOTE — Progress Notes (Signed)
Accidentally injured L 2nd finger near PIP with a screwdriver.  This was about 1 month ago.  Injured area was glued shut and splinted at outside clinic.  Tetanus updated at that pint.  The glue fell off about 24 hours later.  She kept it bandaged.  She has local scar.  She was prev able to get full ROM but now with dec ROM and swelling and pain in the last ~10-14 days.  No FCNAVD.  No other hand complaints.    Meds, vitals, and allergies reviewed.   ROS: Per HPI unless specifically indicated in ROS section   L hand with diffuse 2nd finger swelling.  Dec ROM L 2nd PIP and DIP due to swelling and pain.  Tender near the PIP. No fluctuant mass. Distally neurovascularly intact. Normal range of motion in the hand otherwise. Normal radial pulse.

## 2017-04-17 ENCOUNTER — Encounter: Payer: Self-pay | Admitting: Family Medicine

## 2017-04-17 DIAGNOSIS — M7989 Other specified soft tissue disorders: Secondary | ICD-10-CM | POA: Insufficient documentation

## 2017-04-17 NOTE — Assessment & Plan Note (Signed)
She has decreased range of motion which is likely due to swelling. Unable to exclude tendon injury based on her exam. Check plain films today. Check routine labs. Start doxycycline given the potential for infection in the finger. No abscess palpated on exam. At this point still okay for outpatient follow-up but routine ER cautions given. Refer to orthopedics. Routed to PCP as FYI.

## 2017-04-19 DIAGNOSIS — Z23 Encounter for immunization: Secondary | ICD-10-CM | POA: Diagnosis not present

## 2017-08-07 ENCOUNTER — Ambulatory Visit (INDEPENDENT_AMBULATORY_CARE_PROVIDER_SITE_OTHER): Payer: Medicare Other | Admitting: Internal Medicine

## 2017-08-07 ENCOUNTER — Encounter: Payer: Self-pay | Admitting: Internal Medicine

## 2017-08-07 VITALS — BP 154/82 | HR 67 | Temp 98.0°F | Wt 194.8 lb

## 2017-08-07 DIAGNOSIS — M7989 Other specified soft tissue disorders: Secondary | ICD-10-CM

## 2017-08-07 DIAGNOSIS — M5432 Sciatica, left side: Secondary | ICD-10-CM | POA: Diagnosis not present

## 2017-08-07 MED ORDER — TIZANIDINE HCL 2 MG PO TABS: 2.0000 mg | ORAL_TABLET | Freq: Every evening | ORAL | 0 refills | Status: DC | PRN

## 2017-08-07 NOTE — Progress Notes (Signed)
Subjective:    Patient ID: Denise Henson, female    DOB: 13-Jul-1945, 72 y.o.   MRN: [id number]  HPI Here due to hip pain Wonders about arthritis or bursitis  Was awakening up to 10 times a night with "serious" pain in left hip Felt like it was inside (like by the ball of the joint) At times--seemed neurologic (down back of leg) Would adjust position and it would improve--but then recurred  Started around 6 weeks Was hoping it would improve Was in [place] for 3 weeks Didn't have any problems during the day unless she sat for a while (would get up and "hobble")  Doesn't remember any injury Tried asa, ibuprofen and tylenol--no help Still was intermittent  Finally slept okay last night  Current Outpatient Medications on File Prior to Visit  Medication Sig Dispense Refill  . aspirin 81 MG EC tablet Take 81 mg by mouth daily.      . calcium gluconate 500 MG tablet Take 500 mg by mouth daily.      . Glucosamine 500 MG CAPS Take 500 mg by mouth 2 (two) times daily.      Marland Kitchen losartan (COZAAR) 50 MG tablet TAKE 1 TABLET EVERY DAY 90 tablet 3  . multivitamin-lutein (OCUVITE-LUTEIN) CAPS Take 1 capsule by mouth daily.    . Omega-3 Fatty Acids (FISH OIL) 1000 MG CAPS Take 1,000 mg by mouth 2 (two) times daily.      Marland Kitchen omeprazole (PRILOSEC) 20 MG capsule TAKE 1 CAPSULE (20 MG TOTAL) BY MOUTH DAILY. 90 capsule 3   No current facility-administered medications on file prior to visit.     Allergies  Allergen Reactions  . Lisinopril     REACTION: cough  . Codeine Anxiety    jittery   . Morphine Anxiety    jittery    Past Medical History:  Diagnosis Date  . Allergic rhinitis due to pollen   . Aortic insufficiency 10/08   Mild; echocardiogram showed EF 55-65% with mild AI  . Coronary artery disease    One-vessel; s/p LIMA-LAD, [facility] 2007  . GERD (gastroesophageal reflux disease)   . Hyperlipidemia    Unable to tolerate simvastatin 40 due to myalgias  . Hypertension      Past Surgical History:  Procedure Laterality Date  . CESAREAN SECTION    . CHOLECYSTECTOMY    . CORONARY ARTERY BYPASS GRAFT  5/07   Readmitted for post op pleural effusions  . DOPPLER ECHOCARDIOGRAPHY     NV LV EF 1+ AI/MR  . Stress imaging  6/07   Negative EF 67%  . TONSILLECTOMY      Family History  Problem Relation Age of Onset  . Heart attack Mother 64  . Heart attack Maternal Grandmother   . Cancer Neg Hx        Breast or colon  . Diabetes Neg Hx   . Hypertension Neg Hx     Social History   Socioeconomic History  . Marital status: Married    Spouse name: Not on file  . Number of children: 1  . Years of education: Not on file  . Highest education level: Not on file  Social Needs  . Financial resource strain: Not on file  . Food insecurity - worry: Not on file  . Food insecurity - inability: Not on file  . Transportation needs - medical: Not on file  . Transportation needs - non-medical: Not on file  Occupational History  . 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 Smoking status: Former Smoker    Last attempt to quit: 06/26/1994    Years since quitting: 23.1  . Smokeless tobacco: Never Used  Substance and Sexual Activity  . Alcohol use: Yes  . Drug use: No  . Sexual activity: No  Other Topics Concern  . Not on file  Social History Narrative   Has living will   Son [name], is health care POA   Would accept resuscitation attempts   No tube feeds if cognitively unaware   Review of Systems No abnormal sensations in feet No loss of bowel or bladder function    Objective:   Physical Exam  Constitutional: No distress.  Musculoskeletal:  Still limited flexion in left 2nd index finger  No back/spine or hip tenderness Normal back flexion No bursa tenderness ROM normal in left hip  Neurological:  Normal gait Normal leg strength          Assessment & Plan:

## 2017-08-07 NOTE — Assessment & Plan Note (Signed)
No infection now Will have Dr Lorelei Pont check---?for possible injection

## 2017-08-07 NOTE — Assessment & Plan Note (Signed)
Better now Discussed usual self limited nature No worrisome features for HNP Will Rx tizanidine for night prn

## 2017-08-09 ENCOUNTER — Ambulatory Visit (INDEPENDENT_AMBULATORY_CARE_PROVIDER_SITE_OTHER): Payer: Medicare Other | Admitting: Family Medicine

## 2017-08-09 ENCOUNTER — Encounter: Payer: Self-pay | Admitting: Family Medicine

## 2017-08-09 ENCOUNTER — Other Ambulatory Visit: Payer: Self-pay

## 2017-08-09 VITALS — BP 142/70 | HR 66 | Temp 98.9°F | Ht 64.0 in | Wt 195.5 lb

## 2017-08-09 DIAGNOSIS — M659 Synovitis and tenosynovitis, unspecified: Secondary | ICD-10-CM

## 2017-08-09 DIAGNOSIS — M25642 Stiffness of left hand, not elsewhere classified: Secondary | ICD-10-CM

## 2017-08-09 MED ORDER — METHYLPREDNISOLONE ACETATE 40 MG/ML IJ SUSP
20.0000 mg | Freq: Once | INTRAMUSCULAR | Status: AC
  Administered 2017-08-09: 20 mg via INTRA_ARTICULAR

## 2017-08-09 NOTE — Patient Instructions (Signed)
REFERRALS TO SPECIALISTS, SPECIAL TESTS (MRI, CT, ULTRASOUNDS)  [facility] or [facility] will help you. ASK CHECK-IN FOR HELP.  Imaging / Special Testing referrals sometimes can be done same day if EMERGENCY, but others can take 2 or 3 days to get an appointment. Starting in 2015, many of the new Medicare plans and Obamacare plans take much longer.   Specialist appointment times vary a great deal, based on their schedule / openings. -- Some specialists have very long wait times. (Example. Dermatology. Multiple months  for non-cancer)   
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
## 2017-08-10 ENCOUNTER — Encounter: Payer: Self-pay | Admitting: Family Medicine

## 2017-08-21 DIAGNOSIS — M6281 Muscle weakness (generalized): Secondary | ICD-10-CM | POA: Diagnosis not present

## 2017-08-21 DIAGNOSIS — R278 Other lack of coordination: Secondary | ICD-10-CM | POA: Diagnosis not present

## 2017-08-23 DIAGNOSIS — R278 Other lack of coordination: Secondary | ICD-10-CM | POA: Diagnosis not present

## 2017-08-23 DIAGNOSIS — M6281 Muscle weakness (generalized): Secondary | ICD-10-CM | POA: Diagnosis not present

## 2017-08-28 DIAGNOSIS — M25642 Stiffness of left hand, not elsewhere classified: Secondary | ICD-10-CM | POA: Diagnosis not present

## 2017-08-30 ENCOUNTER — Encounter: Payer: Self-pay | Admitting: Family Medicine

## 2017-08-30 DIAGNOSIS — M25642 Stiffness of left hand, not elsewhere classified: Secondary | ICD-10-CM | POA: Diagnosis not present

## 2017-09-04 DIAGNOSIS — M25642 Stiffness of left hand, not elsewhere classified: Secondary | ICD-10-CM | POA: Diagnosis not present

## 2017-09-06 ENCOUNTER — Ambulatory Visit: Payer: Medicare Other | Admitting: Internal Medicine

## 2017-09-10 ENCOUNTER — Encounter: Payer: Self-pay | Admitting: Family Medicine

## 2017-09-10 ENCOUNTER — Ambulatory Visit (INDEPENDENT_AMBULATORY_CARE_PROVIDER_SITE_OTHER): Payer: Medicare Other | Admitting: Family Medicine

## 2017-09-10 ENCOUNTER — Other Ambulatory Visit: Payer: Self-pay

## 2017-09-10 VITALS — BP 160/70 | HR 75 | Temp 98.9°F | Ht 64.0 in | Wt 199.5 lb

## 2017-09-10 DIAGNOSIS — M25642 Stiffness of left hand, not elsewhere classified: Secondary | ICD-10-CM

## 2017-09-10 DIAGNOSIS — M659 Synovitis and tenosynovitis, unspecified: Secondary | ICD-10-CM

## 2017-09-10 DIAGNOSIS — M79645 Pain in left finger(s): Secondary | ICD-10-CM

## 2017-09-10 DIAGNOSIS — M7989 Other specified soft tissue disorders: Secondary | ICD-10-CM | POA: Diagnosis not present
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
## 2017-09-10 NOTE — Patient Instructions (Signed)
REFERRALS TO SPECIALISTS, SPECIAL TESTS (MRI, CT, ULTRASOUNDS)  [facility] or [facility] will help you. ASK CHECK-IN FOR HELP.  Imaging / Special Testing referrals sometimes can be done same day if EMERGENCY, but others can take 2 or 3 days to get an appointment. Starting in 2015, many of the new Medicare plans and Obamacare plans take much longer.   Specialist appointment times vary a great deal, based on their schedule / openings. -- Some specialists have very long wait times. (Example. Dermatology. Multiple months  for non-cancer)   

## 2017-09-12 ENCOUNTER — Other Ambulatory Visit: Payer: Self-pay | Admitting: Orthopedic Surgery

## 2017-09-12 ENCOUNTER — Encounter (HOSPITAL_BASED_OUTPATIENT_CLINIC_OR_DEPARTMENT_OTHER): Payer: Self-pay | Admitting: *Deleted

## 2017-09-12 ENCOUNTER — Encounter: Payer: Self-pay | Admitting: Family Medicine

## 2017-09-12 ENCOUNTER — Other Ambulatory Visit: Payer: Self-pay

## 2017-09-12 DIAGNOSIS — M7989 Other specified soft tissue disorders: Secondary | ICD-10-CM | POA: Diagnosis not present

## 2017-09-12 DIAGNOSIS — M65142 Other infective (teno)synovitis, left hand: Secondary | ICD-10-CM | POA: Diagnosis not present

## 2017-09-17 ENCOUNTER — Ambulatory Visit (HOSPITAL_BASED_OUTPATIENT_CLINIC_OR_DEPARTMENT_OTHER): Admission: RE | Admit: 2017-09-17 | Payer: Medicare Other | Source: Ambulatory Visit | Admitting: Orthopedic Surgery

## 2017-09-17 SURGERY — REPAIR, TENDON, FLEXOR
Anesthesia: Choice | Laterality: Left

## 2017-11-06 ENCOUNTER — Encounter: Payer: Self-pay | Admitting: Internal Medicine

## 2017-11-12 ENCOUNTER — Ambulatory Visit: Payer: Medicare Other | Admitting: Internal Medicine

## 2017-11-12 DIAGNOSIS — Z0289 Encounter for other administrative examinations: Secondary | ICD-10-CM

## 2017-11-16 DIAGNOSIS — L989 Disorder of the skin and subcutaneous tissue, unspecified: Secondary | ICD-10-CM | POA: Diagnosis not present

## 2017-11-17 IMAGING — DX DG FINGER INDEX 2+V*L*
3 series · 3 of 3 positions shown · non-contrast
Comparison: None.

CLINICAL DATA: Left index finger pain and swelling. No known
injury.

EXAM:
LEFT INDEX FINGER 2+V

[finger ap]
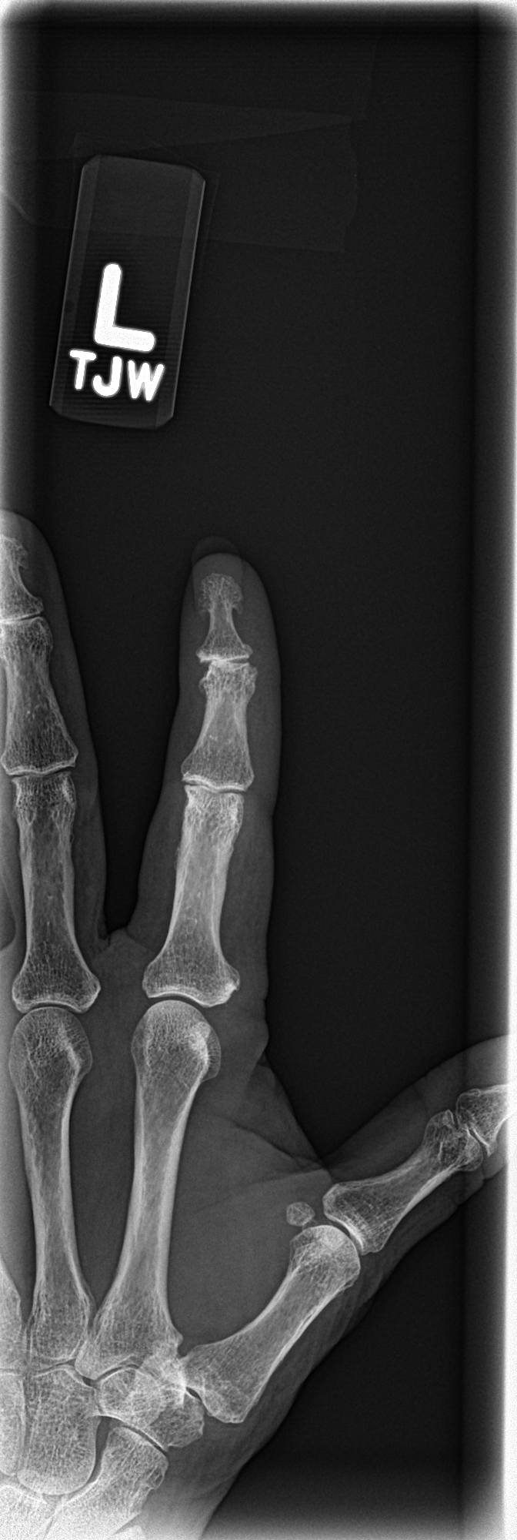

[finger obl]
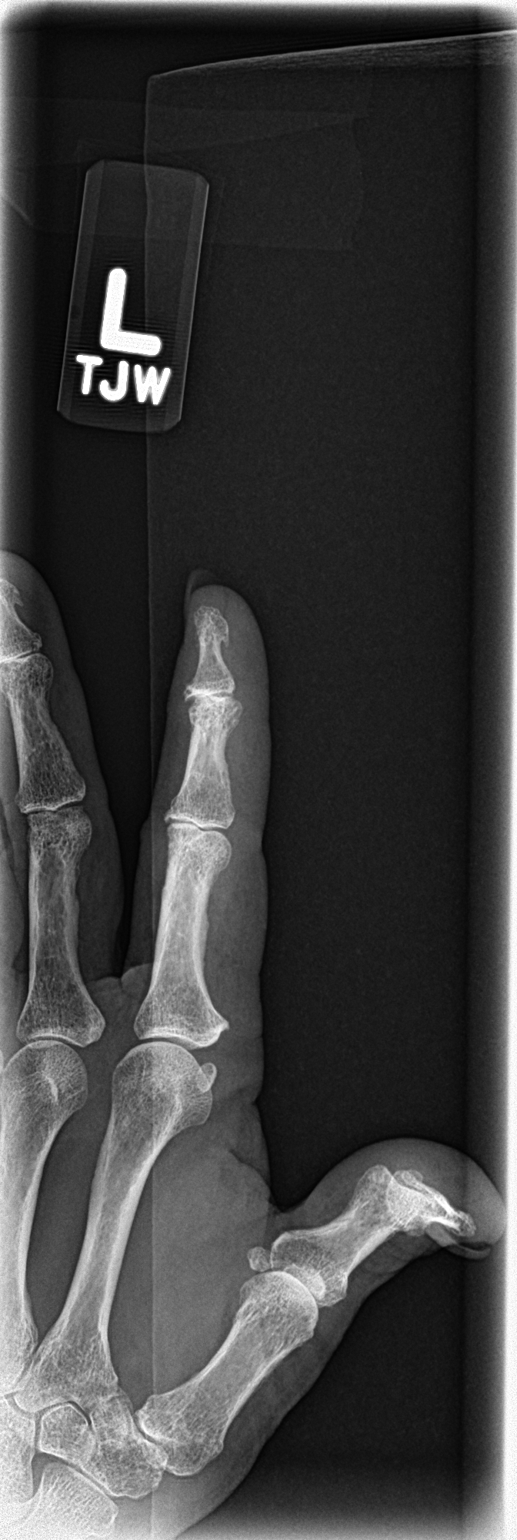

[finger lat]
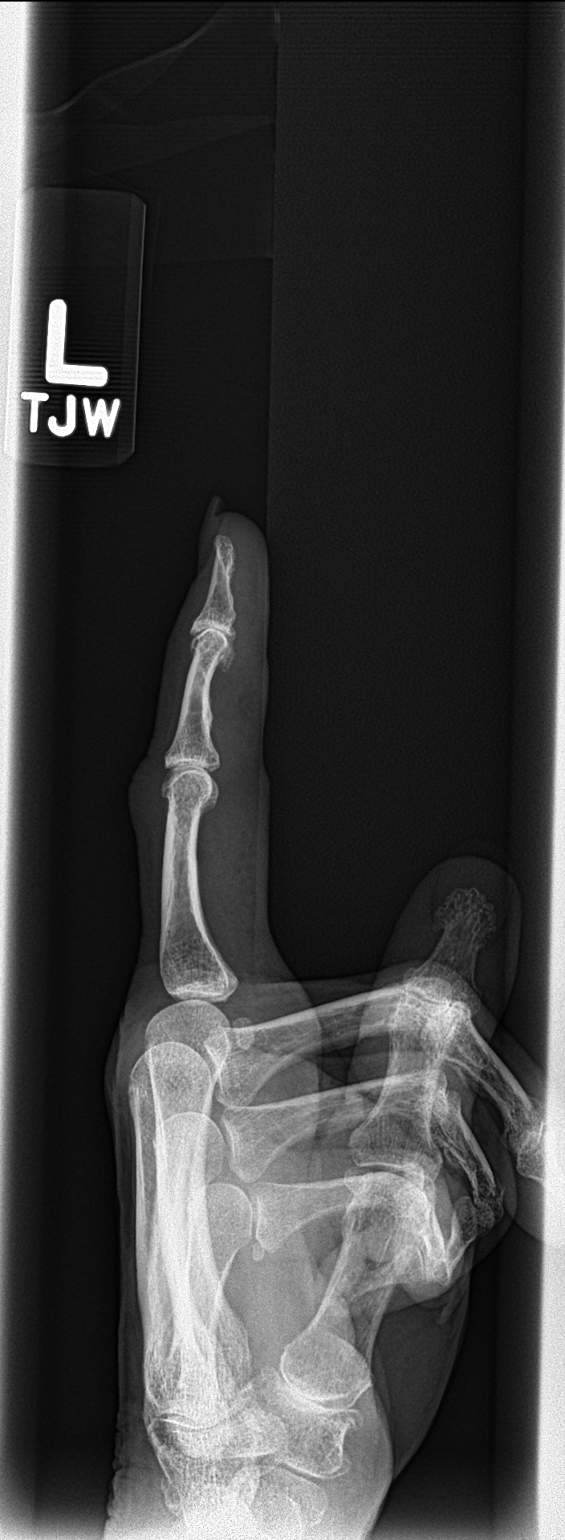

[3 of 3 positions shown; findings below may reference images not displayed]

FINDINGS: No acute bony or joint abnormality is identified. There is joint
space narrowing and osteophytosis about the PIP and DIP joints, much
worse at the DIP joint. Mild ulnar deviation of the DIP joint from
degenerative disease is noted. Mild to moderate first CMC
osteoarthritis is seen. No soft tissue gas or radiopaque foreign
body. Soft tissues of the index finger appear swollen.
IMPRESSION: Soft tissue swelling without underlying acute bony or joint
abnormality.

DIP worsened PIP joint osteoarthritis left index finger.

## 2017-12-05 DIAGNOSIS — M67442 Ganglion, left hand: Secondary | ICD-10-CM | POA: Diagnosis not present

## 2017-12-18 ENCOUNTER — Encounter
Admission: RE | Admit: 2017-12-18 | Discharge: 2017-12-18 | Disposition: A | Discharge status: Home / Self Care | Payer: Medicare Other | Source: Ambulatory Visit | Attending: Orthopedic Surgery | Admitting: Orthopedic Surgery

## 2017-12-18 ENCOUNTER — Other Ambulatory Visit: Payer: Self-pay

## 2017-12-18 DIAGNOSIS — Z01812 Encounter for preprocedural laboratory examination: Secondary | ICD-10-CM | POA: Insufficient documentation

## 2017-12-18 DIAGNOSIS — Z01810 Encounter for preprocedural cardiovascular examination: Secondary | ICD-10-CM | POA: Insufficient documentation

## 2017-12-18 DIAGNOSIS — I10 Essential (primary) hypertension: Secondary | ICD-10-CM | POA: Diagnosis not present

## 2017-12-18 DIAGNOSIS — I2510 Atherosclerotic heart disease of native coronary artery without angina pectoris: Secondary | ICD-10-CM | POA: Insufficient documentation

## 2017-12-18 DIAGNOSIS — R9431 Abnormal electrocardiogram [ECG] [EKG]: Secondary | ICD-10-CM | POA: Diagnosis not present

## 2017-12-18 HISTORY — DX: Personal history of other diseases of the respiratory system: Z87.09

## 2017-12-18 HISTORY — DX: Unspecified asthma, uncomplicated: J45.909

## 2017-12-18 LAB — BASIC METABOLIC PANEL
Anion gap: 8 (ref 5–15)
BUN: 26 mg/dL — AB (ref 8–23)
CALCIUM: 9.2 mg/dL (ref 8.9–10.3)
CO2: 24 mmol/L (ref 22–32)
Chloride: 105 mmol/L (ref 98–111)
Creatinine, Ser: 0.82 mg/dL (ref 0.44–1.00)
GFR calc Af Amer: >60 mL/min (ref >60)
GLUCOSE: 106 mg/dL — AB (ref 70–99)
Potassium: 4 mmol/L (ref 3.5–5.1)
Sodium: 137 mmol/L (ref 135–145)

## 2017-12-18 LAB — CBC
HCT: 36 % (ref 35.0–47.0)
Hemoglobin: 12 g/dL (ref 12.0–16.0)
MCH: 29.9 pg (ref 26.0–34.0)
MCHC: 33.2 g/dL (ref 32.0–36.0)
MCV: 89.9 fL (ref 80.0–100.0)
Platelets: 265 10*3/uL (ref 150–440)
RBC: 4 MIL/uL (ref 3.80–5.20)
RDW: 14.8 % — AB (ref 11.5–14.5)
WBC: 5.9 10*3/uL (ref 3.6–11.0)

## 2017-12-18 NOTE — Patient Instructions (Signed)
Your procedure is scheduled on: Tuesday, December 25, 2017 Report to Day Surgery on the 2nd floor of the [facility]. To find out your arrival time, please call [phone] between 1PM - 3PM on: Monday, December 24, 2017  REMEMBER: Instructions that are not followed completely may result in serious medical risk, up to and including death; or upon the discretion of your surgeon and anesthesiologist your surgery may need to be rescheduled.  Do not eat food after midnight the night before your procedure.  No gum chewing, lozengers or hard candies.  You may however, drink CLEAR liquids up to 2 hours before you are scheduled to arrive for your surgery. Do not drink anything within 2 hours of the start of your surgery.  Clear liquids include: - water  - apple juice without pulp - clear gatorade - black coffee or tea (Do NOT add anything to the coffee or tea) Do NOT drink anything that is not on this list.  No Alcohol for 24 hours before or after surgery.  No Smoking including e-cigarettes for 24 hours prior to surgery.  No chewable tobacco products for at least 6 hours prior to surgery.  No nicotine patches on the day of surgery.  On the morning of surgery brush your teeth with toothpaste and water, you may rinse your mouth with mouthwash if you wish. Do not swallow any toothpaste or mouthwash.  Notify your doctor if there is any change in your medical condition (cold, fever, infection).  Do not wear jewelry, make-up, hairpins, clips or nail polish.  Do not wear lotions, powders, or perfumes. You may wear deodorant.  Do not shave 48 hours prior to surgery.   Contacts and dentures may not be worn into surgery.  Do not bring valuables to the hospital, including drivers license, insurance or credit cards.  [facility] is not responsible for any belongings or valuables.   TAKE THESE MEDICATIONS THE MORNING OF SURGERY:  1.  Omeprazole (take one the night before surgery and one the morning  of surgery - helps to prevent nausea after surgery)  Use CHG Soap as directed on instruction sheet.  NOW!  Stop aspirin and Anti-inflammatories (NSAIDS) such as Advil, Aleve, Ibuprofen, Motrin, Naproxen, Naprosyn and Aspirin based products such as Excedrin, Goodys Powder, BC Powder. (May take Tylenol or Acetaminophen if needed.)  NOW!  Stop ANY OVER THE COUNTER supplements until after surgery. (GLUCOSAMINE, FISH OIL, TURMERIC, CALCIUM)  Wear comfortable clothing (specific to your surgery type) to the hospital.  Plan for stool softeners for home use.  If you are being discharged the day of surgery, you will not be allowed to drive home. You will need a responsible adult to drive you home and stay with you that night.   If you are taking public transportation, you will need to have a responsible adult with you. Please confirm with your physician that it is acceptable to use public transportation.   Please call [phone] if you have any questions about these instructions.

## 2017-12-19 NOTE — Pre-Procedure Instructions (Signed)
REQUEST FOR MEDICAL CLEARANCE/EKG CALLED AND FAXED TO DR Irving Copas, SPOKE WITH KAREN. ALSO FAXED FYI TO DR [facility]

## 2017-12-20 ENCOUNTER — Encounter: Payer: Self-pay | Admitting: Internal Medicine

## 2017-12-21 DIAGNOSIS — I351 Nonrheumatic aortic (valve) insufficiency: Secondary | ICD-10-CM | POA: Diagnosis not present

## 2017-12-21 DIAGNOSIS — E785 Hyperlipidemia, unspecified: Secondary | ICD-10-CM | POA: Diagnosis not present

## 2017-12-21 DIAGNOSIS — I25810 Atherosclerosis of coronary artery bypass graft(s) without angina pectoris: Secondary | ICD-10-CM | POA: Diagnosis not present

## 2017-12-21 DIAGNOSIS — R9431 Abnormal electrocardiogram [ECG] [EKG]: Secondary | ICD-10-CM | POA: Diagnosis not present

## 2017-12-21 DIAGNOSIS — I10 Essential (primary) hypertension: Secondary | ICD-10-CM | POA: Insufficient documentation

## 2017-12-21 DIAGNOSIS — I2510 Atherosclerotic heart disease of native coronary artery without angina pectoris: Secondary | ICD-10-CM | POA: Diagnosis not present

## 2017-12-21 DIAGNOSIS — I159 Secondary hypertension, unspecified: Secondary | ICD-10-CM | POA: Diagnosis not present

## 2017-12-21 DIAGNOSIS — Z01810 Encounter for preprocedural cardiovascular examination: Secondary | ICD-10-CM | POA: Diagnosis not present

## 2017-12-25 ENCOUNTER — Ambulatory Visit: Admission: RE | Admit: 2017-12-25 | Payer: Medicare Other | Source: Ambulatory Visit | Admitting: Orthopedic Surgery

## 2017-12-25 ENCOUNTER — Encounter: Admission: RE | Payer: Self-pay | Source: Ambulatory Visit

## 2017-12-25 SURGERY — EXCISION, GANGLION CYST, WRIST
Anesthesia: Choice | Laterality: Left

## 2018-01-02 ENCOUNTER — Encounter

## 2018-01-02 ENCOUNTER — Ambulatory Visit: Payer: Medicare Other | Admitting: Cardiovascular Disease

## 2018-01-09 DIAGNOSIS — I351 Nonrheumatic aortic (valve) insufficiency: Secondary | ICD-10-CM | POA: Diagnosis not present

## 2018-01-15 DIAGNOSIS — I2510 Atherosclerotic heart disease of native coronary artery without angina pectoris: Secondary | ICD-10-CM | POA: Diagnosis not present

## 2018-01-15 DIAGNOSIS — I159 Secondary hypertension, unspecified: Secondary | ICD-10-CM | POA: Diagnosis not present

## 2018-01-15 DIAGNOSIS — I351 Nonrheumatic aortic (valve) insufficiency: Secondary | ICD-10-CM | POA: Diagnosis not present

## 2018-01-15 DIAGNOSIS — E785 Hyperlipidemia, unspecified: Secondary | ICD-10-CM | POA: Diagnosis not present

## 2018-02-11 DIAGNOSIS — R4189 Other symptoms and signs involving cognitive functions and awareness: Secondary | ICD-10-CM | POA: Diagnosis not present

## 2018-02-11 DIAGNOSIS — R2681 Unsteadiness on feet: Secondary | ICD-10-CM | POA: Diagnosis not present

## 2018-02-11 DIAGNOSIS — Z741 Need for assistance with personal care: Secondary | ICD-10-CM | POA: Diagnosis not present

## 2018-02-13 ENCOUNTER — Other Ambulatory Visit: Payer: Self-pay

## 2018-02-13 ENCOUNTER — Encounter
Admission: RE | Admit: 2018-02-13 | Discharge: 2018-02-13 | Disposition: A | Discharge status: Home / Self Care | Payer: Medicare Other | Source: Ambulatory Visit | Attending: Orthopedic Surgery | Admitting: Orthopedic Surgery

## 2018-02-13 NOTE — Patient Instructions (Signed)
Your procedure is scheduled on: 02-21-18  Report to Same Day Surgery 2nd floor medical mall [facility] Entrance-take elevator on left to 2nd floor.  Check in with surgery information desk.) To find out your arrival time please call [phone] between 1PM - 3PM on 02-20-18  Remember: Instructions that are not followed completely may result in serious medical risk, up to and including death, or upon the discretion of your surgeon and anesthesiologist your surgery may need to be rescheduled.    _x___ 1. Do not eat food after midnight the night before your procedure. NO GUM OR CANDY AFTER MIDNIGHT.  You may drink clear liquids up to 2 hours before you are scheduled to arrive at the hospital for your procedure.  Do not drink clear liquids within 2 hours of your scheduled arrival to the hospital.  Clear liquids include  --Water or Apple juice without pulp  --Clear carbohydrate beverage such as ClearFast or Gatorade  --Black Coffee or Clear Tea (No milk, no creamers, do not add anything to the coffee or Tea   ____Ensure clear carbohydrate drink on the way to the hospital for bariatric patients  ____Ensure clear carbohydrate drink 3 hours before surgery for Dr Dwyane Luo patients if physician instructed.   No gum chewing or hard candies.     __x__ 2. No Alcohol for 24 hours before or after surgery.   __x__3. No Smoking or e-cigarettes for 24 prior to surgery.  Do not use any chewable tobacco products for at least 6 hour prior to surgery   ____  4. Bring all medications with you on the day of surgery if instructed.    __x__ 5. Notify your doctor if there is any change in your medical condition     (cold, fever, infections).    x___6. On the morning of surgery brush your teeth with toothpaste and water.  You may rinse your mouth with mouth wash if you wish.  Do not swallow any toothpaste or mouthwash.   Do not wear jewelry, make-up, hairpins, clips or nail polish.  Do not wear lotions,  powders, or perfumes. You may wear deodorant.  Do not shave 48 hours prior to surgery. Men may shave face and neck.  Do not bring valuables to the hospital.    [facility] is not responsible for any belongings or valuables.               Contacts, dentures or bridgework may not be worn into surgery.  Leave your suitcase in the car. After surgery it may be brought to your room.  For patients admitted to the hospital, discharge time is determined by your treatment team.  _  Patients discharged the day of surgery will not be allowed to drive home.  You will need someone to drive you home and stay with you the night of your procedure.    Please read over the following fact sheets that you were given:   [facility] Preparing for Surgery  _x___ TAKE THE FOLLOWING MEDICATION THE MORNING OF YOUR SURGERY WITH A SMALL SIP OF WATER. These include:  1. PRILOSEC  2. TAKE AN EXTRA PRILOSEC THE NIGHT BEFORE YOUR SURGERY  3.  4.  5.  6.  ____Fleets enema or Magnesium Citrate as directed.   _x___ Use CHG Soap or sage wipes as directed on instruction sheet   ____ Use inhalers on the day of surgery and bring to hospital day of surgery  ____ Stop Metformin and Janumet 2 days  prior to surgery.    ____ Take 1/2 of usual insulin dose the night before surgery and none on the morning surgery.   _x___ Follow recommendations from Cardiologist, Pulmonologist or PCP regarding stopping Aspirin, Coumadin, Plavix ,Eliquis, Effient, or Pradaxa, and Pletal-PT STATES SHE WAS INSTRUCTED TO STOP ASA 1 WEEK PRIOR TO SURGERY  X____Stop Anti-inflammatories such as Advil, Aleve, Ibuprofen, Motrin, Naproxen, Naprosyn, Goodies powders or aspirin products NOW-OK to take Tylenol   _x___ Stop supplements until after surgery-STOP [place]   ____ Bring C-Pap to the hospital.

## 2018-02-20 MED ORDER — CEFAZOLIN SODIUM-DEXTROSE 2-4 GM/100ML-% IV SOLN
2.0000 g | Freq: Once | INTRAVENOUS | Status: AC
  Administered 2018-02-21: 2 g via INTRAVENOUS

## 2018-02-20 NOTE — Pre-Procedure Instructions (Signed)
CARDIAC CLEARANCE ON CHART FROM DR PARACHOS-LOW RISK

## 2018-02-20 NOTE — Pre-Procedure Instructions (Signed)
NM myocardial perfusion SPECT multiple (stress and rest)01/09/2018 [place] Result Impression   1.Negative ETT 2.Normal left ventricular function 3.Normal wall motion 4.No evidence for scar or ischemia  Other Result Information  This result has an attachment that is not available.  Result Narrative  CARDIOLOGY DEPARTMENT [facility] [address] [place], [place] [phone]  Procedure: Exercise Myocardial Perfusion Imaging ONE day procedure  Indication: Coronary atherosclerosis of autologous vein bypass graft  without angina Plan: ECG stress test only  Hyperlipidemia, unspecified hyperlipidemia type Plan: ECG stress test only  Secondary hypertension Plan: ECG stress test only  Ordering Physician:   Dr. Isaias Cowman   Clinical History: 72 y.o. year old female Vitals: Height: 81 inWeight: 190 lb Cardiac risk factors include:  Hyperlipidemia, HTN, CABG and CAD    Procedure: The patient performed treadmill exercise using a Bruce protocol for 5:00  minutes. The exercise test was stopped due to fatigue.Blood pressure  response was hypertensive. The patient developed symptoms other than  fatigue during the procedure; specific symptoms included SOB AND SEVERE  FATIGUE  Rest HR: 62bpm Rest BP: 138/101mmHg Max HR: 141bpm Max BP: 210/156mmHg Mets: 7.00 % MAX HR: 95%  Stress Test Administered by: Oswald Hillock, CMA  ECG Interpretation: Rest HUO:HFGBMS sinus rhythm, none Stress XJD:BZMCEY sinus rhythm,  Recovery EMV:VKPQAE sinus rhythm ECG Interpretation:negative, no ECG changes.   Administrations This Visit  technetium Tc65m sestamibi (CARDIOLITE) injection 49.75 millicurie  Admin Date 30/10/1100 Action Given Dose 11.17 millicurie Route Intravenous Administered By Herbert Seta, CNMT     technetium Tc40m sestamibi (CARDIOLITE) injection 35.67  millicurie  Admin Date 01/41/0301 Action Given Dose 31.43 millicurie Route Intravenous Administered By Herbert Seta, CNMT       Gated post-stress perfusion imaging was performed 30 minutes after stress.  Rest images were performed 30 minutes after injection.  Gated LV Analysis:  TID:1.08  LVEF= 65%  FINDINGS: Regional wall motion:reveals normal myocardial thickening and wall  motion. The overall quality of the study is good. Artifacts noted: no Left ventricular cavity: normal.  Perfusion Analysis:SPECT images demonstrate homogeneous tracer  distribution throughout the myocardium.  Status Results Details   Encounter Summary

## 2018-02-20 NOTE — Pre-Procedure Instructions (Signed)
Echo complete7/17/2019 [place] Component Name Value Ref Range  LV Ejection Fraction (%) 55   Aortic Valve Stenosis Grade mild   Aortic Valve Regurgitation Grade mild   Aortic Valve Max Velocity (m/s) 2.2 m/sec  Aortic Valve Stenosis Mean Gradient (mmHg) 9.4 mmHg  Mitral Valve Stenosis Grade none   Mitral Valve Regurgitation Grade mild   Tricuspid Valve Regurgitation Grade mild   Tricuspid Valve Regurgitation Max Velocity (m/s) 2.4 m/sec  Right Ventricle Systolic Pressure (mmHg) 72.5 mmHg  LV End Diastolic Diameter (cm) 5.2 cm  LV End Systolic Diameter (cm) 3.7 cm  LV Septum Wall Thickness (cm) 1.2 cm  LV Posterior Wall Thickness (cm) 1.1 cm  Left Atrium Diameter (cm) 4.3 cm  Result Narrative   CARDIOLOGY DEPARTMENT Henson, Denise #: [id number] [address] Denise Henson, [place] [address] Date: 01/09/2018 10: 03 AM  Adult Female Age: 72 yrs ECHOCARDIOGRAM REPORTOutpatient  Denise Henson  STUDY:CHEST WALL TAPE:MD1: Henson, Denise Henson ECHO:YesDOPPLER:Yes FILE:BP: 158/90 mmHg  COLOR:Yes CONTRAST:No MACHINE:Philips  RV BIOPSY:No3D:NoSOUND QLTY:ModerateHeight: 64 in MEDIUM:NoneWeight: 190 lb  BSA: 1.9 m2 _________________________________________________________________________________________ HISTORY: DOE  REASON: Assess, LV function  INDICATION: Nonrheumatic aortic  valve insufficiency [I35.1 (ICD-10-CM)] _________________________________________________________________________________________ ECHOCARDIOGRAPHIC MEASUREMENTS 2D DIMENSIONS AORTAValues Normal Range MAIN PA ValuesNormal Range Annulus: 1.7 cm [2.1-2.5] PA Main: nm* [1.5-2.1] Aorta Sin: 3.0 cm [2.7-3.3]RIGHT VENTRICLE ST Junction: nm*[2.3-2.9] RV Base: nm* [<4.2] Asc.Aorta: nm*[2.3-3.1]RV Mid: nm* [<3.5] LEFT VENTRICLERV Length: 2.3 cm[<8.6] LVIDd: 5.2 cm [3.9-5.3]INFERIOR VENA CAVA LVIDs: 3.7 cmMax. IVC: nm* [<=2.1]  FS: 28.9 % [>25]Min. IVC: nm* SWT: 1.2 cm [0.5-0.9]------------------ PWT: 1.1 cm [0.5-0.9]nm* - not measured LEFT ATRIUM LA Diam: 4.3 cm [2.7-3.8] LA A4C Area: nm*[<20] LA Volume: nm*[22-52] _________________________________________________________________________________________ ECHOCARDIOGRAPHIC DESCRIPTIONS AORTIC ROOT  Size: Normal  Dissection: INDETERM FOR DISSECTION AORTIC VALVE  Leaflets: Tricuspid Morphology: MILDLY THICKENED  Mobility: Fully mobile LEFT VENTRICLE  Size: NormalAnterior: Normal Contraction: Normal Lateral: Normal  Closest EF: >55% (Estimated)Septal: Normal LV Masses: No Masses Apical: Normal LVH: MILD LVHInferior: Normal Posterior:  Normal  Dias.FxClass: N/A MITRAL VALVE  Leaflets: NormalMobility: Fully mobile  Morphology: Normal LEFT ATRIUM  Size: MILDLY ENLARGEDLA Masses: No masses IA Septum: Normal IAS MAIN PA  Size: Normal PULMONIC VALVE  Morphology: NormalMobility: Fully mobile RIGHT VENTRICLE RV Masses: No Masses Size: Normal Free Wall: Normal Contraction: Normal TRICUSPID VALVE  Leaflets: NormalMobility: Fully mobile  Morphology: Normal RIGHT ATRIUM  Size: NormalRA Other: None RA Mass: No masses PERICARDIUM Fluid: No effusion INFERIOR VENACAVA  Size: Normal Normal respiratory collapse _________________________________________________________________________________________  DOPPLER ECHO and OTHER SPECIAL PROCEDURES  Aortic: MILD ARMILD AS  215.5 cm/sec peak vel18.6 mmHg peak grad  9.4 mmHg mean grad  Mitral: MILD MRNo MS  2.3 cm^2 by DOPPLER  MV Inflow E Vel = 99.0 cm/sec MV Annulus E'Vel = 9.6 cm/sec  E/E'Ratio = 10.3 Tricuspid: MILD TRNo TS  235.6 cm/sec peak TR vel 27.2 mmHg peak RV pressure Pulmonary: No PRNo PS  104.5 cm/sec peak vel4.4 mmHg peak grad _________________________________________________________________________________________ INTERPRETATION NORMAL LEFT VENTRICULAR SYSTOLIC FUNCTION WITH MILD LVH NORMAL RIGHT VENTRICULAR SYSTOLIC  FUNCTION MILD VALVULAR REGURGITATION (See above) MILD VALVULAR STENOSIS (See above) AVA(VTI)=1.74cm^2 MILD MR, AR, TR MILD AS EF 55-60% _________________________________________________________________________________________ Electronically signed byMD Denise Henson on 01/09/2018 02: 49 PM  Performed By: Denise Henson, RDCS  Ordering Physician: Denise Henson _________________________________________________________________________________________  Other Result Information  Interface, Text Results In - 01/09/2018  2:49 PM EDT  CARDIOLOGY DEPARTMENT                 [facility]                                    [id number]           [place] #: [id number]           [place], [place], [place] [address]       Date: 01/09/2018 10: 03 AM                                                              Adult   Female   Age: 72 yrs           ECHOCARDIOGRAM REPORT                              Outpatient                                                              [facility]      STUDY:CHEST WALL               TAPE:                    MD1: Henson, Denise Henson       ECHO:Yes    DOPPLER:Yes       FILE:                    BP: 158/90 mmHg      COLOR:Yes   CONTRAST:No     MACHINE:Philips  RV BIOPSY:No          3D:No  SOUND QLTY:Moderate            Height: 64 in     MEDIUM:None                                              Weight: 190 lb                                                              BSA: 1.9 m2 _________________________________________________________________________________________               HISTORY: DOE                REASON: Assess, LV function  INDICATION: Nonrheumatic aortic valve insufficiency [I35.1 (ICD-10-CM)] _________________________________________________________________________________________ ECHOCARDIOGRAPHIC MEASUREMENTS 2D DIMENSIONS AORTA                   Values   Normal Range   MAIN PA         Values    Normal Range               Annulus: 1.7 cm       [2.1-2.5]         PA Main: nm*       [1.5-2.1]             Aorta Sin: 3.0 cm       [2.7-3.3]    RIGHT VENTRICLE           ST Junction: nm*          [2.3-2.9]         RV Base: nm*       [<4.2]             Asc.Aorta: nm*          [2.3-3.1]          RV Mid: nm*       [<3.5] LEFT VENTRICLE                                      RV Length: 2.3 cm    [<8.6]                 LVIDd: 5.2 cm       [3.9-5.3]    INFERIOR VENA CAVA                 LVIDs: 3.7 cm                        Max. IVC: nm*       [<=2.1]                    FS: 28.9 %       [>25]            Min. IVC: nm*                   SWT: 1.2 cm       [0.5-0.9]    ------------------                   PWT: 1.1 cm       [0.5-0.9]    nm* - not measured LEFT ATRIUM               LA Diam: 4.3 cm       [2.7-3.8]           LA A4C Area: nm*          [<20]             LA Volume: nm*          [22-52] _________________________________________________________________________________________ ECHOCARDIOGRAPHIC DESCRIPTIONS AORTIC ROOT                  Size: Normal            Dissection: INDETERM FOR DISSECTION AORTIC VALVE              Leaflets: Tricuspid                   Morphology:  MILDLY THICKENED              Mobility: Fully mobile LEFT VENTRICLE                  Size: Normal                        Anterior: Normal           Contraction: Normal                         Lateral: Normal            Closest EF: >55% (Estimated)                Septal: Normal             LV Masses: No Masses                       Apical: Normal                   LVH: MILD LVH                      Inferior: Normal                                                     Posterior: Normal          Dias.FxClass: N/A MITRAL VALVE              Leaflets: Normal                        Mobility: Fully mobile            Morphology: Normal LEFT ATRIUM                  Size:  MILDLY ENLARGED              LA Masses: No masses             IA Septum: Normal IAS MAIN PA                  Size: Normal PULMONIC VALVE            Morphology: Normal                        Mobility: Fully mobile RIGHT VENTRICLE             RV Masses: No Masses                         Size: Normal             Free Wall: Normal                     Contraction: Normal TRICUSPID VALVE              Leaflets: Normal                        Mobility: Fully mobile            Morphology: Normal RIGHT ATRIUM  Size: Normal                        RA Other: None               RA Mass: No masses PERICARDIUM                 Fluid: No effusion INFERIOR VENACAVA                  Size: Normal Normal respiratory collapse _________________________________________________________________________________________  DOPPLER ECHO and OTHER SPECIAL PROCEDURES                Aortic: MILD AR                    MILD AS                        215.5 cm/sec peak vel      18.6 mmHg peak grad                        9.4 mmHg mean grad                Mitral: MILD MR                    No MS                        2.3 cm^2 by DOPPLER                        MV Inflow E Vel = 99.0 cm/sec     MV Annulus E'Vel = 9.6 cm/sec                        E/E'Ratio = 10.3             Tricuspid: MILD TR                    No TS                        235.6 cm/sec peak TR vel   27.2 mmHg peak RV pressure             Pulmonary: No PR                      No PS                        104.5 cm/sec peak vel      4.4 mmHg peak grad _________________________________________________________________________________________ INTERPRETATION NORMAL LEFT VENTRICULAR SYSTOLIC FUNCTION   WITH MILD LVH NORMAL RIGHT VENTRICULAR SYSTOLIC FUNCTION MILD VALVULAR REGURGITATION (See above) MILD VALVULAR STENOSIS (See above) AVA(VTI)=1.74cm^2 MILD MR, AR, TR MILD AS EF  55-60% _________________________________________________________________________________________ Electronically signed by      MD Denise Henson on 01/09/2018 02: 49 PM          Performed By: Denise Henson, Tom Bean    Ordering Physician: Denise Henson _________________________________________________________________________________________  Status Results Details   Encounter Summary

## 2018-02-21 ENCOUNTER — Encounter: Payer: Self-pay | Admitting: *Deleted

## 2018-02-21 ENCOUNTER — Ambulatory Visit
Admission: RE | Admit: 2018-02-21 | Discharge: 2018-02-21 | Disposition: A | Discharge status: Home / Self Care | Payer: Medicare Other | Source: Ambulatory Visit | Attending: Orthopedic Surgery | Admitting: Orthopedic Surgery

## 2018-02-21 ENCOUNTER — Ambulatory Visit: Payer: Medicare Other | Admitting: Anesthesiology

## 2018-02-21 ENCOUNTER — Encounter
Admission: RE | Disposition: A | Discharge status: Home / Self Care | Payer: Self-pay | Source: Ambulatory Visit | Attending: Orthopedic Surgery

## 2018-02-21 DIAGNOSIS — L928 Other granulomatous disorders of the skin and subcutaneous tissue: Secondary | ICD-10-CM | POA: Insufficient documentation

## 2018-02-21 DIAGNOSIS — Z951 Presence of aortocoronary bypass graft: Secondary | ICD-10-CM | POA: Diagnosis not present

## 2018-02-21 DIAGNOSIS — E785 Hyperlipidemia, unspecified: Secondary | ICD-10-CM | POA: Diagnosis not present

## 2018-02-21 DIAGNOSIS — Z79899 Other long term (current) drug therapy: Secondary | ICD-10-CM | POA: Diagnosis not present

## 2018-02-21 DIAGNOSIS — M67442 Ganglion, left hand: Secondary | ICD-10-CM | POA: Diagnosis not present

## 2018-02-21 DIAGNOSIS — I10 Essential (primary) hypertension: Secondary | ICD-10-CM | POA: Diagnosis not present

## 2018-02-21 DIAGNOSIS — Z7982 Long term (current) use of aspirin: Secondary | ICD-10-CM | POA: Diagnosis not present

## 2018-02-21 DIAGNOSIS — Z87891 Personal history of nicotine dependence: Secondary | ICD-10-CM | POA: Insufficient documentation

## 2018-02-21 DIAGNOSIS — I2510 Atherosclerotic heart disease of native coronary artery without angina pectoris: Secondary | ICD-10-CM | POA: Insufficient documentation

## 2018-02-21 DIAGNOSIS — I739 Peripheral vascular disease, unspecified: Secondary | ICD-10-CM | POA: Diagnosis not present

## 2018-02-21 DIAGNOSIS — A318 Other mycobacterial infections: Secondary | ICD-10-CM | POA: Diagnosis not present

## 2018-02-21 DIAGNOSIS — J45909 Unspecified asthma, uncomplicated: Secondary | ICD-10-CM | POA: Diagnosis not present

## 2018-02-21 DIAGNOSIS — D1612 Benign neoplasm of short bones of left upper limb: Secondary | ICD-10-CM | POA: Diagnosis not present

## 2018-02-21 DIAGNOSIS — K219 Gastro-esophageal reflux disease without esophagitis: Secondary | ICD-10-CM | POA: Insufficient documentation

## 2018-02-21 DIAGNOSIS — A499 Bacterial infection, unspecified: Secondary | ICD-10-CM | POA: Diagnosis not present

## 2018-02-21 DIAGNOSIS — R2232 Localized swelling, mass and lump, left upper limb: Secondary | ICD-10-CM | POA: Diagnosis present

## 2018-02-21 HISTORY — PX: EXCISION METACARPAL MASS: SHX6372

## 2018-02-21 SURGERY — EXCISION METACARPAL MASS
Anesthesia: General | Laterality: Left

## 2018-02-21 MED ORDER — BUPIVACAINE HCL (PF) 0.5 % IJ SOLN
INTRAMUSCULAR | Status: DC | PRN
  Administered 2018-02-21: 10 mL

## 2018-02-21 MED ORDER — MIDAZOLAM HCL 2 MG/2ML IJ SOLN
INTRAMUSCULAR | Status: AC
  Filled 2018-02-21: qty 2

## 2018-02-21 MED ORDER — FENTANYL CITRATE (PF) 100 MCG/2ML IJ SOLN
INTRAMUSCULAR | Status: AC
  Filled 2018-02-21: qty 2

## 2018-02-21 MED ORDER — ACETAMINOPHEN 10 MG/ML IV SOLN
INTRAVENOUS | Status: AC
  Filled 2018-02-21: qty 100

## 2018-02-21 MED ORDER — FENTANYL CITRATE (PF) 100 MCG/2ML IJ SOLN: 25.0000 ug | INTRAMUSCULAR | Status: DC | PRN

## 2018-02-21 MED ORDER — ACETAMINOPHEN 10 MG/ML IV SOLN
INTRAVENOUS | Status: DC | PRN
  Administered 2018-02-21: 1000 mg via INTRAVENOUS

## 2018-02-21 MED ORDER — KETOROLAC TROMETHAMINE 30 MG/ML IJ SOLN
INTRAMUSCULAR | Status: DC | PRN
  Administered 2018-02-21: 30 mg via INTRAVENOUS

## 2018-02-21 MED ORDER — BUPIVACAINE HCL (PF) 0.5 % IJ SOLN
INTRAMUSCULAR | Status: AC
  Filled 2018-02-21: qty 30

## 2018-02-21 MED ORDER — LACTATED RINGERS IV SOLN: INTRAVENOUS | Status: DC

## 2018-02-21 MED ORDER — NEOMYCIN-POLYMYXIN B GU 40-200000 IR SOLN
Status: DC | PRN
  Administered 2018-02-21: 2 mL

## 2018-02-21 MED ORDER — NEOMYCIN-POLYMYXIN B GU 40-200000 IR SOLN
Status: AC
  Filled 2018-02-21: qty 1

## 2018-02-21 MED ORDER — LIDOCAINE HCL (CARDIAC) PF 100 MG/5ML IV SOSY
PREFILLED_SYRINGE | INTRAVENOUS | Status: DC | PRN
  Administered 2018-02-21: 30 mg via INTRAVENOUS

## 2018-02-21 MED ORDER — PROPOFOL 10 MG/ML IV BOLUS
INTRAVENOUS | Status: DC | PRN
  Administered 2018-02-21: 150 mg via INTRAVENOUS

## 2018-02-21 MED ORDER — CEFAZOLIN SODIUM-DEXTROSE 2-4 GM/100ML-% IV SOLN
INTRAVENOUS | Status: AC
  Filled 2018-02-21: qty 100

## 2018-02-21 MED ORDER — PROPOFOL 10 MG/ML IV BOLUS
INTRAVENOUS | Status: AC
  Filled 2018-02-21: qty 40

## 2018-02-21 MED ORDER — ONDANSETRON HCL 4 MG/2ML IJ SOLN
INTRAMUSCULAR | Status: DC | PRN
  Administered 2018-02-21: 4 mg via INTRAVENOUS

## 2018-02-21 SURGICAL SUPPLY — 24 items
BNDG CMPR 75X21 PLY HI ABS (MISCELLANEOUS) ×1
CANISTER SUCT 1200ML W/VALVE (MISCELLANEOUS) ×2 IMPLANT
CUFF TOURN 18 STER (MISCELLANEOUS) IMPLANT
ELECT REM PT RETURN 9FT ADLT (ELECTROSURGICAL) ×2
ELECTRODE REM PT RTRN 9FT ADLT (ELECTROSURGICAL) ×1 IMPLANT
GAUZE PETRO XEROFOAM 1X8 (MISCELLANEOUS) ×2 IMPLANT
GAUZE SPONGE 4X4 12PLY STRL (GAUZE/BANDAGES/DRESSINGS) ×2 IMPLANT
GAUZE STRETCH 2X75IN STRL (MISCELLANEOUS) ×2 IMPLANT
GLOVE SURG SYN 9.0  PF PI (GLOVE) ×1
GLOVE SURG SYN 9.0 PF PI (GLOVE) ×1 IMPLANT
GOWN SRG 2XL LVL 4 RGLN SLV (GOWNS) ×1 IMPLANT
GOWN STRL NON-REIN 2XL LVL4 (GOWNS) ×2
GOWN STRL REUS W/ TWL LRG LVL3 (GOWN DISPOSABLE) ×1 IMPLANT
GOWN STRL REUS W/TWL LRG LVL3 (GOWN DISPOSABLE) ×2
KIT TURNOVER KIT A (KITS) ×2 IMPLANT
NDL HYPO 25X1 1.5 SAFETY (NEEDLE) ×1 IMPLANT
NEEDLE HYPO 25X1 1.5 SAFETY (NEEDLE) ×2 IMPLANT
NS IRRIG 500ML POUR BTL (IV SOLUTION) ×2 IMPLANT
PACK EXTREMITY ARMC (MISCELLANEOUS) ×2 IMPLANT
SCALPEL PROTECTED #15 DISP (BLADE) ×4 IMPLANT
STOCKINETTE STRL 6IN 960660 (GAUZE/BANDAGES/DRESSINGS) ×2 IMPLANT
SUT ETHILON 4-0 (SUTURE) ×2
SUT ETHILON 4-0 FS2 18XMFL BLK (SUTURE) ×1
SUTURE ETHLN 4-0 FS2 18XMF BLK (SUTURE) ×1 IMPLANT

## 2018-02-21 NOTE — H&P (Signed)
Reviewed paper H+P, will be scanned into chart. No changes noted.  

## 2018-02-21 NOTE — Anesthesia Procedure Notes (Signed)
Procedure Name: LMA Insertion Date/Time: 02/21/2018 9:58 AM Performed by: Gentry Fitz, CRNA Pre-anesthesia Checklist: Patient identified Patient Re-evaluated:Patient Re-evaluated prior to induction Oxygen Delivery Method: Circle system utilized Preoxygenation: Pre-oxygenation with 100% oxygen Induction Type: IV induction Ventilation: Mask ventilation without difficulty LMA: LMA inserted LMA Size: 4.0 Tube secured with: Tape

## 2018-02-21 NOTE — Anesthesia Postprocedure Evaluation (Signed)
Anesthesia Post Note  Patient: Denise Henson  Procedure(s) Performed: EXCISION METACARPAL MASS (Left )  Patient location during evaluation: PACU Anesthesia Type: General Level of consciousness: awake and alert Pain management: pain level controlled Vital Signs Assessment: post-procedure vital signs reviewed and stable Respiratory status: spontaneous breathing, nonlabored ventilation, respiratory function stable and patient connected to nasal cannula oxygen Cardiovascular status: blood pressure returned to baseline and stable Postop Assessment: no apparent nausea or vomiting Anesthetic complications: no     Last Vitals:  Vitals:   02/21/18 1116 02/21/18 1134  BP: (!) 171/69 (!) 178/68  Pulse: 78 (!) 54  Resp: 16 16  Temp: 36.7 C   SpO2: 94% 97%    Last Pain:  Vitals:   02/21/18 1134  TempSrc:   PainSc: 0-No pain                 Precious Haws Antwoine Zorn

## 2018-02-21 NOTE — Anesthesia Preprocedure Evaluation (Signed)
Anesthesia Evaluation  Patient identified by MRN, date of birth, ID band Patient awake    Reviewed: Allergy & Precautions, H&P , NPO status , Patient's Chart, lab work & pertinent test results  History of Anesthesia Complications Negative for: history of anesthetic complications  Airway Mallampati: III  TM Distance: <3 FB Neck ROM: limited    Dental  (+) Chipped, Poor Dentition   Pulmonary neg shortness of breath, asthma , former smoker,           Cardiovascular Exercise Tolerance: Good hypertension, (-) angina+ CAD, + CABG and + Peripheral Vascular Disease  (-) Past MI and (-) DOE      Neuro/Psych  Neuromuscular disease negative psych ROS   GI/Hepatic Neg liver ROS, GERD  Medicated and Controlled,  Endo/Other  negative endocrine ROS  Renal/GU      Musculoskeletal  (+) Arthritis ,   Abdominal   Peds  Hematology negative hematology ROS (+)   Anesthesia Other Findings Past Medical History: No date: Allergic rhinitis due to pollen 10/08: Aortic insufficiency     Comment:  Mild; echocardiogram showed EF 55-65% with mild AI No date: Asthma     Comment:  as a child No date: Coronary artery disease     Comment:  One-vessel; s/p LIMA-LAD, [facility] 2007 No date: GERD (gastroesophageal reflux disease) 2007: Hx of pleural effusion     Comment:  after CABG No date: Hyperlipidemia     Comment:  Unable to tolerate simvastatin 40 due to myalgias No date: Hypertension  Past Surgical History: No date: CESAREAN SECTION No date: CHOLECYSTECTOMY 5/07: CORONARY ARTERY BYPASS GRAFT     Comment:  Readmitted for post op pleural effusions No date: DOPPLER ECHOCARDIOGRAPHY     Comment:  NV LV EF 1+ AI/MR 6/07: Stress imaging     Comment:  Negative EF 67% No date: TONSILLECTOMY  BMI    Body Mass Index:  31.93 kg/m      Reproductive/Obstetrics negative OB ROS                              Anesthesia Physical Anesthesia Plan  ASA: III  Anesthesia Plan: General LMA   Post-op Pain Management:    Induction: Intravenous  PONV Risk Score and Plan: Ondansetron, Dexamethasone, Midazolam and Treatment may vary due to age or medical condition  Airway Management Planned: LMA  Additional Equipment:   Intra-op Plan:   Post-operative Plan: Extubation in OR  Informed Consent: I have reviewed the patients History and Physical, chart, labs and discussed the procedure including the risks, benefits and alternatives for the proposed anesthesia with the patient or authorized representative who has indicated his/her understanding and acceptance.   Dental Advisory Given  Plan Discussed with: Anesthesiologist, CRNA and Surgeon  Anesthesia Plan Comments: (Patient consented for risks of anesthesia including but not limited to:  - adverse reactions to medications - damage to teeth, lips or other oral mucosa - sore throat or hoarseness - Damage to heart, brain, lungs or loss of life  Patient voiced understanding.)        Anesthesia Quick Evaluation

## 2018-02-21 NOTE — Discharge Instructions (Addendum)
AMBULATORY SURGERY  DISCHARGE INSTRUCTIONS   1) The drugs that you were given will stay in your system until tomorrow so for the next 24 hours you should not:  A) Drive an automobile B) Make any legal decisions C) Drink any alcoholic beverage   2) You may resume regular meals tomorrow.  Today it is better to start with liquids and gradually work up to solid foods.  You may eat anything you prefer, but it is better to start with liquids, then soup and crackers, and gradually work up to solid foods.   3) Please notify your doctor immediately if you have any unusual bleeding, trouble breathing, redness and pain at the surgery site, drainage, fever, or pain not relieved by medication. 4)   5) Your post-operative visit with Dr.                                     is: Date:                        Time:    Please call to schedule your post-operative visit.  6) Additional Instructions:     Keep dressing clean and dry.  Tylenol or Advil as needed for pain.  Call office if having severe pain tomorrow.

## 2018-02-21 NOTE — Op Note (Signed)
02/21/2018  10:35 AM  PATIENT:  Denise Henson  72 y.o. female  PRE-OPERATIVE DIAGNOSIS:  GANGLION  OF LEFT HAND  POST-OPERATIVE DIAGNOSIS: Mass OF LEFT HAND, index finger  PROCEDURE:  Procedure(s): EXCISION METACARPAL MASS (Left) excision mass from the base of the left index finger  SURGEON: Laurene Footman, MD  ASSISTANTS: None  ANESTHESIA:   general  EBL:  No intake/output data recorded.  BLOOD ADMINISTERED:none  DRAINS: none   LOCAL MEDICATIONS USED:  MARCAINE     SPECIMEN:  Source of Specimen:  Volar mass left index finger proximal phalanx  DISPOSITION OF SPECIMEN:  PATHOLOGY  COUNTS:  YES  TOURNIQUET:   Total Tourniquet Time Documented: Upper Arm (Left) - 16 minutes Total: Upper Arm (Left) - 16 minutes   IMPLANTS: None  DICTATION: .Dragon Dictation patient was brought to the operating room and after adequate anesthesia was obtained the left arm was prepped and draped you sterile fashion.  After patient identification and timeout procedures were completed, tourniquet was raised.  Incision was made over the proximal phalanx a Brunner's type incision with the flap based on the ulnar side of the proximal phalanx.  The skin flap was elevated care being taken to not to damage the neurovascular bundles on either side of the volar finger.  The mass was visible just under the skin and was well delineated volarly but was somewhat adherent to the underlying pulley.  This was excised with scalpel and rondure.  The mass was approximately 1.5 cm in diameter and tracked somewhat to the ulnar side to the midline of the finger.  After excision of the mass and multiple pieces which was then sent as specimen the wound was thoroughly irrigated.  The tendon was intact.  The mass was somewhat granular tissue light-colored and after excision there was full range of motion of the finger.  The skin was closed with simple interrupted 4-0 nylon skin suture.  A digital block was placed with 10 cc  of half percent Sensorcaine plain to aid in postop analgesia.  The wound was covered with Xeroform 4 x 4's and a finger roll.  Tourniquet was let down at the close of the case.  PLAN OF CARE: Discharge to home after PACU  PATIENT DISPOSITION:  PACU - hemodynamically stable.

## 2018-02-21 NOTE — Anesthesia Post-op Follow-up Note (Signed)
Anesthesia QCDR form completed.        

## 2018-02-21 NOTE — Transfer of Care (Signed)
Immediate Anesthesia Transfer of Care Note  Patient: Denise Henson  Procedure(s) Performed: EXCISION METACARPAL MASS (Left )  Patient Location: PACU  Anesthesia Type:General  Level of Consciousness: awake, alert  and oriented  Airway & Oxygen Therapy: Patient Spontanous Breathing and Patient connected to face mask oxygen  Post-op Assessment: Report given to RN and Post -op Vital signs reviewed and stable  Post vital signs: Reviewed and stable  Last Vitals:  Vitals Value Taken Time  BP 145/79 02/21/2018 10:34 AM  Temp 37 C 02/21/2018 10:34 AM  Pulse 59 02/21/2018 10:41 AM  Resp 22 02/21/2018 10:41 AM  SpO2 100 % 02/21/2018 10:41 AM  Vitals shown include unvalidated device data.  Last Pain:  Vitals:   02/21/18 0821  TempSrc: Oral         Complications: No apparent anesthesia complications

## 2018-02-26 LAB — SURGICAL PATHOLOGY

## 2018-03-03 ENCOUNTER — Other Ambulatory Visit: Payer: Self-pay | Admitting: Internal Medicine

## 2018-03-13 ENCOUNTER — Ambulatory Visit: Payer: Medicare Other | Admitting: Internal Medicine

## 2018-03-14 ENCOUNTER — Ambulatory Visit (INDEPENDENT_AMBULATORY_CARE_PROVIDER_SITE_OTHER): Payer: Medicare Other | Admitting: Internal Medicine

## 2018-03-14 ENCOUNTER — Encounter: Payer: Self-pay | Admitting: Internal Medicine

## 2018-03-14 VITALS — BP 126/84 | HR 60 | Temp 98.6°F | Ht 63.75 in | Wt 183.0 lb

## 2018-03-14 DIAGNOSIS — K219 Gastro-esophageal reflux disease without esophagitis: Secondary | ICD-10-CM

## 2018-03-14 DIAGNOSIS — Z7189 Other specified counseling: Secondary | ICD-10-CM

## 2018-03-14 DIAGNOSIS — I10 Essential (primary) hypertension: Secondary | ICD-10-CM | POA: Diagnosis not present

## 2018-03-14 DIAGNOSIS — M8580 Other specified disorders of bone density and structure, unspecified site: Secondary | ICD-10-CM | POA: Diagnosis not present

## 2018-03-14 DIAGNOSIS — Z0000 Encounter for general adult medical examination without abnormal findings: Secondary | ICD-10-CM | POA: Diagnosis not present

## 2018-03-14 NOTE — Progress Notes (Signed)
Hearing Screening   Method: Audiometry   125Hz  250Hz  500Hz  1000Hz  2000Hz  3000Hz  4000Hz  6000Hz  8000Hz   Right ear:   20 20 20  20     Left ear:   20 20 20  20     Vision Screening Comments: February 2019

## 2018-03-14 NOTE — Assessment & Plan Note (Signed)
BP Readings from Last 3 Encounters:  03/14/18 126/84  02/21/18 (!) 178/68  12/18/17 (!) 151/65   Fine on the losartan

## 2018-03-14 NOTE — Assessment & Plan Note (Signed)
Controlled with PPI Discussed trying to wean

## 2018-03-14 NOTE — Assessment & Plan Note (Signed)
Mild  No need to recheck DEXA for now

## 2018-03-14 NOTE — Assessment & Plan Note (Signed)
See social history 

## 2018-03-14 NOTE — Assessment & Plan Note (Signed)
I have personally reviewed the Medicare Annual Wellness questionnaire and have noted 1. The patient's medical and social history 2. Their use of alcohol, tobacco or illicit drugs 3. Their current medications and supplements 4. The patient's functional ability including ADL's, fall risks, home safety risks and hearing or visual             impairment. 5. Diet and physical activities 6. Evidence for depression or mood disorders  The patients weight, height, BMI and visual acuity have been recorded in the chart I have made referrals, counseling and provided education to the patient based review of the above and I have provided the pt with a written personalized care plan for preventive services.  I have provided you with a copy of your personalized plan for preventive services. Please take the time to review along with your updated medication list.  She is not willing to do the fecal tests----when things settle down, she will decide about a colonoscopy Flu vaccine at [facility] She will check into the shingrix at pharmacy mammo due next year Exercises regularly

## 2018-03-14 NOTE — Progress Notes (Signed)
Subjective:    Patient ID: Denise Henson, female    DOB: 26-Nov-1945, 72 y.o.   MRN: [id number]  HPI Here for Medicare wellness visit and follow up of chronic health conditions Reviewed form and advanced directives Reviewed other doctors. Enjoys alcohol 1-2 per day No tobacco Exercising regularly No falls No depression or anhedonia No vision or hearing problems Independent with instrumental ADLs No memory problems  Adjusting to having husband with dementia finally go in for long term care She is over most of the guilt--realizes it is progression of the disease Still spending about 5 hours per day with him though She is going to downsize to 1 bedroom apartment  Did have cardiac work up recently before the finger surgery Echo looked fine and stress test was normal Had mycobacterial infection in that finger---will be going to ID for decision about what needs to be done No chest pain No SOB No issues with exercise tolerance  BP has been okay No problems with losartan Usually 120's/70's No headaches or dizziness  Takes omeprazole regularly No heartburn or dysphagia on this  Current Outpatient Medications on File Prior to Visit  Medication Sig Dispense Refill  . aspirin EC 325 MG tablet Take 650 mg by mouth daily as needed for moderate pain.    . Calcium Carb-Cholecalciferol (CALCIUM 500 +D) 500-400 MG-UNIT TABS Take 1 tablet by mouth daily.    . Glucosamine 500 MG CAPS Take 500 mg by mouth 2 (two) times daily.    Marland Kitchen losartan (COZAAR) 50 MG tablet TAKE 1 TABLET EVERY DAY 90 tablet 3  . Omega-3 Fatty Acids (FISH OIL) 1000 MG CAPS Take 1,000 mg by mouth 2 (two) times daily.    Marland Kitchen omeprazole (PRILOSEC) 20 MG capsule TAKE 1 CAPSULE BY MOUTH DAILY. 90 capsule 3   No current facility-administered medications on file prior to visit.     Allergies  Allergen Reactions  . Lisinopril     cough  . Codeine Anxiety    jittery   . Morphine Anxiety    jittery    Past Medical  History:  Diagnosis Date  . Allergic rhinitis due to pollen   . Aortic insufficiency 10/08   Mild; echocardiogram showed EF 55-65% with mild AI  . Asthma    as a child  . Coronary artery disease    One-vessel; s/p LIMA-LAD, [facility] 2007  . GERD (gastroesophageal reflux disease)   . Hx of pleural effusion 2007   after CABG  . Hyperlipidemia    Unable to tolerate simvastatin 40 due to myalgias  . Hypertension     Past Surgical History:  Procedure Laterality Date  . CESAREAN SECTION    . CHOLECYSTECTOMY    . CORONARY ARTERY BYPASS GRAFT  5/07   Readmitted for post op pleural effusions  . DOPPLER ECHOCARDIOGRAPHY     NV LV EF 1+ AI/MR  . EXCISION METACARPAL MASS Left 02/21/2018   Procedure: EXCISION METACARPAL MASS;  Surgeon: Hessie Knows, MD;  Location: [facility] ORS;  Service: Orthopedics;  Laterality: Left;  . Stress imaging  6/07   Negative EF 67%  . TONSILLECTOMY      Family History  Problem Relation Age of Onset  . Heart attack Mother 64  . Heart attack Maternal Grandmother   . Cancer Neg Hx        Breast or colon  . Diabetes Neg Hx   . Hypertension Neg Hx     Social History   Socioeconomic History  .  Marital status: Married    Spouse name: Not on file  . Number of children: 1  . Years of education: Not on file  . Highest education level: Not on file  Occupational History  . 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 Financial resource strain: Not on file  . Food insecurity:    Worry: Not on file    Inability: Not on file  . Transportation needs:    Medical: Not on file    Non-medical: Not on file  Tobacco Use  . Smoking status: Former Smoker    Types: Cigarettes    Last attempt to quit: 06/26/1994    Years since quitting: 23.7  . Smokeless tobacco: Never Used  Substance and Sexual Activity  . Alcohol use: Yes    Comment: one daily  . Drug use: No  . Sexual activity: Never  Lifestyle  .  Physical activity:    Days per week: Not on file    Minutes per session: Not on file  . Stress: Not on file  Relationships  . Social connections:    Talks on phone: Not on file    Gets together: Not on file    Attends religious service: Not on file    Active member of club or organization: Not on file    Attends meetings of clubs or organizations: Not on file    Relationship status: Not on file  . Intimate partner violence:    Fear of current or ex partner: Not on file    Emotionally abused: Not on file    Physically abused: Not on file    Forced sexual activity: Not on file  Other Topics Concern  . Not on file  Social History Narrative   Has living will   Son [name], is health care POA   Would accept resuscitation attempts   No tube feeds if cognitively unaware   Review of Systems Appetite is fine--now eating more carefully Has lost 12# Bowels are fine---does occasionally see red blood on toilet paper (long standing hemorrhoid). Not in many months Some urinary urgency---wears pad for slight incontinence then Sleep is variable---a lot on her mind at this point Wears seat belt Teeth fine---keeps up with dentist No recent skin problems ---sees derm No back or joint pains of note. Intermittent sciatica (improved with better sleep position)    Objective:   Physical Exam  Constitutional: She is oriented to person, place, and time. She appears well-developed. No distress.  HENT:  Mouth/Throat: Oropharynx is clear and moist. No oropharyngeal exudate.  Neck: No thyromegaly present.  Cardiovascular: Normal rate, regular rhythm, normal heart sounds and intact distal pulses. Exam reveals no gallop.  No murmur heard. Respiratory: Effort normal and breath sounds normal. No respiratory distress. She has no wheezes. She has no rales.  GI: Soft. There is no tenderness.  Musculoskeletal: She exhibits no edema or tenderness.  Lymphadenopathy:    She has no cervical adenopathy.    Neurological: She is alert and oriented to person, place, and time.  President--- "Daisy Floro, Barack Obama, Bush" [phone] [id number] Recall 2/3   Skin: No rash noted. No erythema.  Psychiatric: She has a normal mood and affect. Her behavior is normal.           Assessment & Plan:

## 2018-03-20 ENCOUNTER — Ambulatory Visit (INDEPENDENT_AMBULATORY_CARE_PROVIDER_SITE_OTHER): Payer: Medicare Other | Admitting: Internal Medicine

## 2018-03-20 ENCOUNTER — Encounter: Payer: Self-pay | Admitting: Internal Medicine

## 2018-03-20 VITALS — BP 147/79 | Temp 98.5°F | Wt 181.1 lb

## 2018-03-20 DIAGNOSIS — A319 Mycobacterial infection, unspecified: Secondary | ICD-10-CM

## 2018-03-20 DIAGNOSIS — L02512 Cutaneous abscess of left hand: Secondary | ICD-10-CM

## 2018-03-20 NOTE — Progress Notes (Signed)
RFV: NTM - soft tissue infection of left hand Patient ID: KERISSA COIA, female   DOB: 25-Apr-1946, 72 y.o.   MRN: [id number]  HPI Libbie 72 yo hx of 1v CABG, HTN, GERD  Who sustained injury to her left hand roughly 1 year ago, while working out in the garden/woods, nail punctured her hand. She sought care initially where she received tetanus but did not need any I X D of hand initially. It healed relatively quickly but then over the course of next 6-9 months she started to have swelling of her index finger of left hand (mostly distally but then affected both distal and proximal compartment of the digit) to the point where she had limited range of motion. She went to hand surgeon this august for evaluation and felt that she may have ganglion cyst, requiring removal. He removed tan,granular tissue on 8/29. Surgical Pathology  descirbed the Volar mass left index finger proximal phalanx. Mass and swelling have persisted since puncture injury while outdoors September 2018. Unfortunately, tissue only sent for path and not AFB  Or other cx for further identificaiton. Her finger has healed though there is still some swelling to proximal finger and lon medial aspect there is still a residual nodule. No fever chills nightsweats no other lesions her finger appears to have healed from her surgery  dx:  A. SOFT TISSUE, LEFT INDEX FINGER; EXCISION:  - NECROTIZING GRANULOMATOUS INFLAMMATION CONTAINING ACID-FAST BACILLI.   Comment:  The soft tissue is composed of large granulomas with caseating necrosis.  Beaded acid-fast bacilli are identified within the caseous material. The  features are indicative of mycobacterial infection, most likely a  non-tuberculous Mycobacterium species. Identification by AFB culture is  recommended.   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 She has been in [place] for 12 yrs. Previously lived In [place].- lived in motor home for 5 yrs-traveled  the [place] with her husband. [place]  She is moving downsizing, since she moved her husband to memory care facility due to worsening alzheimers. 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  Outpatient Encounter Medications as of 03/20/2018  Medication Sig  . Calcium Carb-Cholecalciferol (CALCIUM 500 +D) 500-400 MG-UNIT TABS Take 1 tablet by mouth daily.  . Glucosamine 500 MG CAPS Take 500 mg by mouth 2 (two) times daily.  Marland Kitchen losartan (COZAAR) 50 MG tablet TAKE 1 TABLET EVERY DAY  . Omega-3 Fatty Acids (FISH OIL) 1000 MG CAPS Take 1,000 mg by mouth 2 (two) times daily.  Marland Kitchen omeprazole (PRILOSEC) 20 MG capsule TAKE 1 CAPSULE BY MOUTH DAILY.  Marland Kitchen aspirin EC 325 MG tablet Take 650 mg by mouth daily as needed for moderate pain.   No facility-administered encounter medications on file as of 03/20/2018.      Patient Active Problem List   Diagnosis Date Noted  . Left sided sciatica 08/07/2017  . Finger swelling 04/17/2017  . Coronary artery disease   . Osteoarthritis cervical spine 11/24/2016  . Chronic venous insufficiency 10/27/2015  . Advance directive discussed with patient 09/25/2014  . Allergic rhinitis due to pollen   . Routine general medical examination at a health care facility 09/27/2011  . Osteopenia 09/26/2010  . Essential hypertension, benign 05/27/2008  . GERD 09/28/2006     Health Maintenance Due  Topic Date Due  . COLONOSCOPY  06/27/2012    family history includes Heart attack in her maternal grandmother; Heart attack (  age of onset: 29) in her mother. Review of Systems Review of Systems  Constitutional: Negative for fever, chills, diaphoresis, activity change, appetite change, fatigue and unexpected weight change.  HENT: Negative for congestion, sore throat, rhinorrhea, sneezing, trouble swallowing and sinus pressure.  Eyes: Negative for photophobia and visual disturbance.  Respiratory: Negative for cough, chest  tightness, shortness of breath, wheezing and stridor.  Cardiovascular: Negative for chest pain, palpitations and leg swelling.  Gastrointestinal: Negative for nausea, vomiting, abdominal pain, diarrhea, constipation, blood in stool, abdominal distention and anal bleeding.  Genitourinary: Negative for dysuria, hematuria, flank pain and difficulty urinating.  Musculoskeletal: Negative for myalgias, back pain, joint swelling, arthralgias and gait problem.  Skin: Negative for color change, pallor, rash and wound.  Neurological: Negative for dizziness, tremors, weakness and light-headedness.  Hematological: Negative for adenopathy. Does not bruise/bleed easily.  Psychiatric/Behavioral: Negative for behavioral problems, confusion, sleep disturbance, dysphoric mood, decreased concentration and agitation.    Physical Exam   BP (!) 147/79   Temp 98.5 F (36.9 C)   Wt 181 lb 1.9 oz (82.2 kg)   BMI 31.33 kg/m    Physical Exam  Constitutional:  oriented to person, place, and time. appears well-developed and well-nourished. No distress.  HENT: [place]/AT, PERRLA, no scleral icterus Mouth/Throat: Oropharynx is clear and moist. No oropharyngeal exudate.  Cardiovascular: Normal rate, regular rhythm and normal heart sounds. Exam reveals no gallop and no friction rub.  No murmur heard.  Pulmonary/Chest: Effort normal and breath sounds normal. No respiratory distress.  has no wheezes.  Neck = supple, no nuchal rigidity Abdominal: Soft. Bowel sounds are normal.  exhibits no distension. There is no tenderness.  Lymphadenopathy: no cervical adenopathy. No axillary adenopathy Neurological: alert and oriented to person, place, and time.  Ext: small 1cm nodule to lateral aspect of 2nd digit of her left hand. Healed incision noted Skin: Skin is warm and dry. No rash noted. No erythema.  Psychiatric: a normal mood and affect.  behavior is normal.   CBC Lab Results  Component Value Date   WBC 5.9 12/18/2017    RBC 4.00 12/18/2017   HGB 12.0 12/18/2017   HCT 36.0 12/18/2017   PLT 265 12/18/2017   MCV 89.9 12/18/2017   MCH 29.9 12/18/2017   MCHC 33.2 12/18/2017   RDW 14.8 (H) 12/18/2017   LYMPHSABS 1.3 04/16/2017   MONOABS 0.5 04/16/2017   EOSABS 0.3 04/16/2017    BMET Lab Results  Component Value Date   NA 137 12/18/2017   K 4.0 12/18/2017   CL 105 12/18/2017   CO2 24 12/18/2017   GLUCOSE 106 (H) 12/18/2017   BUN 26 (H) 12/18/2017   CREATININE 0.82 12/18/2017   CALCIUM 9.2 12/18/2017   GFRNONAA >60 12/18/2017   GFRAA >60 12/18/2017      Assessment and Plan  NTM localized probably to area of injury sustained in Fall 2018- concern that there is still residual infection in her finger that will require treatment  - will discuss with dr Rudene Christians if any utility for repeat I x D to see if further specimen can be sent for AFB culture  - for identification process, can have pathology send tissue block to [facility] pathology for pcr to see if m.abscessus vs other NTM   - NTM can be highly resistant for which it would be optimal if can get tissue to be sent for culture for identificaiton and susceptibility  - lastly could try doing an empiric regimen if unable to get further specimen.  -  if repeat surgery, patient unable to do for 3 wks due to moving.

## 2018-03-21 LAB — BASIC METABOLIC PANEL
BUN: 18 mg/dL (ref 7–25)
CALCIUM: 9.8 mg/dL (ref 8.6–10.4)
CO2: 24 mmol/L (ref 20–32)
Chloride: 108 mmol/L (ref 98–110)
Creat: 0.87 mg/dL (ref 0.60–0.93)
Glucose, Bld: 98 mg/dL (ref 65–99)
Potassium: 4.6 mmol/L (ref 3.5–5.3)
Sodium: 142 mmol/L (ref 135–146)

## 2018-03-21 LAB — CBC WITH DIFFERENTIAL/PLATELET
Basophils Absolute: 60 cells/uL (ref 0–200)
Basophils Relative: 1.3 %
EOS PCT: 4.1 %
Eosinophils Absolute: 189 cells/uL (ref 15–500)
HEMATOCRIT: 33 % — AB (ref 35.0–45.0)
HEMOGLOBIN: 11 g/dL — AB (ref 11.7–15.5)
LYMPHS ABS: 1155 {cells}/uL (ref 850–3900)
MCH: 28.9 pg (ref 27.0–33.0)
MCHC: 33.3 g/dL (ref 32.0–36.0)
MCV: 86.8 fL (ref 80.0–100.0)
MONOS PCT: 8.3 %
MPV: 10.9 fL (ref 7.5–12.5)
NEUTROS ABS: 2815 {cells}/uL (ref 1500–7800)
Neutrophils Relative %: 61.2 %
Platelets: 263 10*3/uL (ref 140–400)
RBC: 3.8 10*6/uL (ref 3.80–5.10)
RDW: 14 % (ref 11.0–15.0)
Total Lymphocyte: 25.1 %
WBC mixed population: 382 cells/uL (ref 200–950)
WBC: 4.6 10*3/uL (ref 3.8–10.8)

## 2018-03-21 LAB — SEDIMENTATION RATE: Sed Rate: 6 mm/h (ref 0–30)

## 2018-03-21 LAB — C-REACTIVE PROTEIN: CRP: 4.5 mg/L (ref <8.0)

## 2018-04-03 ENCOUNTER — Ambulatory Visit
Admission: RE | Admit: 2018-04-03 | Discharge: 2018-04-03 | Disposition: A | Discharge status: Home / Self Care | Payer: Medicare Other | Source: Ambulatory Visit | Attending: Internal Medicine | Admitting: Internal Medicine

## 2018-04-03 DIAGNOSIS — R2232 Localized swelling, mass and lump, left upper limb: Secondary | ICD-10-CM | POA: Insufficient documentation

## 2018-04-03 DIAGNOSIS — M7989 Other specified soft tissue disorders: Secondary | ICD-10-CM | POA: Diagnosis not present

## 2018-04-03 DIAGNOSIS — L02512 Cutaneous abscess of left hand: Secondary | ICD-10-CM | POA: Insufficient documentation

## 2018-04-03 DIAGNOSIS — B9689 Other specified bacterial agents as the cause of diseases classified elsewhere: Secondary | ICD-10-CM | POA: Diagnosis not present

## 2018-04-03 DIAGNOSIS — M19041 Primary osteoarthritis, right hand: Secondary | ICD-10-CM | POA: Diagnosis not present

## 2018-04-11 DIAGNOSIS — Z23 Encounter for immunization: Secondary | ICD-10-CM | POA: Diagnosis not present

## 2018-05-06 ENCOUNTER — Ambulatory Visit (INDEPENDENT_AMBULATORY_CARE_PROVIDER_SITE_OTHER): Payer: Medicare Other | Admitting: Internal Medicine

## 2018-05-06 ENCOUNTER — Encounter: Payer: Self-pay | Admitting: Internal Medicine

## 2018-05-06 VITALS — BP 153/78 | HR 58 | Temp 98.1°F | Ht 63.0 in | Wt 182.0 lb

## 2018-05-06 DIAGNOSIS — A319 Mycobacterial infection, unspecified: Secondary | ICD-10-CM | POA: Diagnosis not present

## 2018-05-06 NOTE — Progress Notes (Signed)
RFV: presumed NTM soft tissue infection  Patient ID: Denise Henson, female   DOB: Jul 10, 1945, 72 y.o.   MRN: [id number]  HPI 72yo F with hx of swelling to Pointer finger, left hand s/p debridement but path did not reveal presumed ganglion cyst but rather, AFB on stain. No tissue was sent for culture at the time of surgery. Interestingly,still some residual nodule was left and has remained - unchanged since last appointment. Mri-post surgery suggests that tissue may still represent NTM infection. Outpatient Encounter Medications as of 05/06/2018  Medication Sig  . aspirin EC 325 MG tablet Take 650 mg by mouth daily as needed for moderate pain.  . Calcium Carb-Cholecalciferol (CALCIUM 500 +D) 500-400 MG-UNIT TABS Take 1 tablet by mouth daily.  . Glucosamine 500 MG CAPS Take 500 mg by mouth 2 (two) times daily.  Marland Kitchen losartan (COZAAR) 50 MG tablet TAKE 1 TABLET EVERY DAY  . omega-3 acid ethyl esters (LOVAZA) 1 g capsule Take 1 capsule by mouth 2 (two) times daily.  . Omega-3 Fatty Acids (FISH OIL) 1000 MG CAPS Take 1,000 mg by mouth 2 (two) times daily.  Marland Kitchen omeprazole (PRILOSEC) 20 MG capsule TAKE 1 CAPSULE BY MOUTH DAILY.   No facility-administered encounter medications on file as of 05/06/2018.      Patient Active Problem List   Diagnosis Date Noted  . Left sided sciatica 08/07/2017  . Finger swelling 04/17/2017  . Coronary artery disease   . Osteoarthritis cervical spine 11/24/2016  . Chronic venous insufficiency 10/27/2015  . Advance directive discussed with patient 09/25/2014  . Allergic rhinitis due to pollen   . Routine general medical examination at a health care facility 09/27/2011  . Osteopenia 09/26/2010  . Essential hypertension, benign 05/27/2008  . GERD 09/28/2006     Health Maintenance Due  Topic Date Due  . COLONOSCOPY  06/27/2012     Review of Systems 12 point ros is negative Physical Exam   BP (!) 153/78   Pulse (!) 58   Temp 98.1 F (36.7 C)   Ht 5'  3" (1.6 m)   Wt 182 lb (82.6 kg)   BMI 32.24 kg/m   gen = a xo by 4 in nad Ext= left pointer finger has firm medial nodule non tender no erythema, no ulcer CBC Lab Results  Component Value Date   WBC 4.6 03/20/2018   RBC 3.80 03/20/2018   HGB 11.0 (L) 03/20/2018   HCT 33.0 (L) 03/20/2018   PLT 263 03/20/2018   MCV 86.8 03/20/2018   MCH 28.9 03/20/2018   MCHC 33.3 03/20/2018   RDW 14.0 03/20/2018   LYMPHSABS 1,155 03/20/2018   MONOABS 0.5 04/16/2017   EOSABS 189 03/20/2018    BMET Lab Results  Component Value Date   NA 142 03/20/2018   K 4.6 03/20/2018   CL 108 03/20/2018   CO2 24 03/20/2018   GLUCOSE 98 03/20/2018   BUN 18 03/20/2018   CREATININE 0.87 03/20/2018   CALCIUM 9.8 03/20/2018   GFRNONAA >60 12/18/2017   GFRAA >60 12/18/2017      Assessment and Plan  Presumed SSTi with NTM = affected digit is unchanged over the last 2 months. Can ask to send tissue block for PCR at [facility] but could only give [place] identification. Though it may require more surgery, having repeat debridement with tissue sent to surgery for AFB culture and sensitivities would serve to debulk the affected area and also see if need to treat for oral abtx. Could do  a short course of azithromycin plus linezolid/tedzolid if symptoms worsen  Will reach out to her surgeon. No urgency at present for debridement  Plan to call tomorrow - not in the morning. Adult [facility]

## 2018-05-07 ENCOUNTER — Encounter: Payer: Self-pay | Admitting: Internal Medicine

## 2018-05-15 DIAGNOSIS — I2510 Atherosclerotic heart disease of native coronary artery without angina pectoris: Secondary | ICD-10-CM | POA: Diagnosis not present

## 2018-05-15 DIAGNOSIS — I351 Nonrheumatic aortic (valve) insufficiency: Secondary | ICD-10-CM | POA: Diagnosis not present

## 2018-05-15 DIAGNOSIS — I159 Secondary hypertension, unspecified: Secondary | ICD-10-CM | POA: Diagnosis not present

## 2018-05-15 DIAGNOSIS — E785 Hyperlipidemia, unspecified: Secondary | ICD-10-CM | POA: Diagnosis not present

## 2018-05-15 DIAGNOSIS — I25810 Atherosclerosis of coronary artery bypass graft(s) without angina pectoris: Secondary | ICD-10-CM | POA: Diagnosis not present

## 2018-09-10 ENCOUNTER — Ambulatory Visit (INDEPENDENT_AMBULATORY_CARE_PROVIDER_SITE_OTHER): Payer: Medicare Other | Admitting: Internal Medicine

## 2018-09-10 ENCOUNTER — Other Ambulatory Visit: Payer: Self-pay

## 2018-09-10 ENCOUNTER — Encounter: Payer: Self-pay | Admitting: Internal Medicine

## 2018-09-10 VITALS — BP 138/90 | HR 84 | Temp 98.0°F | Ht 63.0 in | Wt 187.0 lb

## 2018-09-10 DIAGNOSIS — R300 Dysuria: Secondary | ICD-10-CM

## 2018-09-10 LAB — POC URINALSYSI DIPSTICK (AUTOMATED)
GLUCOSE UA: NEGATIVE
Ketones, UA: NEGATIVE
Leukocytes, UA: NEGATIVE
Nitrite, UA: NEGATIVE
Protein, UA: NEGATIVE
RBC UA: NEGATIVE
UROBILINOGEN UA: 0.2 U/dL
pH, UA: 5.5 (ref 5.0–8.0)

## 2018-09-10 MED ORDER — SULFAMETHOXAZOLE-TRIMETHOPRIM 800-160 MG PO TABS: 1.0000 | ORAL_TABLET | Freq: Two times a day (BID) | ORAL | 1 refills | Status: DC

## 2018-09-10 NOTE — Progress Notes (Signed)
Subjective:    Patient ID: Denise Henson, female    DOB: 10-31-1945, 73 y.o.   MRN: [id number]  HPI Here due to burning dysuria--for 1 week or so Some right CVA tenderness as well---thought this was related to improper lifting Back goes back 2-3 weeks  Has increased fluids Can't even remember a past UTI  Current Outpatient Medications on File Prior to Visit  Medication Sig Dispense Refill  . aspirin EC 325 MG tablet Take 650 mg by mouth daily as needed for moderate pain.    . Calcium Carb-Cholecalciferol (CALCIUM 500 +D) 500-400 MG-UNIT TABS Take 1 tablet by mouth daily.    . Glucosamine 500 MG CAPS Take 500 mg by mouth 2 (two) times daily.    Marland Kitchen losartan (COZAAR) 50 MG tablet TAKE 1 TABLET EVERY DAY 90 tablet 3  . Omega-3 Fatty Acids (FISH OIL) 1000 MG CAPS Take 1,000 mg by mouth 2 (two) times daily.    Marland Kitchen omeprazole (PRILOSEC) 20 MG capsule TAKE 1 CAPSULE BY MOUTH DAILY. 90 capsule 3   No current facility-administered medications on file prior to visit.     Allergies  Allergen Reactions  . Lisinopril     cough  . Codeine Anxiety    jittery   . Morphine Anxiety    jittery    Past Medical History:  Diagnosis Date  . Allergic rhinitis due to pollen   . Aortic insufficiency 10/08   Mild; echocardiogram showed EF 55-65% with mild AI  . Asthma    as a child  . Coronary artery disease    One-vessel; s/p LIMA-LAD, [facility] 2007  . GERD (gastroesophageal reflux disease)   . Hx of pleural effusion 2007   after CABG  . Hyperlipidemia    Unable to tolerate simvastatin 40 due to myalgias  . Hypertension     Past Surgical History:  Procedure Laterality Date  . CESAREAN SECTION    . CHOLECYSTECTOMY    . CORONARY ARTERY BYPASS GRAFT  5/07   Readmitted for post op pleural effusions  . DOPPLER ECHOCARDIOGRAPHY     NV LV EF 1+ AI/MR  . EXCISION METACARPAL MASS Left 02/21/2018   Procedure: EXCISION METACARPAL MASS;  Surgeon: Hessie Knows, MD;  Location: [facility] ORS;   Service: Orthopedics;  Laterality: Left;  . Stress imaging  6/07   Negative EF 67%  . TONSILLECTOMY      Family History  Problem Relation Age of Onset  . Heart attack Mother 1  . Heart attack Maternal Grandmother   . Cancer Neg Hx        Breast or colon  . Diabetes Neg Hx   . Hypertension Neg Hx     Social History   Socioeconomic History  . Marital status: Married    Spouse name: Not on file  . Number of children: 1  . Years of education: Not on file  . Highest education level: Not on file  Occupational History  . 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 Financial resource strain: Not on file  . Food insecurity:    Worry: Not on file    Inability: Not on file  . Transportation needs:    Medical: Not on file    Non-medical: Not on file  Tobacco Use  . Smoking status: Former Smoker    Types: Cigarettes    Last attempt to quit: 06/26/1994    Years since  quitting: 24.2  . Smokeless tobacco: Never Used  Substance and Sexual Activity  . Alcohol use: Yes    Comment: one daily  . Drug use: No  . Sexual activity: Never  Lifestyle  . Physical activity:    Days per week: Not on file    Minutes per session: Not on file  . Stress: Not on file  Relationships  . Social connections:    Talks on phone: Not on file    Gets together: Not on file    Attends religious service: Not on file    Active member of club or organization: Not on file    Attends meetings of clubs or organizations: Not on file    Relationship status: Not on file  . Intimate partner violence:    Fear of current or ex partner: Not on file    Emotionally abused: Not on file    Physically abused: Not on file    Forced sexual activity: Not on file  Other Topics Concern  . Not on file  Social History Narrative   Has living will   Son [name], is health care POA   Would accept resuscitation attempts   No tube feeds if cognitively unaware    Review of Systems No fever--though may be up slightly No N/V Appetite is okay---but she feels "droopy"    Objective:   Physical Exam  Constitutional: She appears well-developed. No distress.  GI: Soft. She exhibits no distension. There is no abdominal tenderness. There is no rebound and no guarding.  Musculoskeletal:     Comments: No CVA tenderness---but pain in that area with external movement of right arm           Assessment & Plan:

## 2018-09-10 NOTE — Assessment & Plan Note (Signed)
Classic symptom of cystitis despite normal urine Will treat with septra (probably just 3 days)

## 2019-03-04 DIAGNOSIS — Z23 Encounter for immunization: Secondary | ICD-10-CM | POA: Diagnosis not present

## 2019-03-08 ENCOUNTER — Other Ambulatory Visit: Payer: Self-pay | Admitting: Internal Medicine

## 2019-03-19 ENCOUNTER — Other Ambulatory Visit: Payer: Self-pay

## 2019-03-19 ENCOUNTER — Encounter: Payer: Self-pay | Admitting: Gastroenterology

## 2019-03-19 ENCOUNTER — Encounter: Payer: Self-pay | Admitting: Internal Medicine

## 2019-03-19 ENCOUNTER — Ambulatory Visit (INDEPENDENT_AMBULATORY_CARE_PROVIDER_SITE_OTHER): Payer: Medicare Other | Admitting: Internal Medicine

## 2019-03-19 VITALS — BP 130/64 | HR 57 | Temp 98.4°F | Ht 64.0 in | Wt 197.0 lb

## 2019-03-19 DIAGNOSIS — Z7189 Other specified counseling: Secondary | ICD-10-CM | POA: Diagnosis not present

## 2019-03-19 DIAGNOSIS — Z0000 Encounter for general adult medical examination without abnormal findings: Secondary | ICD-10-CM

## 2019-03-19 DIAGNOSIS — Z1211 Encounter for screening for malignant neoplasm of colon: Secondary | ICD-10-CM

## 2019-03-19 DIAGNOSIS — I10 Essential (primary) hypertension: Secondary | ICD-10-CM | POA: Diagnosis not present

## 2019-03-19 DIAGNOSIS — F39 Unspecified mood [affective] disorder: Secondary | ICD-10-CM

## 2019-03-19 DIAGNOSIS — K219 Gastro-esophageal reflux disease without esophagitis: Secondary | ICD-10-CM

## 2019-03-19 DIAGNOSIS — J301 Allergic rhinitis due to pollen: Secondary | ICD-10-CM | POA: Diagnosis not present

## 2019-03-19 LAB — LIPID PANEL
Cholesterol: 211 mg/dL — ABNORMAL HIGH (ref 0–200)
HDL: 61.3 mg/dL (ref >39.00)
LDL Cholesterol: 125 mg/dL — ABNORMAL HIGH (ref 0–99)
NonHDL: 149.47
Total CHOL/HDL Ratio: 3
Triglycerides: 120 mg/dL (ref 0.0–149.0)
VLDL: 24 mg/dL (ref 0.0–40.0)

## 2019-03-19 LAB — COMPREHENSIVE METABOLIC PANEL
ALT: 19 U/L (ref 0–35)
AST: 19 U/L (ref 0–37)
Albumin: 4.1 g/dL (ref 3.5–5.2)
Alkaline Phosphatase: 76 U/L (ref 39–117)
BUN: 17 mg/dL (ref 6–23)
CO2: 27 mEq/L (ref 19–32)
Calcium: 9.5 mg/dL (ref 8.4–10.5)
Chloride: 102 mEq/L (ref 96–112)
Creatinine, Ser: 0.83 mg/dL (ref 0.40–1.20)
GFR: 67.2 mL/min (ref >60.00)
Glucose, Bld: 106 mg/dL — ABNORMAL HIGH (ref 70–99)
Potassium: 4.1 mEq/L (ref 3.5–5.1)
Sodium: 138 mEq/L (ref 135–145)
Total Bilirubin: 0.4 mg/dL (ref 0.2–1.2)
Total Protein: 6.7 g/dL (ref 6.0–8.3)

## 2019-03-19 LAB — CBC
HCT: 27.9 % — ABNORMAL LOW (ref 36.0–46.0)
Hemoglobin: 8.7 g/dL — ABNORMAL LOW (ref 12.0–15.0)
MCHC: 31.3 g/dL (ref 30.0–36.0)
MCV: 78.8 fl (ref 78.0–100.0)
Platelets: 297 10*3/uL (ref 150.0–400.0)
RBC: 3.54 Mil/uL — ABNORMAL LOW (ref 3.87–5.11)
RDW: 16.7 % — ABNORMAL HIGH (ref 11.5–15.5)
WBC: 6.7 10*3/uL (ref 4.0–10.5)

## 2019-03-19 LAB — T4, FREE: Free T4: 0.96 ng/dL (ref 0.60–1.60)

## 2019-03-19 MED ORDER — LOSARTAN POTASSIUM 50 MG PO TABS: 50.0000 mg | ORAL_TABLET | Freq: Every day | ORAL | 3 refills | Status: DC

## 2019-03-19 MED ORDER — OMEPRAZOLE 20 MG PO CPDR: 20.0000 mg | DELAYED_RELEASE_CAPSULE | Freq: Every day | ORAL | 3 refills | Status: DC

## 2019-03-19 NOTE — Assessment & Plan Note (Signed)
May have had allergic reaction to the loratadine Is going to try without any meds

## 2019-03-19 NOTE — Assessment & Plan Note (Signed)
Seems mostly like reactive anxiety Is doing okay with not being able to have real visit with husband--?some melancholy No meds indicated

## 2019-03-19 NOTE — Assessment & Plan Note (Signed)
I have personally reviewed the Medicare Annual Wellness questionnaire and have noted 1. The patient's medical and social history 2. Their use of alcohol, tobacco or illicit drugs 3. Their current medications and supplements 4. The patient's functional ability including ADL's, fall risks, home safety risks and hearing or visual             impairment. 5. Diet and physical activities 6. Evidence for depression or mood disorders  The patients weight, height, BMI and visual acuity have been recorded in the chart I have made referrals, counseling and provided education to the patient based review of the above and I have provided the pt with a written personalized care plan for preventive services.  I have provided you with a copy of your personalized plan for preventive services. Please take the time to review along with your updated medication list.  Had flu vaccine already Will look into shingrix at pharmacy Will refer for colonoscopy She will set up mammogram Discussed exercise

## 2019-03-19 NOTE — Assessment & Plan Note (Signed)
Quiet on PPI Discussed trying to skip doses

## 2019-03-19 NOTE — Progress Notes (Signed)
Subjective:    Patient ID: Denise Henson, female    DOB: 1946/02/11, 73 y.o.   MRN: [id number]  HPI Here for Medicare wellness visit and follow up of chronic health conditions Reviewed form and advanced directives Reviewed other doctors Does exercise regularly--but not as much as before Still enjoys scotch daily No tobacco Vision and hearing are fine No falls Independent with instrumental ADLs No apparent memory problems  Doesn't feel quite right but nothing clear cut No longer feels like she is going to pass out Also has had a rash on her legs for the past 2 months--doesn't know if it is related  Did start an antihistamine for post nasal drip last year Stopped taking the antihistamine 3 weeks ago--and started feeling some better Recurred and restarted the loratadine--and the dizziness recurred Rash also improved when she stopped  Has had some window visits with husband Caused her a lot of anxiety--he doesn't really make the connection He does talk to her on the phone--that goes better  No chest pain No SOB No syncope Occasionally feels heart racing---at rest and with activity (relates to anxiety) No headaches  Has general feeling of anxiety--mostly related to COVID, etc Not depressed---she feels it is more frustrated  Still on PPI daily This controls heartburn No dysphagia  Current Outpatient Medications on File Prior to Visit  Medication Sig Dispense Refill  . aspirin EC 325 MG tablet Take 650 mg by mouth daily as needed for moderate pain.    . Calcium Carb-Cholecalciferol (CALCIUM 500 +D) 500-400 MG-UNIT TABS Take 1 tablet by mouth daily.    . Glucosamine 500 MG CAPS Take 500 mg by mouth 2 (two) times daily.    Marland Kitchen losartan (COZAAR) 50 MG tablet TAKE 1 TABLET EVERY DAY 90 tablet 3  . Omega-3 Fatty Acids (FISH OIL) 1000 MG CAPS Take 1,000 mg by mouth 2 (two) times daily.    Marland Kitchen omeprazole (PRILOSEC) 20 MG capsule TAKE 1 CAPSULE BY MOUTH DAILY. 90 capsule 3   No  current facility-administered medications on file prior to visit.     Allergies  Allergen Reactions  . Lisinopril     cough  . Codeine Anxiety    jittery   . Morphine Anxiety    jittery    Past Medical History:  Diagnosis Date  . Allergic rhinitis due to pollen   . Aortic insufficiency 10/08   Mild; echocardiogram showed EF 55-65% with mild AI  . Asthma    as a child  . Coronary artery disease    One-vessel; s/p LIMA-LAD, [facility] 2007  . GERD (gastroesophageal reflux disease)   . Hx of pleural effusion 2007   after CABG  . Hyperlipidemia    Unable to tolerate simvastatin 40 due to myalgias  . Hypertension     Past Surgical History:  Procedure Laterality Date  . CESAREAN SECTION    . CHOLECYSTECTOMY    . CORONARY ARTERY BYPASS GRAFT  5/07   Readmitted for post op pleural effusions  . DOPPLER ECHOCARDIOGRAPHY     NV LV EF 1+ AI/MR  . EXCISION METACARPAL MASS Left 02/21/2018   Procedure: EXCISION METACARPAL MASS;  Surgeon: Hessie Knows, MD;  Location: [facility] ORS;  Service: Orthopedics;  Laterality: Left;  . Stress imaging  6/07   Negative EF 67%  . TONSILLECTOMY      Family History  Problem Relation Age of Onset  . Heart attack Mother 23  . Heart attack Maternal Grandmother   .  Cancer Neg Hx        Breast or colon  . Diabetes Neg Hx   . Hypertension Neg Hx     Social History   Socioeconomic History  . Marital status: Married    Spouse name: Not on file  . Number of children: 1  . Years of education: Not on file  . Highest education level: Not on file  Occupational History  . 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 Financial resource strain: Not on file  . Food insecurity    Worry: Not on file    Inability: Not on file  . Transportation needs    Medical: Not on file    Non-medical: Not on file  Tobacco Use  . Smoking status: Former Smoker    Types: Cigarettes    Quit date: 06/26/1994     Years since quitting: 24.7  . Smokeless tobacco: Never Used  Substance and Sexual Activity  . Alcohol use: Yes    Comment: one daily  . Drug use: No  . Sexual activity: Never  Lifestyle  . Physical activity    Days per week: Not on file    Minutes per session: Not on file  . Stress: Not on file  Relationships  . Social Herbalist on phone: Not on file    Gets together: Not on file    Attends religious service: Not on file    Active member of club or organization: Not on file    Attends meetings of clubs or organizations: Not on file    Relationship status: Not on file  . Intimate partner violence    Fear of current or ex partner: Not on file    Emotionally abused: Not on file    Physically abused: Not on file    Forced sexual activity: Not on file  Other Topics Concern  . Not on file  Social History Narrative   Has living will   Son [name], is health care POA   Would accept resuscitation attempts   No tube feeds if cognitively unaware   Review of Systems Appetite is good Weight is back up some Not sleeping well--relates to current anxiety. Has tried various techniques (avoiding TV, etc) Not really having daytime somnolence Bowels are fine--no blood. Finger lesion with Mycobacterium still has nodule--no pain (left 2nd) Teeth are fine---keeps up with dentist No suspicious skin lesions No other sig back or joint pain    Objective:   Physical Exam  Constitutional: She is oriented to person, place, and time. She appears well-developed. No distress.  HENT:  Mouth/Throat: Oropharynx is clear and moist. No oropharyngeal exudate.  Neck: No thyromegaly present.  Cardiovascular: Normal rate, regular rhythm, normal heart sounds and intact distal pulses. Exam reveals no gallop.  No murmur heard. Respiratory: Effort normal and breath sounds normal. No respiratory distress. She has no wheezes. She has no rales.  GI: Soft. There is no abdominal tenderness.   Musculoskeletal:        General: No tenderness or edema.  Lymphadenopathy:    She has no cervical adenopathy.  Neurological: She is alert and oriented to person, place, and time.  President---- "Daisy Floro, Barack Obama, Bush" [phone] [id number] Recall 3/3  Skin:  Several mostly macular lesions on calves           Assessment & Plan:

## 2019-03-19 NOTE — Assessment & Plan Note (Signed)
BP Readings from Last 3 Encounters:  03/19/19 130/64  09/10/18 138/90  05/06/18 (!) 153/78   Good control

## 2019-03-19 NOTE — Progress Notes (Signed)
Hearing Screening   Method: Audiometry   125Hz  250Hz  500Hz  1000Hz  2000Hz  3000Hz  4000Hz  6000Hz  8000Hz   Right ear:   20 20 20  20     Left ear:   20 20 20  20     Vision Screening Comments: Has appointment October 2020

## 2019-03-19 NOTE — Assessment & Plan Note (Signed)
See social history 

## 2019-03-20 ENCOUNTER — Other Ambulatory Visit: Payer: Self-pay

## 2019-03-20 DIAGNOSIS — D649 Anemia, unspecified: Secondary | ICD-10-CM

## 2019-03-21 DIAGNOSIS — Z209 Contact with and (suspected) exposure to unspecified communicable disease: Secondary | ICD-10-CM

## 2019-03-23 NOTE — Telephone Encounter (Signed)
Please add this to her blood test

## 2019-03-24 ENCOUNTER — Ambulatory Visit (AMBULATORY_SURGERY_CENTER): Payer: Self-pay | Admitting: *Deleted

## 2019-03-24 ENCOUNTER — Other Ambulatory Visit: Payer: Medicare Other

## 2019-03-24 ENCOUNTER — Other Ambulatory Visit: Payer: Self-pay

## 2019-03-24 VITALS — Temp 97.4°F | Ht 64.0 in | Wt 197.2 lb

## 2019-03-24 DIAGNOSIS — Z1211 Encounter for screening for malignant neoplasm of colon: Secondary | ICD-10-CM

## 2019-03-24 DIAGNOSIS — D649 Anemia, unspecified: Secondary | ICD-10-CM | POA: Diagnosis not present

## 2019-03-24 DIAGNOSIS — Z209 Contact with and (suspected) exposure to unspecified communicable disease: Secondary | ICD-10-CM

## 2019-03-24 LAB — SARS-COV-2 IGG: SARS-COV-2 IgG: 0.03

## 2019-03-24 MED ORDER — NA SULFATE-K SULFATE-MG SULF 17.5-3.13-1.6 GM/177ML PO SOLN: 1.0000 | Freq: Once | ORAL | 0 refills | Status: AC

## 2019-03-24 NOTE — Progress Notes (Signed)
No egg or soy allergy known to patient  No issues with past sedation with any surgeries  or procedures, no intubation problems  No diet pills per patient No home 02 use per patient  No blood thinners per patient  Pt denies issues with constipation  No A fib or A flutter  EMMI video sent to pt's e mail   Due to the COVID-19 pandemic we are asking patients to follow these guidelines. Please only bring one care partner. Please be aware that your care partner may wait in the car in the parking lot or if they feel like they will be too hot to wait in the car, they may wait in the lobby on the 4th floor. All care partners are required to wear a mask the entire time (we do not have any that we can provide them), they need to practice social distancing, and we will do a Covid check for all patient's and care partners when you arrive. Also we will check their temperature and your temperature. If the care partner waits in their car they need to stay in the parking lot the entire time and we will call them on their cell phone when the patient is ready for discharge so they can bring the car to the front of the building. Also all patient's will need to wear a mask into building.   Pt. Unsure about having EGD ,has an appointment on 03/31/19 and will be discussing procedure at time. Pt. Instructed to hold otc iron 5 days prior to procedure.

## 2019-03-25 LAB — IRON,TIBC AND FERRITIN PANEL
%SAT: 13 % (calc) — ABNORMAL LOW (ref 16–45)
Ferritin: 5 ng/mL — ABNORMAL LOW (ref 16–288)
Iron: 66 ug/dL (ref 45–160)
TIBC: 492 mcg/dL (calc) — ABNORMAL HIGH (ref 250–450)

## 2019-03-27 DIAGNOSIS — H2513 Age-related nuclear cataract, bilateral: Secondary | ICD-10-CM | POA: Diagnosis not present

## 2019-03-31 ENCOUNTER — Encounter: Payer: Self-pay | Admitting: Physician Assistant

## 2019-03-31 ENCOUNTER — Other Ambulatory Visit: Payer: Self-pay

## 2019-03-31 ENCOUNTER — Ambulatory Visit (INDEPENDENT_AMBULATORY_CARE_PROVIDER_SITE_OTHER): Payer: Medicare Other | Admitting: Physician Assistant

## 2019-03-31 VITALS — BP 150/60 | HR 84 | Temp 97.1°F | Ht 64.0 in | Wt 199.0 lb

## 2019-03-31 DIAGNOSIS — D509 Iron deficiency anemia, unspecified: Secondary | ICD-10-CM

## 2019-03-31 NOTE — Progress Notes (Signed)
Agree with assessment with the following thoughts. The patient has a severe iron deficiency anemia. This is very unlikely to be from dietary change in regards to the patient subjective concerns. Her GI tract needs to be evaluated to ensure no evidence of a GI tract malignancy. Delay in these exams could delay a diagnosis that could affect her prognosis if she does have a GI tract malignancy. EGD and colonoscopy are strongly recommended to be done at this time. If she declines now hopefully she reconsiders and will await results of her follow up labs.   Dr. Silvio Pate, Juluis Rainier, patient is declining EGD and colonoscopy for severe iron deficiency anemia. We are ready to proceed if she reconsiders in the future. Thanks

## 2019-03-31 NOTE — Patient Instructions (Signed)
If you are age 74 or older, your body mass index should be between 23-30. Your Body mass index is 34.16 kg/m. If this is out of the aforementioned range listed, please consider follow up with your Primary Care Provider.  If you are age 50 or younger, your body mass index should be between 19-25. Your Body mass index is 34.16 kg/m. If this is out of the aformentioned range listed, please consider follow up with your Primary Care Provider.   Your provider has requested that you go to the basement level for lab work in three weeks.  This will be April 21, 2019.  Continue Iron for now.    We have canceled you colon/egd for now at your request.  Thank you for choosing me and [facility].    Ellouise Newer, PA-C

## 2019-03-31 NOTE — Progress Notes (Signed)
Chief Complaint: IDA  HPI:    Denise Henson is a 73 year old Caucasian female with a past medical history as listed below, signed to Dr. Havery Moros, who was referred to me by Venia Carbon, MD for a complaint of IDA.      06/27/2002 colonoscopy, no report available.    03/19/2019 CBC with a hemoglobin of 8.7.  03/24/2019 iron studies with a percent saturation of 13 and a ferritin of 5.  Labs were reviewed by Dr. Havery Moros.  Patient was told she had severe iron deficiency which was causing her anemia.  Told to start ferrous sulfate 325 mg p.o. twice daily.  She had already been set up for an EGD and colonoscopy.    Today, the patient presents clinic and tells me that she has been on the iron twice daily for the past week and believes she is starting to feel some better.  Explains that she is having no GI symptoms and has seen no sign of blood in her stool or anywhere else.  Patient is convinced that this iron deficiency anemia is related to her diet because she has been taking sole care of her husband who is now in the memory unit for a few years and has not been paying attention to herself or what she is eating.  Patient is confident that this anemia is just from her diet.  She does not really feel like she needs further evaluation.  Also tells me that she would like to continue on the iron so that she can get stronger before undergoing procedures.    Denies fever, chills, heartburn, reflux weight loss, change in bowel habits or abdominal pain.   Past Medical History:  Diagnosis Date  . Allergic rhinitis due to pollen   . Allergy   . Anemia   . Aortic insufficiency 10/08   Mild; echocardiogram showed EF 55-65% with mild AI  . Arthritis   . Asthma    as a child  . Coronary artery disease    One-vessel; s/p LIMA-LAD, [facility] 2007  . GERD (gastroesophageal reflux disease)   . Hx of pleural effusion 2007   after CABG  . Hyperlipidemia    Unable to tolerate simvastatin 40 due to  myalgias  . Hypertension     Past Surgical History:  Procedure Laterality Date  . CESAREAN SECTION    . CHOLECYSTECTOMY    . COLONOSCOPY    . CORONARY ARTERY BYPASS GRAFT  5/07   Readmitted for post op pleural effusions  . DOPPLER ECHOCARDIOGRAPHY     NV LV EF 1+ AI/MR  . EXCISION METACARPAL MASS Left 02/21/2018   Procedure: EXCISION METACARPAL MASS;  Surgeon: Hessie Knows, MD;  Location: [facility] ORS;  Service: Orthopedics;  Laterality: Left;  . Stress imaging  6/07   Negative EF 67%  . TONSILLECTOMY      Current Outpatient Medications  Medication Sig Dispense Refill  . aspirin EC 325 MG tablet Take 650 mg by mouth daily as needed for moderate pain.    . Calcium Carb-Cholecalciferol (CALCIUM 500 +D) 500-400 MG-UNIT TABS Take 1 tablet by mouth daily.    . Ferrous Sulfate (IRON PO) Take 35 mg by mouth 2 (two) times daily.     . Glucosamine 500 MG CAPS Take 500 mg by mouth 2 (two) times daily.    Marland Kitchen losartan (COZAAR) 50 MG tablet Take 1 tablet (50 mg total) by mouth daily. 90 tablet 3  . Multiple Vitamin (MULTIVITAMIN PO) Take by  mouth.    . Omega-3 Fatty Acids (FISH OIL) 1000 MG CAPS Take 1,000 mg by mouth 2 (two) times daily.    Marland Kitchen omeprazole (PRILOSEC) 20 MG capsule Take 1 capsule (20 mg total) by mouth daily. 90 capsule 3   No current facility-administered medications for this visit.     Allergies as of 03/31/2019 - Review Complete 03/31/2019  Allergen Reaction Noted  . Lisinopril    . Codeine Anxiety   . Morphine Anxiety 09/28/2006    Family History  Problem Relation Age of Onset  . Heart attack Mother 17  . Heart attack Maternal Grandmother   . Cancer Neg Hx        Breast or colon  . Diabetes Neg Hx   . Hypertension Neg Hx   . Colon cancer Neg Hx   . Colon polyps Neg Hx   . Esophageal cancer Neg Hx   . Rectal cancer Neg Hx   . Stomach cancer Neg Hx     Social History   Socioeconomic History  . Marital status: Married    Spouse name: Not on file  . Number of  children: 1  . Years of education: Not on file  . Highest education level: Not on file  Occupational History  . 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 Financial resource strain: Not on file  . Food insecurity    Worry: Not on file    Inability: Not on file  . Transportation needs    Medical: Not on file    Non-medical: Not on file  Tobacco Use  . Smoking status: Former Smoker    Types: Cigarettes    Quit date: 06/26/1994    Years since quitting: 24.7  . Smokeless tobacco: Never Used  Substance and Sexual Activity  . Alcohol use: Yes    Comment: one daily  . Drug use: No  . Sexual activity: Never  Lifestyle  . Physical activity    Days per week: Not on file    Minutes per session: Not on file  . Stress: Not on file  Relationships  . Social Herbalist on phone: Not on file    Gets together: Not on file    Attends religious service: Not on file    Active member of club or organization: Not on file    Attends meetings of clubs or organizations: Not on file    Relationship status: Not on file  . Intimate partner violence    Fear of current or ex partner: Not on file    Emotionally abused: Not on file    Physically abused: Not on file    Forced sexual activity: Not on file  Other Topics Concern  . Not on file  Social History Narrative   Has living will   Son Denise Henson, is health care POA   Would accept resuscitation attempts   No tube feeds if cognitively unaware    Review of Systems:    Constitutional: No weight loss, fever or chills Skin: No rash  Cardiovascular: No chest pain   Respiratory: No SOB  Gastrointestinal: See HPI and otherwise negative Genitourinary: No dysuria  Neurological: No headache, dizziness or syncope Musculoskeletal: No new muscle or joint pain Hematologic: No bleeding or bruising Psychiatric: No history of depression or anxiety   Physical Exam:  Vital signs: BP (!)  150/60   Pulse 84   Temp (!)  97.1 F (36.2 C)   Ht 5\' 4"  (1.626 m)   Wt 199 lb (90.3 kg)   BMI 34.16 kg/m   Constitutional:  Caucasian female appears to be in NAD, Well developed, Well nourished, alert and cooperative Head:  Normocephalic and atraumatic. Eyes:   PEERL, EOMI. No icterus. Conjunctiva pink. Ears:  Normal auditory acuity. Neck:  Supple Throat: Oral cavity and pharynx without inflammation, swelling or lesion.  Respiratory: Respirations even and unlabored. Lungs clear to auscultation bilaterally.   No wheezes, crackles, or rhonchi.  Cardiovascular: Normal S1, S2. No MRG. Regular rate and rhythm. No peripheral edema, cyanosis or pallor.  Gastrointestinal:  Soft, nondistended, nontender. No rebound or guarding. Normal bowel sounds. No appreciable masses or hepatomegaly. Rectal:  Not performed.  Msk:  Symmetrical without gross deformities. Without edema, no deformity or joint abnormality.  Neurologic:  Alert and  oriented x4;  grossly normal neurologically.  Skin:   Dry and intact without significant lesions or rashes. Psychiatric: Demonstrates good judgement and reason without abnormal affect or behaviors.  RELEVANT LABS AND IMAGING: CBC    Component Value Date/Time   WBC 6.7 03/19/2019 1033   RBC 3.54 (L) 03/19/2019 1033   HGB 8.7 (L) 03/19/2019 1033   HCT 27.9 (L) 03/19/2019 1033   PLT 297.0 03/19/2019 1033   MCV 78.8 03/19/2019 1033   MCH 28.9 03/20/2018 1006   MCHC 31.3 03/19/2019 1033   RDW 16.7 (H) 03/19/2019 1033   LYMPHSABS 1,155 03/20/2018 1006   MONOABS 0.5 04/16/2017 1458   EOSABS 189 03/20/2018 1006   BASOSABS 60 03/20/2018 1006    CMP     Component Value Date/Time   NA 138 03/19/2019 1033   K 4.1 03/19/2019 1033   CL 102 03/19/2019 1033   CO2 27 03/19/2019 1033   GLUCOSE 106 (H) 03/19/2019 1033   BUN 17 03/19/2019 1033   CREATININE 0.83 03/19/2019 1033   CREATININE 0.87 03/20/2018 1006   CALCIUM 9.5 03/19/2019 1033   PROT 6.7 03/19/2019  1033   ALBUMIN 4.1 03/19/2019 1033   AST 19 03/19/2019 1033   ALT 19 03/19/2019 1033   ALKPHOS 76 03/19/2019 1033   BILITOT 0.4 03/19/2019 1033   GFRNONAA >60 12/18/2017 1155   GFRAA >60 12/18/2017 1155    Assessment: 1.  IDA: 03/19/2019 hemoglobin 8.7, iron studies show a low ferritin, started on iron a week ago, is starting to feel some better per her, no GI symptoms, last colonoscopy in 2004; consider GI source of blood loss versus other  Plan: 1.  Had a long discussion with the patient today.  She presented to clinic and told me she did not want to have either procedure done because she just feels weak and tired and wants to stay on the iron for a while to build up her strength before undergoing anything.  Patient came in with the plan and told me what she wanted to do.  Explained that she wants to have her labs rechecked in a month, including a CMP as her glucose has been high but "it has not been fasting".  She would then like to wait another month and be on iron to decide whether or not she wants to go through with procedures. 2.  I did discuss with the patient that worse case scenario she has a cancer in her GI system which is causing her to be anemic.  Delaying procedures to evaluate would put her at increased risk for mortality.  She verbalized understanding and  still declined EGD and colonoscopy today. 3.  Explained to the patient that we can see what her labs do but regardless would still recommend that she have procedures.  She is aware and will call us when she would like these done (if she would like these done). 4.  Canceled EGD and colonoscopy which were scheduled with Dr. Havery Moros. 5.  Ordered repeat CBC, CMP and iron studies including ferritin to be done in 3 weeks at [facility], per patient request.  Ellouise Newer, PA-C [facility] 03/31/2019, 1:57 PM  Cc: Venia Carbon, MD

## 2019-04-06 ENCOUNTER — Telehealth: Payer: Self-pay | Admitting: Internal Medicine

## 2019-04-06 NOTE — Telephone Encounter (Signed)
Spoke to her after getting the note from Dr Havery Moros. To clarify, she wanted to cancel the 2 tests for now and wait till after iron therapy and repeat blood work She will accept the colonoscopy regardless---but would like to avoid the EGD if her hemoglobin has come up considerably.

## 2019-04-07 NOTE — Telephone Encounter (Signed)
Okay thanks for the update.  We will touch base with her to get the colonoscopy on the schedule. If it is negative or her anemia persists, will discuss doing EGD to follow it. Thanks for the follow up.  Keitha Butte, can you touch base with this patient to schedule a colonoscopy at some point when she is agreeable. Thanks

## 2019-04-08 NOTE — Telephone Encounter (Signed)
Called patient to see if I could schedule her for a colonoscop., She said she had an office visit with Ellouise Newer PA and they agreed she could stay on the oral Iron for a month and then get repeat labs to see where she was at first. If she is possibly going to have an Colonoscopy and EGD, she wants to wait for the above to be done,  and if both are needed, have them at the same time. She wants me to call her in a month.

## 2019-04-08 NOTE — Telephone Encounter (Signed)
Okay; thanks.

## 2019-04-14 ENCOUNTER — Encounter: Payer: Medicare Other | Admitting: Gastroenterology

## 2019-04-25 ENCOUNTER — Other Ambulatory Visit: Payer: Self-pay

## 2019-04-25 ENCOUNTER — Other Ambulatory Visit (INDEPENDENT_AMBULATORY_CARE_PROVIDER_SITE_OTHER): Payer: Medicare Other

## 2019-04-25 DIAGNOSIS — D509 Iron deficiency anemia, unspecified: Secondary | ICD-10-CM | POA: Diagnosis not present

## 2019-04-25 LAB — COMPREHENSIVE METABOLIC PANEL
ALT: 29 U/L (ref 0–35)
AST: 25 U/L (ref 0–37)
Albumin: 3.9 g/dL (ref 3.5–5.2)
Alkaline Phosphatase: 74 U/L (ref 39–117)
BUN: 20 mg/dL (ref 6–23)
CO2: 28 mEq/L (ref 19–32)
Calcium: 9.1 mg/dL (ref 8.4–10.5)
Chloride: 106 mEq/L (ref 96–112)
Creatinine, Ser: 0.9 mg/dL (ref 0.40–1.20)
GFR: 61.19 mL/min (ref >60.00)
Glucose, Bld: 98 mg/dL (ref 70–99)
Potassium: 4.3 mEq/L (ref 3.5–5.1)
Sodium: 141 mEq/L (ref 135–145)
Total Bilirubin: 0.4 mg/dL (ref 0.2–1.2)
Total Protein: 6.5 g/dL (ref 6.0–8.3)

## 2019-04-25 LAB — CBC WITH DIFFERENTIAL/PLATELET
Basophils Absolute: 0.1 10*3/uL (ref 0.0–0.1)
Basophils Relative: 1.3 % (ref 0.0–3.0)
Eosinophils Absolute: 0.4 10*3/uL (ref 0.0–0.7)
Eosinophils Relative: 5.9 % — ABNORMAL HIGH (ref 0.0–5.0)
HCT: 36.1 % (ref 36.0–46.0)
Hemoglobin: 11.4 g/dL — ABNORMAL LOW (ref 12.0–15.0)
Lymphocytes Relative: 24.9 % (ref 12.0–46.0)
Lymphs Abs: 1.5 10*3/uL (ref 0.7–4.0)
MCHC: 31.5 g/dL (ref 30.0–36.0)
MCV: 86.6 fl (ref 78.0–100.0)
Monocytes Absolute: 0.5 10*3/uL (ref 0.1–1.0)
Monocytes Relative: 8.3 % (ref 3.0–12.0)
Neutro Abs: 3.6 10*3/uL (ref 1.4–7.7)
Neutrophils Relative %: 59.6 % (ref 43.0–77.0)
Platelets: 246 10*3/uL (ref 150.0–400.0)
RBC: 4.17 Mil/uL (ref 3.87–5.11)
RDW: 25.9 % — ABNORMAL HIGH (ref 11.5–15.5)
WBC: 6 10*3/uL (ref 4.0–10.5)

## 2019-04-25 LAB — IBC + FERRITIN
Ferritin: 21.6 ng/mL (ref 10.0–291.0)
Iron: 128 ug/dL (ref 42–145)
Saturation Ratios: 29.8 % (ref 20.0–50.0)
Transferrin: 307 mg/dL (ref 212.0–360.0)

## 2019-04-28 ENCOUNTER — Encounter: Payer: Self-pay | Admitting: Gastroenterology

## 2019-06-13 ENCOUNTER — Encounter: Payer: Medicare Other | Admitting: Gastroenterology

## 2019-07-10 DIAGNOSIS — Z23 Encounter for immunization: Secondary | ICD-10-CM | POA: Diagnosis not present

## 2019-08-07 DIAGNOSIS — Z23 Encounter for immunization: Secondary | ICD-10-CM | POA: Diagnosis not present

## 2019-09-02 DIAGNOSIS — D649 Anemia, unspecified: Secondary | ICD-10-CM

## 2019-09-02 NOTE — Telephone Encounter (Signed)
I left a detailed message on patient's voice mail to call back and schedule lab appointment.

## 2019-09-02 NOTE — Telephone Encounter (Signed)
Please give her an appointment time to get her lab drawn

## 2019-09-05 ENCOUNTER — Other Ambulatory Visit: Payer: Self-pay

## 2019-09-05 ENCOUNTER — Other Ambulatory Visit (INDEPENDENT_AMBULATORY_CARE_PROVIDER_SITE_OTHER): Payer: Medicare Other

## 2019-09-05 DIAGNOSIS — D649 Anemia, unspecified: Secondary | ICD-10-CM

## 2019-09-05 LAB — CBC
HCT: 39.2 % (ref 36.0–46.0)
Hemoglobin: 13.5 g/dL (ref 12.0–15.0)
MCHC: 34.3 g/dL (ref 30.0–36.0)
MCV: 99.2 fl (ref 78.0–100.0)
Platelets: 243 10*3/uL (ref 150.0–400.0)
RBC: 3.95 Mil/uL (ref 3.87–5.11)
RDW: 14.4 % (ref 11.5–15.5)
WBC: 5.6 10*3/uL (ref 4.0–10.5)

## 2019-11-06 ENCOUNTER — Other Ambulatory Visit: Payer: Self-pay

## 2019-11-06 ENCOUNTER — Emergency Department
Admission: EM | Admit: 2019-11-06 | Discharge: 2019-11-07 | Disposition: A | Discharge status: Home / Self Care | Payer: Medicare Other | Source: Home / Self Care | Attending: Emergency Medicine | Admitting: Emergency Medicine

## 2019-11-06 ENCOUNTER — Emergency Department: Payer: Medicare Other

## 2019-11-06 ENCOUNTER — Encounter: Payer: Self-pay | Admitting: Emergency Medicine

## 2019-11-06 ENCOUNTER — Other Ambulatory Visit: Payer: Self-pay | Admitting: Internal Medicine

## 2019-11-06 ENCOUNTER — Ambulatory Visit
Admission: EM | Admit: 2019-11-06 | Discharge: 2019-11-06 | Disposition: A | Discharge status: Home / Self Care | Payer: Medicare Other | Attending: Emergency Medicine | Admitting: Emergency Medicine

## 2019-11-06 DIAGNOSIS — N136 Pyonephrosis: Secondary | ICD-10-CM | POA: Diagnosis not present

## 2019-11-06 DIAGNOSIS — R197 Diarrhea, unspecified: Secondary | ICD-10-CM

## 2019-11-06 DIAGNOSIS — K219 Gastro-esophageal reflux disease without esophagitis: Secondary | ICD-10-CM | POA: Diagnosis not present

## 2019-11-06 DIAGNOSIS — I10 Essential (primary) hypertension: Secondary | ICD-10-CM | POA: Diagnosis not present

## 2019-11-06 DIAGNOSIS — Z951 Presence of aortocoronary bypass graft: Secondary | ICD-10-CM | POA: Insufficient documentation

## 2019-11-06 DIAGNOSIS — Z87891 Personal history of nicotine dependence: Secondary | ICD-10-CM | POA: Diagnosis not present

## 2019-11-06 DIAGNOSIS — I700 Atherosclerosis of aorta: Secondary | ICD-10-CM | POA: Diagnosis not present

## 2019-11-06 DIAGNOSIS — I119 Hypertensive heart disease without heart failure: Secondary | ICD-10-CM | POA: Insufficient documentation

## 2019-11-06 DIAGNOSIS — R1032 Left lower quadrant pain: Secondary | ICD-10-CM | POA: Diagnosis not present

## 2019-11-06 DIAGNOSIS — Z79899 Other long term (current) drug therapy: Secondary | ICD-10-CM | POA: Insufficient documentation

## 2019-11-06 DIAGNOSIS — I2510 Atherosclerotic heart disease of native coronary artery without angina pectoris: Secondary | ICD-10-CM | POA: Insufficient documentation

## 2019-11-06 DIAGNOSIS — N179 Acute kidney failure, unspecified: Secondary | ICD-10-CM | POA: Diagnosis not present

## 2019-11-06 DIAGNOSIS — Z20822 Contact with and (suspected) exposure to covid-19: Secondary | ICD-10-CM | POA: Diagnosis not present

## 2019-11-06 DIAGNOSIS — I160 Hypertensive urgency: Secondary | ICD-10-CM

## 2019-11-06 DIAGNOSIS — R112 Nausea with vomiting, unspecified: Secondary | ICD-10-CM | POA: Insufficient documentation

## 2019-11-06 DIAGNOSIS — Z7982 Long term (current) use of aspirin: Secondary | ICD-10-CM | POA: Diagnosis not present

## 2019-11-06 DIAGNOSIS — E785 Hyperlipidemia, unspecified: Secondary | ICD-10-CM | POA: Diagnosis not present

## 2019-11-06 DIAGNOSIS — N200 Calculus of kidney: Secondary | ICD-10-CM

## 2019-11-06 DIAGNOSIS — K5730 Diverticulosis of large intestine without perforation or abscess without bleeding: Secondary | ICD-10-CM | POA: Diagnosis not present

## 2019-11-06 DIAGNOSIS — N10 Acute tubulo-interstitial nephritis: Secondary | ICD-10-CM | POA: Diagnosis present

## 2019-11-06 DIAGNOSIS — K760 Fatty (change of) liver, not elsewhere classified: Secondary | ICD-10-CM | POA: Diagnosis not present

## 2019-11-06 DIAGNOSIS — K449 Diaphragmatic hernia without obstruction or gangrene: Secondary | ICD-10-CM | POA: Diagnosis not present

## 2019-11-06 DIAGNOSIS — N132 Hydronephrosis with renal and ureteral calculous obstruction: Secondary | ICD-10-CM | POA: Diagnosis not present

## 2019-11-06 DIAGNOSIS — M1990 Unspecified osteoarthritis, unspecified site: Secondary | ICD-10-CM | POA: Diagnosis not present

## 2019-11-06 LAB — COMPREHENSIVE METABOLIC PANEL
ALT: 51 U/L — ABNORMAL HIGH (ref 0–44)
AST: 40 U/L (ref 15–41)
Albumin: 4 g/dL (ref 3.5–5.0)
Alkaline Phosphatase: 69 U/L (ref 38–126)
Anion gap: 10 (ref 5–15)
BUN: 19 mg/dL (ref 8–23)
CO2: 24 mmol/L (ref 22–32)
Calcium: 9.5 mg/dL (ref 8.9–10.3)
Chloride: 104 mmol/L (ref 98–111)
Creatinine, Ser: 1.16 mg/dL — ABNORMAL HIGH (ref 0.44–1.00)
GFR calc Af Amer: 54 mL/min — ABNORMAL LOW (ref >60)
GFR calc non Af Amer: 46 mL/min — ABNORMAL LOW (ref >60)
Glucose, Bld: 166 mg/dL — ABNORMAL HIGH (ref 70–99)
Potassium: 4.4 mmol/L (ref 3.5–5.1)
Sodium: 138 mmol/L (ref 135–145)
Total Bilirubin: 0.7 mg/dL (ref 0.3–1.2)
Total Protein: 7.2 g/dL (ref 6.5–8.1)

## 2019-11-06 LAB — CBC
HCT: 40.2 % (ref 36.0–46.0)
Hemoglobin: 13.7 g/dL (ref 12.0–15.0)
MCH: 33 pg (ref 26.0–34.0)
MCHC: 34.1 g/dL (ref 30.0–36.0)
MCV: 96.9 fL (ref 80.0–100.0)
Platelets: 234 10*3/uL (ref 150–400)
RBC: 4.15 MIL/uL (ref 3.87–5.11)
RDW: 13.1 % (ref 11.5–15.5)
WBC: 9.4 10*3/uL (ref 4.0–10.5)
nRBC: 0 % (ref 0.0–0.2)

## 2019-11-06 LAB — URINALYSIS, COMPLETE (UACMP) WITH MICROSCOPIC
Bilirubin Urine: NEGATIVE
Glucose, UA: 50 mg/dL — AB
Hgb urine dipstick: NEGATIVE
Ketones, ur: 20 mg/dL — AB
Nitrite: NEGATIVE
Protein, ur: 30 mg/dL — AB
Specific Gravity, Urine: 1.027 (ref 1.005–1.030)
pH: 5 (ref 5.0–8.0)

## 2019-11-06 LAB — POCT URINALYSIS DIP (MANUAL ENTRY)
Bilirubin, UA: NEGATIVE
Glucose, UA: 100 mg/dL — AB
Leukocytes, UA: NEGATIVE
Nitrite, UA: NEGATIVE
Protein Ur, POC: 100 mg/dL — AB
Spec Grav, UA: >=1.03 — AB (ref 1.010–1.025)
Urobilinogen, UA: 0.2 E.U./dL
pH, UA: 5.5 (ref 5.0–8.0)

## 2019-11-06 LAB — LIPASE, BLOOD: Lipase: 25 U/L (ref 11–51)

## 2019-11-06 MED ORDER — IOHEXOL 300 MG/ML  SOLN
100.0000 mL | Freq: Once | INTRAMUSCULAR | Status: AC | PRN
  Administered 2019-11-06: 100 mL via INTRAVENOUS

## 2019-11-06 MED ORDER — DICYCLOMINE HCL 10 MG PO CAPS
20.0000 mg | ORAL_CAPSULE | Freq: Once | ORAL | Status: AC
  Administered 2019-11-06: 20 mg via ORAL
  Filled 2019-11-06: qty 2

## 2019-11-06 MED ORDER — ONDANSETRON 4 MG PO TBDP
4.0000 mg | ORAL_TABLET | Freq: Once | ORAL | Status: AC
  Administered 2019-11-06: 4 mg via ORAL

## 2019-11-06 MED ORDER — HALOPERIDOL LACTATE 5 MG/ML IJ SOLN
2.0000 mg | Freq: Once | INTRAMUSCULAR | Status: AC
  Administered 2019-11-06: 2 mg via INTRAVENOUS
  Filled 2019-11-06: qty 1

## 2019-11-06 MED ORDER — ONDANSETRON HCL 4 MG/2ML IJ SOLN
4.0000 mg | Freq: Once | INTRAMUSCULAR | Status: AC
  Administered 2019-11-06: 4 mg via INTRAVENOUS
  Filled 2019-11-06: qty 2

## 2019-11-06 MED ORDER — SODIUM CHLORIDE 0.9 % IV BOLUS
1000.0000 mL | Freq: Once | INTRAVENOUS | Status: AC
  Administered 2019-11-06: 1000 mL via INTRAVENOUS

## 2019-11-06 NOTE — ED Triage Notes (Addendum)
Reports LLQ pain, emesis, nausea and urinary frequency that started 5AM today.  Given zofran 1411 today at Urgent Care before being sent to ED.

## 2019-11-06 NOTE — ED Triage Notes (Signed)
Pt c/o LLQ pain, vomiting, dysuria, diarrhea, urinary frequency and retention. Started about 5 am this morning. Denies fever.

## 2019-11-06 NOTE — ED Provider Notes (Signed)
Roderic Palau    CSN: [id number] Arrival date & time: 11/06/19  1352      History   Chief Complaint Chief Complaint  Patient presents with  . Abdominal Pain  . Emesis    HPI Denise Henson is a 74 y.o. female.   Patient presents with left lower abdominal pain, vomiting, diarrhea, dysuria, and urinary frequency since this morning.  She reports 5 episodes of vomiting and 3 episodes of diarrhea today.  She states she is "unable to keep anything down".  She denies fever, chills, dizziness, focal weakness, chest pain, shortness of breath, or other symptoms.  The history is provided by the patient.    Past Medical History:  Diagnosis Date  . Allergic rhinitis due to pollen   . Allergy   . Anemia   . Aortic insufficiency 10/08   Mild; echocardiogram showed EF 55-65% with mild AI  . Arthritis   . Asthma    as a child  . Coronary artery disease    One-vessel; s/p LIMA-LAD, [facility] 2007  . GERD (gastroesophageal reflux disease)   . Hx of pleural effusion 2007   after CABG  . Hyperlipidemia    Unable to tolerate simvastatin 40 due to myalgias  . Hypertension     Patient Active Problem List   Diagnosis Date Noted  . Mood disorder ([place]) 03/19/2019  . Left sided sciatica 08/07/2017  . Coronary artery disease   . Osteoarthritis cervical spine 11/24/2016  . Chronic venous insufficiency 10/27/2015  . Advance directive discussed with patient 09/25/2014  . Allergic rhinitis due to pollen   . Routine general medical examination at a health care facility 09/27/2011  . Osteopenia 09/26/2010  . Essential hypertension, benign 05/27/2008  . GERD 09/28/2006    Past Surgical History:  Procedure Laterality Date  . CESAREAN SECTION    . CHOLECYSTECTOMY    . COLONOSCOPY    . CORONARY ARTERY BYPASS GRAFT  5/07   Readmitted for post op pleural effusions  . DOPPLER ECHOCARDIOGRAPHY     NV LV EF 1+ AI/MR  . EXCISION METACARPAL MASS Left 02/21/2018   Procedure:  EXCISION METACARPAL MASS;  Surgeon: Hessie Knows, MD;  Location: [facility] ORS;  Service: Orthopedics;  Laterality: Left;  . Stress imaging  6/07   Negative EF 67%  . TONSILLECTOMY      OB History   No obstetric history on file.      Home Medications    Prior to Admission medications   Medication Sig Start Date End Date Taking? Authorizing Provider  aspirin EC 325 MG tablet Take 650 mg by mouth daily as needed for moderate pain.   Yes [provider]  Calcium Carb-Cholecalciferol (CALCIUM 500 +D) 500-400 MG-UNIT TABS Take 1 tablet by mouth daily.   Yes [provider]  Ferrous Sulfate (IRON PO) Take 35 mg by mouth 2 (two) times daily.    Yes [provider]  Glucosamine 500 MG CAPS Take 500 mg by mouth 2 (two) times daily.   Yes [provider]  losartan (COZAAR) 50 MG tablet Take 1 tablet (50 mg total) by mouth daily. 03/19/19  Yes Venia Carbon, MD  Multiple Vitamin (MULTIVITAMIN PO) Take by mouth.   Yes [provider]  Omega-3 Fatty Acids (FISH OIL) 1000 MG CAPS Take 1,000 mg by mouth 2 (two) times daily.   Yes [provider]  omeprazole (PRILOSEC) 20 MG capsule Take 1 capsule (20 mg total) by mouth daily. 03/19/19  Yes Venia Carbon, MD    Family History Family History  Problem Relation Age of Onset  . Heart attack Mother 47  . Heart attack Maternal Grandmother   . Cancer Neg Hx        Breast or colon  . Diabetes Neg Hx   . Hypertension Neg Hx   . Colon cancer Neg Hx   . Colon polyps Neg Hx   . Esophageal cancer Neg Hx   . Rectal cancer Neg Hx   . Stomach cancer Neg Hx     Social History Social History   Tobacco Use  . Smoking status: Former Smoker    Types: Cigarettes    Quit date: 06/26/1994    Years since quitting: 25.3  . Smokeless tobacco: Never Used  Substance Use Topics  . Alcohol use: Yes    Comment: one daily  . Drug use: No     Allergies   Lisinopril, Codeine, and Morphine   Review of  Systems Review of Systems  Constitutional: Negative for chills and fever.  HENT: Negative for ear pain and sore throat.   Eyes: Negative for pain and visual disturbance.  Respiratory: Negative for cough and shortness of breath.   Cardiovascular: Negative for chest pain and palpitations.  Gastrointestinal: Positive for abdominal pain, diarrhea, nausea and vomiting.  Genitourinary: Positive for dysuria and frequency. Negative for hematuria.  Musculoskeletal: Negative for arthralgias and back pain.  Skin: Negative for color change and rash.  Neurological: Negative for dizziness, seizures, syncope, facial asymmetry, weakness and numbness.  All other systems reviewed and are negative.    Physical Exam Triage Vital Signs ED Triage Vitals  Enc Vitals Group     BP 11/06/19 1355 (!) 201/96     Pulse Rate 11/06/19 1355 67     Resp 11/06/19 1355 18     Temp 11/06/19 1355 98.5 F (36.9 C)     Temp Source 11/06/19 1355 Oral     SpO2 11/06/19 1355 97 %     Weight 11/06/19 1356 198 lb 6.6 oz (90 kg)     Height 11/06/19 1356 5\' 4"  (1.626 m)     Head Circumference --      Peak Flow --      Pain Score 11/06/19 1354 7     Pain Loc --      Pain Edu? --      Excl. in [place]? --    No data found.  Updated Vital Signs BP (!) 206/96   Pulse 67   Temp 98.5 F (36.9 C) (Oral)   Resp 18   Ht 5\' 4"  (1.626 m)   Wt 198 lb 6.6 oz (90 kg)   SpO2 97%   BMI 34.06 kg/m   Visual Acuity Right Eye Distance:   Left Eye Distance:   Bilateral Distance:    Right Eye Near:   Left Eye Near:    Bilateral Near:     Physical Exam Vitals and nursing note reviewed.  Constitutional:      General: She is not in acute distress.    Appearance: She is well-developed. She is ill-appearing.  HENT:     Head: Normocephalic and atraumatic.     Mouth/Throat:     Mouth: Mucous membranes are moist.  Eyes:     Conjunctiva/sclera: Conjunctivae normal.  Cardiovascular:     Rate and Rhythm: Normal rate and regular  rhythm.     Heart sounds: No murmur.  Pulmonary:     Effort:  Pulmonary effort is normal. No respiratory distress.     Breath sounds: Normal breath sounds.  Abdominal:     General: Bowel sounds are normal.     Palpations: Abdomen is soft.     Tenderness: There is abdominal tenderness in the left lower quadrant. There is no right CVA tenderness, left CVA tenderness, guarding or rebound.  Musculoskeletal:     Cervical back: Neck supple.     Right lower leg: No edema.     Left lower leg: No edema.  Skin:    General: Skin is warm and dry.     Findings: No rash.  Neurological:     General: No focal deficit present.     Mental Status: She is alert and oriented to person, place, and time.     Sensory: No sensory deficit.     Motor: No weakness.  Psychiatric:        Mood and Affect: Mood normal.        Behavior: Behavior normal.      UC Treatments / Results  Labs (all labs ordered are listed, but only abnormal results are displayed) Labs Reviewed  POCT URINALYSIS DIP (MANUAL ENTRY) - Abnormal; Notable for the following components:      Result Value   Color, UA straw (*)    Clarity, UA cloudy (*)    Glucose, UA =100 (*)    Ketones, POC UA small (15) (*)    Spec Grav, UA >=1.030 (*)    Blood, UA large (*)    Protein Ur, POC =100 (*)    All other components within normal limits    EKG   Radiology No results found.  Procedures Procedures (including critical care time)  Medications Ordered in UC Medications  ondansetron (ZOFRAN-ODT) disintegrating tablet 4 mg (4 mg Oral Given 11/06/19 1421)    Initial Impression / Assessment and Plan / UC Course  I have reviewed the triage vital signs and the nursing notes.  Pertinent labs & imaging results that were available during my care of the patient were reviewed by me and considered in my medical decision making (see chart for details).   Hypertensive urgency.  Abdominal pain.  Nausea, vomiting, and diarrhea.  Zofran given  here.  Sending patient to ED for evaluation.  She declines EMS and is being transported by [facility] POV.     Final Clinical Impressions(s) / UC Diagnoses   Final diagnoses:  Hypertensive urgency  Left lower quadrant abdominal pain  Nausea, vomiting, and diarrhea     Discharge Instructions     Go to the Emergency Department for evaluation of your symptoms.    You were given 4 mg of Zofran here.        ED Prescriptions    None     PDMP not reviewed this encounter.   Sharion Balloon, NP 11/06/19 1435

## 2019-11-06 NOTE — ED Notes (Signed)
Pt trx to CT.  

## 2019-11-06 NOTE — ED Notes (Addendum)
Pt was seen at [facility]  for symptoms and referred to ED r/t HTN. Pt states she is compliant w/ HTN meds and takes them at night.

## 2019-11-06 NOTE — Discharge Instructions (Signed)
Go to the Emergency Department for evaluation of your symptoms.    You were given 4 mg of Zofran here.

## 2019-11-06 NOTE — ED Provider Notes (Signed)
[facility] Emergency Department Provider Note   ____________________________________________   I have reviewed the triage vital signs and the nursing notes.   HISTORY  Chief Complaint Abdominal Pain   History limited by: Not Limited   HPI Denise Henson is a 74 y.o. female who presents to the emergency department today because of concerns for left lower quadrant abdominal pain.  Patient states that the pain started this morning.  It was accompanied by nausea and vomiting.  She also notes some discomfort with urination.  She states that she has had infrequent UTIs.  The burning somewhat reminded her of her UTIs in the past although the pain did not.  She initially went to urgent care however she was sent to the emergency department today for further work-up for the abdominal pain as well as high blood pressure.  Patient states she does have history of high blood pressure although it is never this high. She denies any fevers.    Records reviewed. Per medical record review patient has a history of HLD, GERD.   Past Medical History:  Diagnosis Date  . Allergic rhinitis due to pollen   . Allergy   . Anemia   . Aortic insufficiency 10/08   Mild; echocardiogram showed EF 55-65% with mild AI  . Arthritis   . Asthma    as a child  . Coronary artery disease    One-vessel; s/p LIMA-LAD, [facility] 2007  . GERD (gastroesophageal reflux disease)   . Hx of pleural effusion 2007   after CABG  . Hyperlipidemia    Unable to tolerate simvastatin 40 due to myalgias  . Hypertension     Patient Active Problem List   Diagnosis Date Noted  . Mood disorder ([place]) 03/19/2019  . Left sided sciatica 08/07/2017  . Coronary artery disease   . Osteoarthritis cervical spine 11/24/2016  . Chronic venous insufficiency 10/27/2015  . Advance directive discussed with patient 09/25/2014  . Allergic rhinitis due to pollen   . Routine general medical examination at a health  care facility 09/27/2011  . Osteopenia 09/26/2010  . Essential hypertension, benign 05/27/2008  . GERD 09/28/2006    Past Surgical History:  Procedure Laterality Date  . CESAREAN SECTION    . CHOLECYSTECTOMY    . COLONOSCOPY    . CORONARY ARTERY BYPASS GRAFT  5/07   Readmitted for post op pleural effusions  . DOPPLER ECHOCARDIOGRAPHY     NV LV EF 1+ AI/MR  . EXCISION METACARPAL MASS Left 02/21/2018   Procedure: EXCISION METACARPAL MASS;  Surgeon: Hessie Knows, MD;  Location: [facility] ORS;  Service: Orthopedics;  Laterality: Left;  . Stress imaging  6/07   Negative EF 67%  . TONSILLECTOMY      Prior to Admission medications   Medication Sig Start Date End Date Taking? Authorizing Provider  aspirin EC 325 MG tablet Take 650 mg by mouth daily as needed for moderate pain.    [provider]  Calcium Carb-Cholecalciferol (CALCIUM 500 +D) 500-400 MG-UNIT TABS Take 1 tablet by mouth daily.    [provider]  Ferrous Sulfate (IRON PO) Take 35 mg by mouth 2 (two) times daily.     [provider]  Glucosamine 500 MG CAPS Take 500 mg by mouth 2 (two) times daily.    [provider]  losartan (COZAAR) 50 MG tablet Take 1 tablet (50 mg total) by mouth daily. 03/19/19   Venia Carbon, MD  Multiple Vitamin (MULTIVITAMIN PO) Take  by mouth.    [provider]  Omega-3 Fatty Acids (FISH OIL) 1000 MG CAPS Take 1,000 mg by mouth 2 (two) times daily.    [provider]  omeprazole (PRILOSEC) 20 MG capsule Take 1 capsule (20 mg total) by mouth daily. 03/19/19   Venia Carbon, MD    Allergies Lisinopril, Codeine, and Morphine  Family History  Problem Relation Age of Onset  . Heart attack Mother 57  . Heart attack Maternal Grandmother   . Cancer Neg Hx        Breast or colon  . Diabetes Neg Hx   . Hypertension Neg Hx   . Colon cancer Neg Hx   . Colon polyps Neg Hx   . Esophageal cancer Neg Hx   . Rectal cancer Neg Hx   . Stomach cancer  Neg Hx     Social History Social History   Tobacco Use  . Smoking status: Former Smoker    Types: Cigarettes    Quit date: 06/26/1994    Years since quitting: 25.3  . Smokeless tobacco: Never Used  Substance Use Topics  . Alcohol use: Yes    Comment: one daily  . Drug use: No    Review of Systems Constitutional: No fever/chills Eyes: No visual changes. ENT: No sore throat. Cardiovascular: Denies chest pain. Respiratory: Denies shortness of breath. Gastrointestinal: Positive for abdominal pain.  Genitourinary: Negative for dysuria. Musculoskeletal: Negative for back pain. Skin: Negative for rash. Neurological: Negative for headaches, focal weakness or numbness.  ____________________________________________   PHYSICAL EXAM:  VITAL SIGNS: ED Triage Vitals  Enc Vitals Group     BP 11/06/19 1452 (!) 195/82     Pulse Rate 11/06/19 1452 69     Resp 11/06/19 1452 16     Temp 11/06/19 1452 98.7 F (37.1 C)     Temp Source 11/06/19 1452 Oral     SpO2 11/06/19 1452 98 %     Weight 11/06/19 1453 198 lb 6.6 oz (90 kg)     Height 11/06/19 1453 5\' 4"  (1.626 m)     Head Circumference --      Peak Flow --      Pain Score 11/06/19 1453 6   Constitutional: Alert and oriented.  Eyes: Conjunctivae are normal.  ENT      Head: Normocephalic and atraumatic.      Nose: No congestion/rhinnorhea.      Mouth/Throat: Mucous membranes are moist.      Neck: No stridor. Hematological/Lymphatic/Immunilogical: No cervical lymphadenopathy. Cardiovascular: Normal rate, regular rhythm.  No murmurs, rubs, or gallops.  Respiratory: Normal respiratory effort without tachypnea nor retractions. Breath sounds are clear and equal bilaterally. No wheezes/rales/rhonchi. Gastrointestinal: Soft and non tender. No rebound. No guarding.  Genitourinary: Deferred Musculoskeletal: Normal range of motion in all extremities. No lower extremity edema. Neurologic:  Normal speech and language. No gross focal  neurologic deficits are appreciated.  Skin:  Skin is warm, dry and intact. No rash noted. Psychiatric: Mood and affect are normal. Speech and behavior are normal. Patient exhibits appropriate insight and judgment.  ____________________________________________    LABS (pertinent positives/negatives)  CBC wbc 9.4, hgb 13.7, plt 234 CMP na 138, k 4.4, glu 166, cr 1.16 UA hazy, moderate leukocytes, 11-20 wbc rare bacteria Lipase 25  ____________________________________________   EKG  I, Nance Pear, attending physician, personally viewed and interpreted this EKG  EKG Time: 1459 Rate: 65 Rhythm: normal sinus rhythm Axis: normal Intervals: qtc 401 QRS: narrow ST changes:  no st elevation Impression: normal ekg  ____________________________________________    RADIOLOGY  CT abd/pel Left sided kidney stone  ____________________________________________   PROCEDURES  Procedures  ____________________________________________   INITIAL IMPRESSION / ASSESSMENT AND PLAN / ED COURSE  Pertinent labs & imaging results that were available during my care of the patient were reviewed by me and considered in my medical decision making (see chart for details).   Patient presented to the emergency department today because of concerns for left lower quadrant pain.  Differential would be broad including diverticulitis, SBO, kidney stones, UTI amongst other etiologies.  Patient's blood work without concerning leukocytosis.  However given extent of pain CT scan was obtained.  This was consistent with a left-sided kidney stone.  No fever, leukocytosis to suggest infection.  Will send urine for culture.  Patient did feel better after medications here in the emergency department.  Patient is hypertensive but I do think this could be related to acute illness.  ____________________________________________   FINAL CLINICAL IMPRESSION(S) / ED DIAGNOSES  Final diagnoses:  Kidney stone      Note: This dictation was prepared with Dragon dictation. Any transcriptional errors that result from this process are unintentional     Nance Pear, MD 11/06/19 [id number]

## 2019-11-06 NOTE — ED Notes (Signed)
ED Provider at bedside. 

## 2019-11-06 NOTE — ED Notes (Signed)
Unhooked pt to use the bathroom.

## 2019-11-06 NOTE — Discharge Instructions (Addendum)
Please seek medical attention for any high fevers, chest pain, shortness of breath, change in behavior, persistent vomiting, bloody stool or any other new or concerning symptoms.  

## 2019-11-07 ENCOUNTER — Emergency Department: Payer: Medicare Other

## 2019-11-07 ENCOUNTER — Encounter: Payer: Self-pay | Admitting: Emergency Medicine

## 2019-11-07 ENCOUNTER — Observation Stay
Admission: EM | Admit: 2019-11-07 | Discharge: 2019-11-08 | Disposition: A | Discharge status: Home / Self Care | Payer: Medicare Other | Attending: Internal Medicine | Admitting: Internal Medicine

## 2019-11-07 ENCOUNTER — Other Ambulatory Visit: Payer: Self-pay

## 2019-11-07 ENCOUNTER — Emergency Department: Admission: EM | Admit: 2019-11-07 | Discharge: 2019-11-07 | Payer: Medicare Other | Source: Home / Self Care

## 2019-11-07 DIAGNOSIS — K760 Fatty (change of) liver, not elsewhere classified: Secondary | ICD-10-CM | POA: Insufficient documentation

## 2019-11-07 DIAGNOSIS — N200 Calculus of kidney: Secondary | ICD-10-CM

## 2019-11-07 DIAGNOSIS — E785 Hyperlipidemia, unspecified: Secondary | ICD-10-CM | POA: Insufficient documentation

## 2019-11-07 DIAGNOSIS — Z87891 Personal history of nicotine dependence: Secondary | ICD-10-CM | POA: Insufficient documentation

## 2019-11-07 DIAGNOSIS — E869 Volume depletion, unspecified: Secondary | ICD-10-CM

## 2019-11-07 DIAGNOSIS — R824 Acetonuria: Secondary | ICD-10-CM

## 2019-11-07 DIAGNOSIS — K219 Gastro-esophageal reflux disease without esophagitis: Secondary | ICD-10-CM | POA: Insufficient documentation

## 2019-11-07 DIAGNOSIS — N132 Hydronephrosis with renal and ureteral calculous obstruction: Secondary | ICD-10-CM | POA: Diagnosis not present

## 2019-11-07 DIAGNOSIS — I2510 Atherosclerotic heart disease of native coronary artery without angina pectoris: Secondary | ICD-10-CM | POA: Diagnosis present

## 2019-11-07 DIAGNOSIS — Z7982 Long term (current) use of aspirin: Secondary | ICD-10-CM | POA: Insufficient documentation

## 2019-11-07 DIAGNOSIS — K449 Diaphragmatic hernia without obstruction or gangrene: Secondary | ICD-10-CM | POA: Insufficient documentation

## 2019-11-07 DIAGNOSIS — Z20822 Contact with and (suspected) exposure to covid-19: Secondary | ICD-10-CM | POA: Insufficient documentation

## 2019-11-07 DIAGNOSIS — K5730 Diverticulosis of large intestine without perforation or abscess without bleeding: Secondary | ICD-10-CM | POA: Insufficient documentation

## 2019-11-07 DIAGNOSIS — I700 Atherosclerosis of aorta: Secondary | ICD-10-CM | POA: Insufficient documentation

## 2019-11-07 DIAGNOSIS — I160 Hypertensive urgency: Secondary | ICD-10-CM

## 2019-11-07 DIAGNOSIS — N12 Tubulo-interstitial nephritis, not specified as acute or chronic: Secondary | ICD-10-CM

## 2019-11-07 DIAGNOSIS — M1990 Unspecified osteoarthritis, unspecified site: Secondary | ICD-10-CM | POA: Insufficient documentation

## 2019-11-07 DIAGNOSIS — N10 Acute tubulo-interstitial nephritis: Secondary | ICD-10-CM

## 2019-11-07 DIAGNOSIS — Z951 Presence of aortocoronary bypass graft: Secondary | ICD-10-CM | POA: Insufficient documentation

## 2019-11-07 DIAGNOSIS — N179 Acute kidney failure, unspecified: Secondary | ICD-10-CM

## 2019-11-07 DIAGNOSIS — Z79899 Other long term (current) drug therapy: Secondary | ICD-10-CM | POA: Insufficient documentation

## 2019-11-07 DIAGNOSIS — N201 Calculus of ureter: Secondary | ICD-10-CM

## 2019-11-07 DIAGNOSIS — N136 Pyonephrosis: Secondary | ICD-10-CM | POA: Diagnosis not present

## 2019-11-07 DIAGNOSIS — I10 Essential (primary) hypertension: Secondary | ICD-10-CM

## 2019-11-07 DIAGNOSIS — N139 Obstructive and reflux uropathy, unspecified: Secondary | ICD-10-CM

## 2019-11-07 LAB — CBC
HCT: 39.8 % (ref 36.0–46.0)
Hemoglobin: 13.2 g/dL (ref 12.0–15.0)
MCH: 32.8 pg (ref 26.0–34.0)
MCHC: 33.2 g/dL (ref 30.0–36.0)
MCV: 98.8 fL (ref 80.0–100.0)
Platelets: 222 10*3/uL (ref 150–400)
RBC: 4.03 MIL/uL (ref 3.87–5.11)
RDW: 13.3 % (ref 11.5–15.5)
WBC: 15.2 10*3/uL — ABNORMAL HIGH (ref 4.0–10.5)
nRBC: 0 % (ref 0.0–0.2)

## 2019-11-07 LAB — COMPREHENSIVE METABOLIC PANEL
ALT: 50 U/L — ABNORMAL HIGH (ref 0–44)
AST: 44 U/L — ABNORMAL HIGH (ref 15–41)
Albumin: 4.2 g/dL (ref 3.5–5.0)
Alkaline Phosphatase: 67 U/L (ref 38–126)
Anion gap: 10 (ref 5–15)
BUN: 20 mg/dL (ref 8–23)
CO2: 24 mmol/L (ref 22–32)
Calcium: 9.9 mg/dL (ref 8.9–10.3)
Chloride: 99 mmol/L (ref 98–111)
Creatinine, Ser: 1.61 mg/dL — ABNORMAL HIGH (ref 0.44–1.00)
GFR calc Af Amer: 36 mL/min — ABNORMAL LOW (ref >60)
GFR calc non Af Amer: 31 mL/min — ABNORMAL LOW (ref >60)
Glucose, Bld: 115 mg/dL — ABNORMAL HIGH (ref 70–99)
Potassium: 3.8 mmol/L (ref 3.5–5.1)
Sodium: 133 mmol/L — ABNORMAL LOW (ref 135–145)
Total Bilirubin: 1.2 mg/dL (ref 0.3–1.2)
Total Protein: 7.6 g/dL (ref 6.5–8.1)

## 2019-11-07 LAB — URINALYSIS, COMPLETE (UACMP) WITH MICROSCOPIC
Bacteria, UA: NONE SEEN
Bilirubin Urine: NEGATIVE
Glucose, UA: NEGATIVE mg/dL
Ketones, ur: 20 mg/dL — AB
Leukocytes,Ua: NEGATIVE
Nitrite: NEGATIVE
Protein, ur: 30 mg/dL — AB
Specific Gravity, Urine: 1.031 — ABNORMAL HIGH (ref 1.005–1.030)
pH: 5 (ref 5.0–8.0)

## 2019-11-07 LAB — LIPASE, BLOOD: Lipase: 26 U/L (ref 11–51)

## 2019-11-07 NOTE — ED Notes (Signed)
Pt back from CT

## 2019-11-07 NOTE — ED Triage Notes (Signed)
Patient with complaint of left lower back and abdominal pain, fever and burring with urination that started this afternoon. Patient states that her temperature was 99 at home.

## 2019-11-07 NOTE — ED Notes (Signed)
Pt given warm blankets.

## 2019-11-08 ENCOUNTER — Observation Stay: Payer: Medicare Other

## 2019-11-08 DIAGNOSIS — N132 Hydronephrosis with renal and ureteral calculous obstruction: Secondary | ICD-10-CM

## 2019-11-08 DIAGNOSIS — N136 Pyonephrosis: Secondary | ICD-10-CM | POA: Diagnosis not present

## 2019-11-08 DIAGNOSIS — N200 Calculus of kidney: Secondary | ICD-10-CM

## 2019-11-08 DIAGNOSIS — I160 Hypertensive urgency: Secondary | ICD-10-CM

## 2019-11-08 DIAGNOSIS — N10 Acute tubulo-interstitial nephritis: Secondary | ICD-10-CM | POA: Diagnosis not present

## 2019-11-08 DIAGNOSIS — N179 Acute kidney failure, unspecified: Secondary | ICD-10-CM

## 2019-11-08 LAB — URINE CULTURE: Culture: <10000 — AB

## 2019-11-08 LAB — SARS CORONAVIRUS 2 BY RT PCR (HOSPITAL ORDER, PERFORMED IN ~~LOC~~ HOSPITAL LAB): SARS Coronavirus 2: NEGATIVE

## 2019-11-08 MED ORDER — KETOROLAC TROMETHAMINE 30 MG/ML IJ SOLN: 15.0000 mg | Freq: Four times a day (QID) | INTRAMUSCULAR | Status: DC | PRN

## 2019-11-08 MED ORDER — MORPHINE SULFATE (PF) 4 MG/ML IV SOLN
4.0000 mg | Freq: Once | INTRAVENOUS | Status: DC
  Filled 2019-11-08: qty 1

## 2019-11-08 MED ORDER — ONDANSETRON HCL 4 MG/2ML IJ SOLN
4.0000 mg | Freq: Four times a day (QID) | INTRAMUSCULAR | Status: DC | PRN
  Filled 2019-11-08: qty 2

## 2019-11-08 MED ORDER — SODIUM CHLORIDE 0.9 % IV BOLUS
1000.0000 mL | Freq: Once | INTRAVENOUS | Status: AC
  Administered 2019-11-08: 1000 mL via INTRAVENOUS

## 2019-11-08 MED ORDER — DROPERIDOL 2.5 MG/ML IJ SOLN
2.5000 mg | Freq: Once | INTRAMUSCULAR | Status: AC
  Administered 2019-11-08: 2.5 mg via INTRAVENOUS
  Filled 2019-11-08: qty 2

## 2019-11-08 MED ORDER — ONDANSETRON HCL 4 MG/2ML IJ SOLN
4.0000 mg | INTRAMUSCULAR | Status: DC
Start: 2019-11-08 — End: 2019-11-08
  Filled 2019-11-08: qty 2

## 2019-11-08 MED ORDER — LABETALOL HCL 5 MG/ML IV SOLN: 15.0000 mg | Freq: Once | INTRAVENOUS | Status: AC

## 2019-11-08 MED ORDER — PHENAZOPYRIDINE HCL 100 MG PO TABS
100.0000 mg | ORAL_TABLET | Freq: Three times a day (TID) | ORAL | Status: DC
  Administered 2019-11-08 (×2): 100 mg via ORAL
  Filled 2019-11-08 (×3): qty 1

## 2019-11-08 MED ORDER — SODIUM CHLORIDE 0.9 % IV SOLN: INTRAVENOUS | Status: DC

## 2019-11-08 MED ORDER — TAMSULOSIN HCL 0.4 MG PO CAPS: 0.4000 mg | ORAL_CAPSULE | Freq: Every day | ORAL | 0 refills | Status: DC

## 2019-11-08 MED ORDER — SODIUM CHLORIDE 0.9 % IV SOLN
1.0000 g | INTRAVENOUS | Status: AC
  Administered 2019-11-08: 1 g via INTRAVENOUS
  Filled 2019-11-08: qty 10

## 2019-11-08 MED ORDER — CEPHALEXIN 500 MG PO CAPS: 500.0000 mg | ORAL_CAPSULE | Freq: Three times a day (TID) | ORAL | 0 refills | Status: AC

## 2019-11-08 MED ORDER — ACETAMINOPHEN 325 MG PO TABS: 650.0000 mg | ORAL_TABLET | Freq: Four times a day (QID) | ORAL | Status: DC | PRN

## 2019-11-08 MED ORDER — ENOXAPARIN SODIUM 40 MG/0.4ML ~~LOC~~ SOLN: 40.0000 mg | SUBCUTANEOUS | Status: DC

## 2019-11-08 MED ORDER — ACETAMINOPHEN 650 MG RE SUPP: 650.0000 mg | Freq: Four times a day (QID) | RECTAL | Status: DC | PRN

## 2019-11-08 MED ORDER — TAMSULOSIN HCL 0.4 MG PO CAPS
0.4000 mg | ORAL_CAPSULE | Freq: Every day | ORAL | Status: DC
  Administered 2019-11-08: 0.4 mg via ORAL
  Filled 2019-11-08: qty 1

## 2019-11-08 MED ORDER — LABETALOL HCL 5 MG/ML IV SOLN
INTRAVENOUS | Status: AC
  Administered 2019-11-08: 15 mg via INTRAVENOUS
  Filled 2019-11-08: qty 4

## 2019-11-08 MED ORDER — SODIUM CHLORIDE 0.9 % IV SOLN: 1.0000 g | INTRAVENOUS | Status: DC

## 2019-11-08 MED ORDER — LABETALOL HCL 5 MG/ML IV SOLN
10.0000 mg | INTRAVENOUS | Status: DC | PRN
  Filled 2019-11-08: qty 4

## 2019-11-08 MED ORDER — ONDANSETRON HCL 4 MG PO TABS: 4.0000 mg | ORAL_TABLET | Freq: Four times a day (QID) | ORAL | Status: DC | PRN

## 2019-11-08 NOTE — ED Notes (Signed)
E-signature pad not functional. 

## 2019-11-08 NOTE — ED Notes (Signed)
Patient declined having a new IV placed due to pump beeping with movement of left arm. Patient is calm and cooperative. Room darkened per patient's request.

## 2019-11-08 NOTE — Discharge Instructions (Signed)

## 2019-11-08 NOTE — Consult Note (Signed)
Urology Consult  Requesting physician: Judd Gaudier, MD  Reason for consultation: Left ureteral calculus  Chief Complaint: Feeling better  History of Present Illness: Denise Henson is a 74 y.o. female who initially presented to the ED on 11/06/2019 complaining of a several hour history of left lower quadrant abdominal pain associated with nausea and vomiting.  She did have mild dysuria.  No identifiable precipitating, aggravating or alleviating factors.  She was afebrile and exam was unremarkable.  CT of the abdomen/pelvis with contrast was obtained which showed a 3 mm left UVJ calculus versus recently passed calculus in the bladder with moderate upstream hydronephrosis.  Urinalysis showed 11-20 WBCs/6-10 RBCs and no leukocytosis.  Urine culture had insignificant growth.  She was discharged on oral analgesics and antiemetics.  She returned to the ED late 5/14 with worsening left flank and left lower quadrant abdominal pain.  She was also complaining of increased urinary frequency, urgency with occasional urge incontinence and dysuria.  She had chills and subjective fever but no documented fever.  Nausea persisted without vomiting.  There were no identifiable precipitating, aggravating or alleviating factors to her pain.  She had developed an interim leukocytosis at 15.2 and UA showed 11-20 RBCs/21-50 WBCs; urine was nitrite negative.  She was afebrile and normotensive.  CT was repeated which showed persistent moderate left hydronephrosis/hydroureter to the UVJ with retained contrast from the contrast study on 5/13.  She was admitted for observation, IV antibiotics and consideration of ureteral stenting.  When I saw her this morning she was feeling much better and stated her pain had completely resolved approximately 6 hours ago along with her voiding symptoms.  She has been afebrile.   Past Medical History:  Diagnosis Date  . Allergic rhinitis due to pollen   . Allergy   . Anemia   .  Aortic insufficiency 10/08   Mild; echocardiogram showed EF 55-65% with mild AI  . Arthritis   . Asthma    as a child  . Coronary artery disease    One-vessel; s/p LIMA-LAD, [facility] 2007  . GERD (gastroesophageal reflux disease)   . Hx of pleural effusion 2007   after CABG  . Hyperlipidemia    Unable to tolerate simvastatin 40 due to myalgias  . Hypertension     Past Surgical History:  Procedure Laterality Date  . CESAREAN SECTION    . CHOLECYSTECTOMY    . COLONOSCOPY    . CORONARY ARTERY BYPASS GRAFT  5/07   Readmitted for post op pleural effusions  . DOPPLER ECHOCARDIOGRAPHY     NV LV EF 1+ AI/MR  . EXCISION METACARPAL MASS Left 02/21/2018   Procedure: EXCISION METACARPAL MASS;  Surgeon: Hessie Knows, MD;  Location: [facility] ORS;  Service: Orthopedics;  Laterality: Left;  . Stress imaging  6/07   Negative EF 67%  . TONSILLECTOMY      Home Medications:  Current Meds  Medication Sig  . aspirin EC 325 MG tablet Take 650 mg by mouth daily as needed for moderate pain.  . Calcium Carb-Cholecalciferol (CALCIUM 500 +D) 500-400 MG-UNIT TABS Take 1 tablet by mouth daily.  . Ferrous Sulfate (IRON PO) Take 35 mg by mouth 2 (two) times daily.   . Glucosamine 500 MG CAPS Take 500 mg by mouth 2 (two) times daily.  Marland Kitchen losartan (COZAAR) 50 MG tablet Take 1 tablet (50 mg total) by mouth daily.  . Multiple Vitamin (MULTIVITAMIN PO) Take by mouth.  . Omega-3 Fatty Acids (FISH  OIL) 1000 MG CAPS Take 1,000 mg by mouth 2 (two) times daily.  Marland Kitchen omeprazole (PRILOSEC) 20 MG capsule Take 1 capsule (20 mg total) by mouth daily.  . Turmeric (QC TUMERIC COMPLEX PO) Take 1 capsule by mouth 2 (two) times daily.    Allergies:  Allergies  Allergen Reactions  . Lisinopril     cough  . Codeine Anxiety    jittery   . Morphine Anxiety    jittery    Family History  Problem Relation Age of Onset  . Heart attack Mother 45  . Heart attack Maternal Grandmother   . Cancer Neg Hx        Breast or  colon  . Diabetes Neg Hx   . Hypertension Neg Hx   . Colon cancer Neg Hx   . Colon polyps Neg Hx   . Esophageal cancer Neg Hx   . Rectal cancer Neg Hx   . Stomach cancer Neg Hx     Social History:  reports that she quit smoking about 25 years ago. Her smoking use included cigarettes. She has never used smokeless tobacco. She reports current alcohol use. She reports that she does not use drugs.  ROS: A complete review of systems was performed.  All systems are negative except for pertinent findings as noted.  Physical Exam:  Vital signs in last 24 hours: Temp:  [98.3 F (36.8 C)] 98.3 F (36.8 C) (05/14 1913) Pulse Rate:  [66-81] 68 (05/15 1141) Resp:  [13-22] 16 (05/15 1141) BP: (159-202)/(77-93) 159/77 (05/15 1141) SpO2:  [94 %-98 %] 95 % (05/15 1141) Weight:  [88.5 kg] 88.5 kg (05/14 1914) Constitutional:  Alert and oriented, No acute distress HEENT: [place] AT, moist mucus membranes.  Trachea midline, no masses Cardiovascular: Regular rate and rhythm, no clubbing, cyanosis, or edema. Respiratory: Normal respiratory effort, lungs clear bilaterally Skin: No rashes, bruises or suspicious lesions Lymph: No cervical or inguinal adenopathy Neurologic: Grossly intact, no focal deficits, moving all 4 extremities Psychiatric: Normal mood and affect   Laboratory Data:  Recent Labs    11/06/19 1457 11/07/19 1920  WBC 9.4 15.2*  HGB 13.7 13.2  HCT 40.2 39.8   Recent Labs    11/06/19 1457 11/07/19 1920  NA 138 133*  K 4.4 3.8  CL 104 99  CO2 24 24  GLUCOSE 166* 115*  BUN 19 20  CREATININE 1.16* 1.61*  CALCIUM 9.5 9.9   No results for input(s): LABPT, INR in the last 72 hours. No results for input(s): LABURIN in the last 72 hours. Results for orders placed or performed during the hospital encounter of 11/07/19  SARS Coronavirus 2 by RT PCR (hospital order, performed in [facility] hospital lab) Nasopharyngeal Nasopharyngeal Swab     Status: None   Collection Time:  11/08/19  2:40 AM   Specimen: Nasopharyngeal Swab  Result Value Ref Range Status   SARS Coronavirus 2 NEGATIVE NEGATIVE Final    Comment: (NOTE) SARS-CoV-2 target nucleic acids are NOT DETECTED. The SARS-CoV-2 RNA is generally detectable in upper and lower respiratory specimens during the acute phase of infection. The lowest concentration of SARS-CoV-2 viral copies this assay can detect is 250 copies / mL. A negative result does not preclude SARS-CoV-2 infection and should not be used as the sole basis for treatment or other patient management decisions.  A negative result may occur with improper specimen collection / handling, submission of specimen other than nasopharyngeal swab, presence of viral mutation(s) within the areas targeted by  this assay, and inadequate number of viral copies (<250 copies / mL). A negative result must be combined with clinical observations, patient history, and epidemiological information. Fact Sheet for Patients:   StrictlyIdeas.no Fact Sheet for Healthcare Providers: BankingDealers.co.za This test is not yet approved or cleared  by the [place] FDA and has been authorized for detection and/or diagnosis of SARS-CoV-2 by FDA under an Emergency Use Authorization (EUA).  This EUA will remain in effect (meaning this test can be used) for the duration of the COVID-19 declaration under Section 564(b)(1) of the Act, 21 U.S.C. section 360bbb-3(b)(1), unless the authorization is terminated or revoked sooner. Performed at [facility], [address], [place], [place] [address]      Radiologic Imaging: CT scans of 5/13 and 5/15 were reviewed.  There was a contrast filling defect around the intramural portion of the left distal ureter consistent with edema  DG Abd 1 View  Result Date: 11/08/2019 CLINICAL DATA:  Follow-up renal calculus EXAM: ABDOMEN - 1 VIEW COMPARISON:  CT from the previous day.  FINDINGS: Scattered large and small bowel gas is noted. Changes of prior cholecystectomy are seen. The previously seen dense nephrogram and pyelogram seen on the previous CT has resolved. Vague calcification is noted just to the left of the midline which may represent the known left UVJ stone. Degenerative changes of lumbar spine are noted. IMPRESSION: Vague calcification to the left of the midline which may represent the known distal left ureteral stone. The previously seen dense nephrogram and pyelogram has improved in the interval from the recent exam. Electronically Signed   By: Inez Catalina M.D.   On: 11/08/2019 11:56   CT ABDOMEN PELVIS W CONTRAST  Result Date: 11/06/2019 CLINICAL DATA:  Left lower quadrant pain, emesis and diarrhea EXAM: CT ABDOMEN AND PELVIS WITH CONTRAST TECHNIQUE: Multidetector CT imaging of the abdomen and pelvis was performed using the standard protocol following bolus administration of intravenous contrast. CONTRAST:  127mL OMNIPAQUE IOHEXOL 300 MG/ML  SOLN COMPARISON:  None FINDINGS: Lower chest: Basilar atelectatic changes. Additional atelectasis in the left cardiophrenic sulcus adjacent a large hiatal hernia. No consolidation or pleural effusion. Mild cardiomegaly with coronary artery atherosclerosis. No pericardial effusion. Hepatobiliary: Diffuse hepatic hypoattenuation compatible with hepatic steatosis. Scattered fluid attenuation cysts seen throughout the right and left lobes liver largest in the right is a bilobed cyst measuring 2.6 cm (2/29) and largest in the left measures up to 1.4 cm (2/15). No concerning focal liver lesion. Diffuse hepatic hypoattenuation. Patient is post cholecystectomy with slight prominence of the extrahepatic biliary tree likely related to senescent change and reservoir effect. No calcified intraductal gallstones or frank biliary dilatation. Pancreas: Unremarkable. No pancreatic ductal dilatation or surrounding inflammatory changes. Spleen: Normal  in size without focal abnormality. Adrenals/Urinary Tract: Normal adrenal glands. Slight delay in the left renal nephrogram and excretion. Right kidney normally enhances and excretes. Asymmetrically increased left perinephric stranding with at least moderate hydroureteronephrosis to the level of the urinary bladder where a 3 mm calculus is seen in the vicinity of the left ureterovesicular junction, possibly within the UVJ orifice or recently passed into the urinary bladder. Trace amount of fluid in the perinephric and pararenal spaces could suggest a small calyceal rupture. Multiple parapelvic cysts are seen bilaterally as well. No concerning renal masses. Punctate nonobstructing calculus seen in the upper pole right kidney (5/53) no obstructive right urolithiasis or hydronephrosis. Urinary bladder is largely decompressed at the time of exam and therefore poorly evaluated by  CT imaging. No other gross bladder abnormality is seen. Stomach/Bowel: Large hiatal hernia, as above with slight organo-axial rotation of the herniated gastric segment. No convincing evidence of vascular compromise at this time. Passage of ingested material beyond this level argues against mechanical obstruction. Distal stomach and duodenal sweep are unremarkable. Some fecalization of the distal small bowel contents are present without evidence of mechanical obstruction elsewhere in the bowel. Moderate colonic stool burden. Numerous pancolonic diverticulosis without focal inflammation to suggest acute diverticulitis. Vascular/Lymphatic: Atherosclerotic calcification of the abdominal aorta and branch vessels. No aneurysm or ectasia. Major venous structures are unremarkable. No enlarged abdominopelvic nodes. Reactive adenopathy in the retroperitoneum. Reproductive: Normal appearance of the uterus and adnexal structures. Other: Stranding and trace fluid in the retroperitoneum, as detailed above. No abdominopelvic free free fluid or air seen  elsewhere. Mild body wall edema. Posterior injection granulomata Musculoskeletal: Multilevel degenerative changes are present in the imaged portions of the spine. No acute osseous abnormality or suspicious osseous lesion. IMPRESSION: 1. 3 mm calculus in the vicinity of the left ureterovesicular junction, possibly within the UVJ orifice or recently passed into the urinary bladder. Upstream moderate left hydroureteronephrosis as well as physiologic delay of the nephrogram and excretion with trace amount of fluid in the perinephric and pararenal spaces possibly reactive though could also suggest a small calyceal rupture. 2. Moderate colonic stool burden with fecalization of the distal small bowel contents without evidence of mechanical obstruction elsewhere in the bowel. Findings could reflect slowed intestinal transit/constipation. 3. Hepatic steatosis. 4. Colonic diverticulosis without evidence of acute diverticulitis. 5. Large hiatal hernia with slight organo-axial rotation of the herniated gastric segment. No convincing evidence of vascular compromise or obstruction at this time. 6. Additional punctate nonobstructing calculus in the upper pole right kidney. 7. Aortic Atherosclerosis (ICD10-I70.0). Electronically Signed   By: Lovena Le M.D.   On: 11/06/2019 21:38   CT Renal Stone Study  Result Date: 11/08/2019 CLINICAL DATA:  Urinary tract stone EXAM: CT ABDOMEN AND PELVIS WITHOUT CONTRAST TECHNIQUE: Multidetector CT imaging of the abdomen and pelvis was performed following the standard protocol without IV contrast. COMPARISON:  May 13 and 9:11 p.m. FINDINGS: Lower chest: The visualized heart size within normal limits. No pericardial fluid/thickening. There is a large hiatal hernia present. The visualized portions of the lungs are clear. Hepatobiliary: Again noted is a ill-defined low-density lesions within the liver parenchyma. The largest measuring 2 cm in the posterior right liver lobe. There is diffuse  low density seen throughout the liver parenchyma. The patient is status post cholecystectomy. No biliary ductal dilation. Pancreas:  Unremarkable.  No surrounding inflammatory changes. Spleen: Normal in size. Although limited due to the lack of intravenous contrast, normal in appearance. Adrenals/Urinary Tract: Both adrenal glands appear normal. Again seen is moderate left-sided hydronephrosis with delayed contrast excretion from the prior exam down to the level of the UVJ. Again noted is significant surrounding perinephric and periureteral stranding which extends down the retroperitoneum and not significantly changed since the prior exam. Again noted is a tiny 3 mm calculus in the posterior right bladder. There is contrast seen filling the bladder. Again noted is a right-sided parapelvic cysts with tiny punctate renal calculi in the upper pole. Stomach/Bowel: The stomach, small bowel, and colon are normal in appearance. No inflammatory changes or obstructive findings. Scattered colonic diverticulosis is seen without diverticulitis. Vascular/Lymphatic: There are no enlarged abdominal or pelvic lymph nodes. No significant gross vascular findings are present. Reproductive: The uterus and adnexa are unremarkable.  Other: No evidence of abdominal wall mass or hernia. Musculoskeletal: No acute or significant osseous findings. IMPRESSION: Again noted is moderate left hydronephrosis with perinephric and periureteral stranding down to the level of the posterior left bladder there is a 3 mm probable recently passed calculus. This is not significantly changed from the prior exam. There is delayed contrast excretion throughout the left collecting system from the prior CT. Diverticulosis without diverticulitis. Punctate nonobstructing right renal calculus. Aortic Atherosclerosis (ICD10-I70.0). Electronically Signed   By: Prudencio Pair M.D.   On: 11/08/2019 00:04    Impression/Assessment:   1.  Left distal ureteral  calculus CT performed earlier this morning did show high-grade left ureteral obstruction however she has been pain-free for 6 hours and voiding symptoms have resolved.  I obtained a KUB this morning and no retained contrast is seen.  I do not see any calcifications suspicious for a ureteral calculus in the left true bony pelvis and she has most likely passed her stone.  Recommendation:  -No need for urgent stenting -Patient may have a diet -Urine culture from 5/13 had no significant growth -If she remains asymptomatic okay for discharge later this afternoon versus keeping overnight for further observation -If she remains asymptomatic and his discharged will follow her up in the office with a repeat renal ultrasound   11/08/2019, 12:30 PM  John Giovanni,  MD

## 2019-11-08 NOTE — ED Notes (Signed)
This RN notified admitting provider that patient is requesting droperidol for pain. Waiting for response at this time.

## 2019-11-08 NOTE — H&P (Signed)
History and Physical    Denise Henson [id number] DOB: [birth year] DOA: 11/07/2019  PCP: Venia Carbon, MD   Patient coming from: Home I have personally briefly reviewed patient's old medical records in [place]  Chief Complaint: Left-sided flank pain  HPI: Denise Henson is a 74 y.o. female with medical history significant for hypertension and CAD with history of CABG who presents to the emergency room for the second time in 24 hours with left-sided flank pain.  On her first visit to the emergency room she was found to have a 3 mm obstructing ureteral calculus.  She was treated and discharged.  Today she returns with a complaint of worsening of the pain, subjective fever and chills and had an episode of vomiting.  She describes the pain as severe, cramping, and the left lower abdomen radiating to the left mid back.  She has not passed the stone.  ED Course: On arrival, she was afebrile, with elevated blood pressure as high as 202/93.  She was not tachycardic.  Blood work significant for WBC of 15,000 and a creatinine of 1.61 above her baseline of 0.83.  CT renal stone protocol showed moderate left hydronephrosis with perinephric and periureteral stranding down to the level of the posterior left bladder where there is a 3 mm probably recently passed calculus, not significantly changed from the prior exam.  There was delayed contrast excretion.  The ER provider spoke with urologist, Dr. Bernardo Heater who will see patient in the a.m.  Hospitalist consulted for admission.  Review of Systems: As per HPI otherwise 10 point review of systems negative.    Past Medical History:  Diagnosis Date  . Allergic rhinitis due to pollen   . Allergy   . Anemia   . Aortic insufficiency 10/08   Mild; echocardiogram showed EF 55-65% with mild AI  . Arthritis   . Asthma    as a child  . Coronary artery disease    One-vessel; s/p LIMA-LAD, [facility] 2007  . GERD (gastroesophageal reflux  disease)   . Hx of pleural effusion 2007   after CABG  . Hyperlipidemia    Unable to tolerate simvastatin 40 due to myalgias  . Hypertension     Past Surgical History:  Procedure Laterality Date  . CESAREAN SECTION    . CHOLECYSTECTOMY    . COLONOSCOPY    . CORONARY ARTERY BYPASS GRAFT  5/07   Readmitted for post op pleural effusions  . DOPPLER ECHOCARDIOGRAPHY     NV LV EF 1+ AI/MR  . EXCISION METACARPAL MASS Left 02/21/2018   Procedure: EXCISION METACARPAL MASS;  Surgeon: Hessie Knows, MD;  Location: [facility] ORS;  Service: Orthopedics;  Laterality: Left;  . Stress imaging  6/07   Negative EF 67%  . TONSILLECTOMY       reports that she quit smoking about 25 years ago. Her smoking use included cigarettes. She has never used smokeless tobacco. She reports current alcohol use. She reports that she does not use drugs.  Allergies  Allergen Reactions  . Lisinopril     cough  . Codeine Anxiety    jittery   . Morphine Anxiety    jittery    Family History  Problem Relation Age of Onset  . Heart attack Mother 38  . Heart attack Maternal Grandmother   . Cancer Neg Hx        Breast or colon  . Diabetes Neg Hx   . Hypertension Neg Hx   .  Colon cancer Neg Hx   . Colon polyps Neg Hx   . Esophageal cancer Neg Hx   . Rectal cancer Neg Hx   . Stomach cancer Neg Hx      Prior to Admission medications   Medication Sig Start Date End Date Taking? Authorizing Provider  aspirin EC 325 MG tablet Take 650 mg by mouth daily as needed for moderate pain.    [provider]  Calcium Carb-Cholecalciferol (CALCIUM 500 +D) 500-400 MG-UNIT TABS Take 1 tablet by mouth daily.    [provider]  Ferrous Sulfate (IRON PO) Take 35 mg by mouth 2 (two) times daily.     [provider]  Glucosamine 500 MG CAPS Take 500 mg by mouth 2 (two) times daily.    [provider]  losartan (COZAAR) 50 MG tablet Take 1 tablet (50 mg total) by mouth daily. 03/19/19   Venia Carbon, MD  Multiple Vitamin (MULTIVITAMIN PO) Take by mouth.    [provider]  Omega-3 Fatty Acids (FISH OIL) 1000 MG CAPS Take 1,000 mg by mouth 2 (two) times daily.    [provider]  omeprazole (PRILOSEC) 20 MG capsule Take 1 capsule (20 mg total) by mouth daily. 03/19/19   Venia Carbon, MD    Physical Exam: Vitals:   11/07/19 1913 11/07/19 1914 11/08/19 0106  BP: (!) 198/84  (!) 202/93  Pulse: 81  81  Resp: 18    Temp: 98.3 F (36.8 C)    TempSrc: Oral    SpO2: 98%  97%  Weight:  88.5 kg   Height:  5\' 4"  (1.626 m)      Vitals:   11/07/19 1913 11/07/19 1914 11/08/19 0106  BP: (!) 198/84  (!) 202/93  Pulse: 81  81  Resp: 18    Temp: 98.3 F (36.8 C)    TempSrc: Oral    SpO2: 98%  97%  Weight:  88.5 kg   Height:  5\' 4"  (1.626 m)     Constitutional: Alert and awake, oriented x3, not in any acute distress. Eyes: PERLA, EOMI, irises appear normal, anicteric sclera,  ENMT: external ears and nose appear normal, normal hearing             Lips appears normal, oropharynx mucosa, tongue, posterior pharynx appear normal  Neck: neck appears normal, no masses, normal ROM, no thyromegaly, no JVD  CVS: S1-S2 clear, no murmur rubs or gallops,  , no carotid bruits, pedal pulses palpable, No LE edema Respiratory:  clear to auscultation bilaterally, no wheezing, rales or rhonchi. Respiratory effort normal. No accessory muscle use.  Abdomen: Tender in left flank and mid left costovertebral tenderness, nondistended, normal bowel sounds, no hepatosplenomegaly, no hernias Musculoskeletal: : no cyanosis, clubbing , no contractures or atrophy Neuro: Cranial nerves II-XII intact, sensation, reflexes normal, strength Psych: judgement and insight appear normal, stable mood and affect,  Skin: no rashes or lesions or ulcers, no induration or nodules   Labs on Admission: I have personally reviewed following labs and imaging studies  CBC: Recent Labs  Lab  11/06/19 1457 11/07/19 1920  WBC 9.4 15.2*  HGB 13.7 13.2  HCT 40.2 39.8  MCV 96.9 98.8  PLT 234 [id number]   Basic Metabolic Panel: Recent Labs  Lab 11/06/19 1457 11/07/19 1920  NA 138 133*  K 4.4 3.8  CL 104 99  CO2 24 24  GLUCOSE 166* 115*  BUN 19 20  CREATININE 1.16* 1.61*  CALCIUM 9.5 9.9  GFR: Estimated Creatinine Clearance: 33 mL/min (A) (by C-G formula based on SCr of 1.61 mg/dL (H)). Liver Function Tests: Recent Labs  Lab 11/06/19 1457 11/07/19 1920  AST 40 44*  ALT 51* 50*  ALKPHOS 69 67  BILITOT 0.7 1.2  PROT 7.2 7.6  ALBUMIN 4.0 4.2   Recent Labs  Lab 11/06/19 1457 11/07/19 1920  LIPASE 25 26   No results for input(s): AMMONIA in the last 168 hours. Coagulation Profile: No results for input(s): INR, PROTIME in the last 168 hours. Cardiac Enzymes: No results for input(s): CKTOTAL, CKMB, CKMBINDEX, TROPONINI in the last 168 hours. BNP (last 3 results) No results for input(s): PROBNP in the last 8760 hours. HbA1C: No results for input(s): HGBA1C in the last 72 hours. CBG: No results for input(s): GLUCAP in the last 168 hours. Lipid Profile: No results for input(s): CHOL, HDL, LDLCALC, TRIG, CHOLHDL, LDLDIRECT in the last 72 hours. Thyroid Function Tests: No results for input(s): TSH, T4TOTAL, FREET4, T3FREE, THYROIDAB in the last 72 hours. Anemia Panel: No results for input(s): VITAMINB12, FOLATE, FERRITIN, TIBC, IRON, RETICCTPCT in the last 72 hours. Urine analysis:    Component Value Date/Time   COLORURINE YELLOW (A) 11/07/2019 1920   APPEARANCEUR CLOUDY (A) 11/07/2019 1920   LABSPEC 1.031 (H) 11/07/2019 1920   PHURINE 5.0 11/07/2019 1920   GLUCOSEU NEGATIVE 11/07/2019 1920   HGBUR SMALL (A) 11/07/2019 1920   BILIRUBINUR NEGATIVE 11/07/2019 1920   BILIRUBINUR negative 11/06/2019 1407   BILIRUBINUR 1+ 09/10/2018 1135   KETONESUR 20 (A) 11/07/2019 1920   PROTEINUR 30 (A) 11/07/2019 1920   UROBILINOGEN 0.2 11/06/2019 1407   NITRITE NEGATIVE  11/07/2019 1920   LEUKOCYTESUR NEGATIVE 11/07/2019 1920    Radiological Exams on Admission: CT ABDOMEN PELVIS W CONTRAST  Result Date: 11/06/2019 CLINICAL DATA:  Left lower quadrant pain, emesis and diarrhea EXAM: CT ABDOMEN AND PELVIS WITH CONTRAST TECHNIQUE: Multidetector CT imaging of the abdomen and pelvis was performed using the standard protocol following bolus administration of intravenous contrast. CONTRAST:  16mL OMNIPAQUE IOHEXOL 300 MG/ML  SOLN COMPARISON:  None FINDINGS: Lower chest: Basilar atelectatic changes. Additional atelectasis in the left cardiophrenic sulcus adjacent a large hiatal hernia. No consolidation or pleural effusion. Mild cardiomegaly with coronary artery atherosclerosis. No pericardial effusion. Hepatobiliary: Diffuse hepatic hypoattenuation compatible with hepatic steatosis. Scattered fluid attenuation cysts seen throughout the right and left lobes liver largest in the right is a bilobed cyst measuring 2.6 cm (2/29) and largest in the left measures up to 1.4 cm (2/15). No concerning focal liver lesion. Diffuse hepatic hypoattenuation. Patient is post cholecystectomy with slight prominence of the extrahepatic biliary tree likely related to senescent change and reservoir effect. No calcified intraductal gallstones or frank biliary dilatation. Pancreas: Unremarkable. No pancreatic ductal dilatation or surrounding inflammatory changes. Spleen: Normal in size without focal abnormality. Adrenals/Urinary Tract: Normal adrenal glands. Slight delay in the left renal nephrogram and excretion. Right kidney normally enhances and excretes. Asymmetrically increased left perinephric stranding with at least moderate hydroureteronephrosis to the level of the urinary bladder where a 3 mm calculus is seen in the vicinity of the left ureterovesicular junction, possibly within the UVJ orifice or recently passed into the urinary bladder. Trace amount of fluid in the perinephric and pararenal  spaces could suggest a small calyceal rupture. Multiple parapelvic cysts are seen bilaterally as well. No concerning renal masses. Punctate nonobstructing calculus seen in the upper pole right kidney (5/53) no obstructive right urolithiasis or hydronephrosis. Urinary bladder is largely  decompressed at the time of exam and therefore poorly evaluated by CT imaging. No other gross bladder abnormality is seen. Stomach/Bowel: Large hiatal hernia, as above with slight organo-axial rotation of the herniated gastric segment. No convincing evidence of vascular compromise at this time. Passage of ingested material beyond this level argues against mechanical obstruction. Distal stomach and duodenal sweep are unremarkable. Some fecalization of the distal small bowel contents are present without evidence of mechanical obstruction elsewhere in the bowel. Moderate colonic stool burden. Numerous pancolonic diverticulosis without focal inflammation to suggest acute diverticulitis. Vascular/Lymphatic: Atherosclerotic calcification of the abdominal aorta and branch vessels. No aneurysm or ectasia. Major venous structures are unremarkable. No enlarged abdominopelvic nodes. Reactive adenopathy in the retroperitoneum. Reproductive: Normal appearance of the uterus and adnexal structures. Other: Stranding and trace fluid in the retroperitoneum, as detailed above. No abdominopelvic free free fluid or air seen elsewhere. Mild body wall edema. Posterior injection granulomata Musculoskeletal: Multilevel degenerative changes are present in the imaged portions of the spine. No acute osseous abnormality or suspicious osseous lesion. IMPRESSION: 1. 3 mm calculus in the vicinity of the left ureterovesicular junction, possibly within the UVJ orifice or recently passed into the urinary bladder. Upstream moderate left hydroureteronephrosis as well as physiologic delay of the nephrogram and excretion with trace amount of fluid in the perinephric and  pararenal spaces possibly reactive though could also suggest a small calyceal rupture. 2. Moderate colonic stool burden with fecalization of the distal small bowel contents without evidence of mechanical obstruction elsewhere in the bowel. Findings could reflect slowed intestinal transit/constipation. 3. Hepatic steatosis. 4. Colonic diverticulosis without evidence of acute diverticulitis. 5. Large hiatal hernia with slight organo-axial rotation of the herniated gastric segment. No convincing evidence of vascular compromise or obstruction at this time. 6. Additional punctate nonobstructing calculus in the upper pole right kidney. 7. Aortic Atherosclerosis (ICD10-I70.0). Electronically Signed   By: Lovena Le M.D.   On: 11/06/2019 21:38   CT Renal Stone Study  Result Date: 11/08/2019 CLINICAL DATA:  Urinary tract stone EXAM: CT ABDOMEN AND PELVIS WITHOUT CONTRAST TECHNIQUE: Multidetector CT imaging of the abdomen and pelvis was performed following the standard protocol without IV contrast. COMPARISON:  May 13 and 9:11 p.m. FINDINGS: Lower chest: The visualized heart size within normal limits. No pericardial fluid/thickening. There is a large hiatal hernia present. The visualized portions of the lungs are clear. Hepatobiliary: Again noted is a ill-defined low-density lesions within the liver parenchyma. The largest measuring 2 cm in the posterior right liver lobe. There is diffuse low density seen throughout the liver parenchyma. The patient is status post cholecystectomy. No biliary ductal dilation. Pancreas:  Unremarkable.  No surrounding inflammatory changes. Spleen: Normal in size. Although limited due to the lack of intravenous contrast, normal in appearance. Adrenals/Urinary Tract: Both adrenal glands appear normal. Again seen is moderate left-sided hydronephrosis with delayed contrast excretion from the prior exam down to the level of the UVJ. Again noted is significant surrounding perinephric and  periureteral stranding which extends down the retroperitoneum and not significantly changed since the prior exam. Again noted is a tiny 3 mm calculus in the posterior right bladder. There is contrast seen filling the bladder. Again noted is a right-sided parapelvic cysts with tiny punctate renal calculi in the upper pole. Stomach/Bowel: The stomach, small bowel, and colon are normal in appearance. No inflammatory changes or obstructive findings. Scattered colonic diverticulosis is seen without diverticulitis. Vascular/Lymphatic: There are no enlarged abdominal or pelvic lymph nodes. No significant gross  vascular findings are present. Reproductive: The uterus and adnexa are unremarkable. Other: No evidence of abdominal wall mass or hernia. Musculoskeletal: No acute or significant osseous findings. IMPRESSION: Again noted is moderate left hydronephrosis with perinephric and periureteral stranding down to the level of the posterior left bladder there is a 3 mm probable recently passed calculus. This is not significantly changed from the prior exam. There is delayed contrast excretion throughout the left collecting system from the prior CT. Diverticulosis without diverticulitis. Punctate nonobstructing right renal calculus. Aortic Atherosclerosis (ICD10-I70.0). Electronically Signed   By: Prudencio Pair M.D.   On: 11/08/2019 00:04    EKG: Independently reviewed.   Assessment/Plan Principal Problem:   Acute pyelonephritis, suspect secondary to obstructing ureteral calculus   Hydronephrosis, left   Nephrolithiasis -Patient presented with left flank pain, subjective fever and vomiting with CT abdomen showing left hydronephrosis with 3 mm stone in posterior bladder.  Patient had presented to the emergency room the day prior with renal colic at which time stone was not distal ureter -IV Rocephin, IV hydration, IV Toradol.  Patient does not want narcotic pain meds due to adverse effect of severe anxiety in the  past -Consulted urology, Dr. Bernardo Heater -We will keep n.p.o. for possible procedure/stent placement in the a.m.    AKI (acute kidney injury) ([place]) -Creatinine 1.61, up from baseline of 0.83. -Suspect related to obstructive uropathy in combination with dehydration from decreased oral intake -IV hydration -Monitor renal function and avoid nephrotoxins    Hypertensive urgency -Blood pressure 202/93 in the emergency room, likely related to pain -Labetalol 10 mg every 2 as needed systolic over [id number] while n.p.o.    Coronary artery disease with history of CABG -No complaints of chest pain -Currently on aspirin.  No beta-blocker or statin seen on med list, pending med rec    DVT prophylaxis: We will hold Lovenox for possible procedure.  SCDs instead Code Status: full code  Family Communication:  none  Disposition Plan: Back to previous home environment Consults called: Dr. Bernardo Heater Status:obs    Athena Masse MD [facility]     11/08/2019, 1:36 AM

## 2019-11-08 NOTE — Discharge Summary (Signed)
[facility] [place] NAME: Denise Henson    MR#:  [id number]  DATE OF BIRTH:  December 18, 1945  DATE OF ADMISSION:  11/07/2019 ADMITTING PHYSICIAN: Athena Masse, MD  DATE OF DISCHARGE: 11/08/2019  PRIMARY CARE PHYSICIAN: Venia Carbon, MD    ADMISSION DIAGNOSIS:  Acute pyelonephritis [N10]  DISCHARGE DIAGNOSIS:  Principal Problem:   Acute pyelonephritis Active Problems:   Coronary artery disease with history of CABG   Hypertensive urgency   Hydronephrosis, left   Nephrolithiasis   AKI (acute kidney injury) ([place])   SECONDARY DIAGNOSIS:   Past Medical History:  Diagnosis Date  . Allergic rhinitis due to pollen   . Allergy   . Anemia   . Aortic insufficiency 10/08   Mild; echocardiogram showed EF 55-65% with mild AI  . Arthritis   . Asthma    as a child  . Coronary artery disease    One-vessel; s/p LIMA-LAD, [facility] 2007  . GERD (gastroesophageal reflux disease)   . Hx of pleural effusion 2007   after CABG  . Hyperlipidemia    Unable to tolerate simvastatin 40 due to myalgias  . Hypertension     HOSPITAL COURSE:   1.  Acute pyelonephritis.  Patient received Rocephin here in the emergency room.  We will give 6 more days of Keflex upon going home and follow-up urine culture. 2.  Left distal ureteral calculus with left hydronephrosis.  Case discussed with Dr. Bernardo Heater urology and on x-ray looks like the stone has passed and patient not having any pain.  We will continue Flomax for now.  Dr. Bernardo Heater said if the patient feels better can go home this afternoon and the patient would like to go home.  She will seek medical care if she has any further issues. 3.  Acute kidney injury.  Likely from obstructing stone that seem to have passed.  This should improve.  Follow-up as outpatient recheck creatinine as outpatient.  Hold losartan for now. 4.  Hypertensive urgency blood pressure very elevated in the emergency room likely  secondary to pain.  Last blood pressure reading 159/77.   DISCHARGE CONDITIONS:   Satisfactory  CONSULTS OBTAINED:  Urology  DRUG ALLERGIES:   Allergies  Allergen Reactions  . Lisinopril     cough  . Codeine Anxiety    jittery   . Morphine Anxiety    jittery    DISCHARGE MEDICATIONS:   Allergies as of 11/08/2019      Reactions   Lisinopril    cough   Codeine Anxiety   jittery    Morphine Anxiety   jittery      Medication List    STOP taking these medications   losartan 50 MG tablet Commonly known as: COZAAR     TAKE these medications   aspirin EC 325 MG tablet Take 650 mg by mouth daily as needed for moderate pain.   Calcium 500 +D 500-400 MG-UNIT Tabs Generic drug: Calcium Carb-Cholecalciferol Take 1 tablet by mouth daily.   cephALEXin 500 MG capsule Commonly known as: KEFLEX Take 1 capsule (500 mg total) by mouth 3 (three) times daily for 6 days.   Fish Oil 1000 MG Caps Take 1,000 mg by mouth 2 (two) times daily.   Glucosamine 500 MG Caps Take 500 mg by mouth 2 (two) times daily.   IRON PO Take 35 mg by mouth 2 (two) times daily.   MULTIVITAMIN PO Take by mouth.   omeprazole 20  MG capsule Commonly known as: PRILOSEC Take 1 capsule (20 mg total) by mouth daily.   QC TUMERIC COMPLEX PO Take 1 capsule by mouth 2 (two) times daily.   tamsulosin 0.4 MG Caps capsule Commonly known as: FLOMAX Take 1 capsule (0.4 mg total) by mouth daily. Start taking on: Nov 09, 2019        DISCHARGE INSTRUCTIONS:   Follow-up PMD 5 days Follow-up urology if needed in few weeks  If you experience worsening of your admission symptoms, develop shortness of breath, life threatening emergency, suicidal or homicidal thoughts you must seek medical attention immediately by calling 911 or calling your MD immediately  if symptoms less severe.  You Must read complete instructions/literature along with all the possible adverse reactions/side effects for all the  Medicines you take and that have been prescribed to you. Take any new Medicines after you have completely understood and accept all the possible adverse reactions/side effects.   Please note  You were cared for by a hospitalist during your hospital stay. If you have any questions about your discharge medications or the care you received while you were in the hospital after you are discharged, you can call the unit and asked to speak with the hospitalist on call if the hospitalist that took care of you is not available. Once you are discharged, your primary care physician will handle any further medical issues. Please note that NO REFILLS for any discharge medications will be authorized once you are discharged, as it is imperative that you return to your primary care physician (or establish a relationship with a primary care physician if you do not have one) for your aftercare needs so that they can reassess your need for medications and monitor your lab values.    Today   CHIEF COMPLAINT:   Chief Complaint  Patient presents with  . Abdominal Pain    HISTORY OF PRESENT ILLNESS:  Denise Henson  is a 74 y.o. female came in with back pain and abdominal pain   VITAL SIGNS:  Blood pressure (!) 159/77, pulse 68, temperature 98.3 F (36.8 C), temperature source Oral, resp. rate 16, height 5\' 4"  (1.626 m), weight 88.5 kg, SpO2 95 %.    PHYSICAL EXAMINATION:  GENERAL:  74 y.o.-year-old patient lying in the bed with no acute distress.  EYES: Pupils equal, round, reactive to light and accommodation. No scleral icterus. HEENT: Head atraumatic, normocephalic. Oropharynx and nasopharynx clear.  LUNGS: Normal breath sounds bilaterally, no wheezing, rales,rhonchi or crepitation. No use of accessory muscles of respiration.  CARDIOVASCULAR: S1, S2 normal. No murmurs, rubs, or gallops.  ABDOMEN: Soft, non-tender, non-distended. Bowel sounds present. No organomegaly or mass.  No CVA  tenderness EXTREMITIES: No pedal edema, cyanosis, or clubbing.  NEUROLOGIC: Cranial nerves II through XII are intact. PSYCHIATRIC: The patient is alert and oriented x 3.  SKIN: No obvious rash, lesion, or ulcer.   DATA REVIEW:   CBC Recent Labs  Lab 11/07/19 1920  WBC 15.2*  HGB 13.2  HCT 39.8  PLT 222    Chemistries  Recent Labs  Lab 11/07/19 1920  NA 133*  K 3.8  CL 99  CO2 24  GLUCOSE 115*  BUN 20  CREATININE 1.61*  CALCIUM 9.9  AST 44*  ALT 50*  ALKPHOS 76  BILITOT 1.2    Microbiology Results  Results for orders placed or performed during the hospital encounter of 11/07/19  SARS Coronavirus 2 by RT PCR (hospital order, performed in [facility]  hospital lab) Nasopharyngeal Nasopharyngeal Swab     Status: None   Collection Time: 11/08/19  2:40 AM   Specimen: Nasopharyngeal Swab  Result Value Ref Range Status   SARS Coronavirus 2 NEGATIVE NEGATIVE Final    Comment: (NOTE) SARS-CoV-2 target nucleic acids are NOT DETECTED. The SARS-CoV-2 RNA is generally detectable in upper and lower respiratory specimens during the acute phase of infection. The lowest concentration of SARS-CoV-2 viral copies this assay can detect is 250 copies / mL. A negative result does not preclude SARS-CoV-2 infection and should not be used as the sole basis for treatment or other patient management decisions.  A negative result may occur with improper specimen collection / handling, submission of specimen other than nasopharyngeal swab, presence of viral mutation(s) within the areas targeted by this assay, and inadequate number of viral copies (<250 copies / mL). A negative result must be combined with clinical observations, patient history, and epidemiological information. Fact Sheet for Patients:   StrictlyIdeas.no Fact Sheet for Healthcare Providers: BankingDealers.co.za This test is not yet approved or cleared  by the [place] FDA  and has been authorized for detection and/or diagnosis of SARS-CoV-2 by FDA under an Emergency Use Authorization (EUA).  This EUA will remain in effect (meaning this test can be used) for the duration of the COVID-19 declaration under Section 564(b)(1) of the Act, 21 U.S.C. section 360bbb-3(b)(1), unless the authorization is terminated or revoked sooner. Performed at [facility], [address] [place], [place] [address]     RADIOLOGY:  Bea Graff 1 View  Result Date: 11/08/2019 CLINICAL DATA:  Follow-up renal calculus EXAM: ABDOMEN - 1 VIEW COMPARISON:  CT from the previous day. FINDINGS: Scattered large and small bowel gas is noted. Changes of prior cholecystectomy are seen. The previously seen dense nephrogram and pyelogram seen on the previous CT has resolved. Vague calcification is noted just to the left of the midline which may represent the known left UVJ stone. Degenerative changes of lumbar spine are noted. IMPRESSION: Vague calcification to the left of the midline which may represent the known distal left ureteral stone. The previously seen dense nephrogram and pyelogram has improved in the interval from the recent exam. Electronically Signed   By: Inez Catalina M.D.   On: 11/08/2019 11:56   CT ABDOMEN PELVIS W CONTRAST  Result Date: 11/06/2019 CLINICAL DATA:  Left lower quadrant pain, emesis and diarrhea EXAM: CT ABDOMEN AND PELVIS WITH CONTRAST TECHNIQUE: Multidetector CT imaging of the abdomen and pelvis was performed using the standard protocol following bolus administration of intravenous contrast. CONTRAST:  120mL OMNIPAQUE IOHEXOL 300 MG/ML  SOLN COMPARISON:  None FINDINGS: Lower chest: Basilar atelectatic changes. Additional atelectasis in the left cardiophrenic sulcus adjacent a large hiatal hernia. No consolidation or pleural effusion. Mild cardiomegaly with coronary artery atherosclerosis. No pericardial effusion. Hepatobiliary: Diffuse hepatic hypoattenuation  compatible with hepatic steatosis. Scattered fluid attenuation cysts seen throughout the right and left lobes liver largest in the right is a bilobed cyst measuring 2.6 cm (2/29) and largest in the left measures up to 1.4 cm (2/15). No concerning focal liver lesion. Diffuse hepatic hypoattenuation. Patient is post cholecystectomy with slight prominence of the extrahepatic biliary tree likely related to senescent change and reservoir effect. No calcified intraductal gallstones or frank biliary dilatation. Pancreas: Unremarkable. No pancreatic ductal dilatation or surrounding inflammatory changes. Spleen: Normal in size without focal abnormality. Adrenals/Urinary Tract: Normal adrenal glands. Slight delay in the left renal nephrogram and excretion. Right kidney normally  enhances and excretes. Asymmetrically increased left perinephric stranding with at least moderate hydroureteronephrosis to the level of the urinary bladder where a 3 mm calculus is seen in the vicinity of the left ureterovesicular junction, possibly within the UVJ orifice or recently passed into the urinary bladder. Trace amount of fluid in the perinephric and pararenal spaces could suggest a small calyceal rupture. Multiple parapelvic cysts are seen bilaterally as well. No concerning renal masses. Punctate nonobstructing calculus seen in the upper pole right kidney (5/53) no obstructive right urolithiasis or hydronephrosis. Urinary bladder is largely decompressed at the time of exam and therefore poorly evaluated by CT imaging. No other gross bladder abnormality is seen. Stomach/Bowel: Large hiatal hernia, as above with slight organo-axial rotation of the herniated gastric segment. No convincing evidence of vascular compromise at this time. Passage of ingested material beyond this level argues against mechanical obstruction. Distal stomach and duodenal sweep are unremarkable. Some fecalization of the distal small bowel contents are present without  evidence of mechanical obstruction elsewhere in the bowel. Moderate colonic stool burden. Numerous pancolonic diverticulosis without focal inflammation to suggest acute diverticulitis. Vascular/Lymphatic: Atherosclerotic calcification of the abdominal aorta and branch vessels. No aneurysm or ectasia. Major venous structures are unremarkable. No enlarged abdominopelvic nodes. Reactive adenopathy in the retroperitoneum. Reproductive: Normal appearance of the uterus and adnexal structures. Other: Stranding and trace fluid in the retroperitoneum, as detailed above. No abdominopelvic free free fluid or air seen elsewhere. Mild body wall edema. Posterior injection granulomata Musculoskeletal: Multilevel degenerative changes are present in the imaged portions of the spine. No acute osseous abnormality or suspicious osseous lesion. IMPRESSION: 1. 3 mm calculus in the vicinity of the left ureterovesicular junction, possibly within the UVJ orifice or recently passed into the urinary bladder. Upstream moderate left hydroureteronephrosis as well as physiologic delay of the nephrogram and excretion with trace amount of fluid in the perinephric and pararenal spaces possibly reactive though could also suggest a small calyceal rupture. 2. Moderate colonic stool burden with fecalization of the distal small bowel contents without evidence of mechanical obstruction elsewhere in the bowel. Findings could reflect slowed intestinal transit/constipation. 3. Hepatic steatosis. 4. Colonic diverticulosis without evidence of acute diverticulitis. 5. Large hiatal hernia with slight organo-axial rotation of the herniated gastric segment. No convincing evidence of vascular compromise or obstruction at this time. 6. Additional punctate nonobstructing calculus in the upper pole right kidney. 7. Aortic Atherosclerosis (ICD10-I70.0). Electronically Signed   By: Lovena Le M.D.   On: 11/06/2019 21:38   CT Renal Stone Study  Result Date:  11/08/2019 CLINICAL DATA:  Urinary tract stone EXAM: CT ABDOMEN AND PELVIS WITHOUT CONTRAST TECHNIQUE: Multidetector CT imaging of the abdomen and pelvis was performed following the standard protocol without IV contrast. COMPARISON:  May 13 and 9:11 p.m. FINDINGS: Lower chest: The visualized heart size within normal limits. No pericardial fluid/thickening. There is a large hiatal hernia present. The visualized portions of the lungs are clear. Hepatobiliary: Again noted is a ill-defined low-density lesions within the liver parenchyma. The largest measuring 2 cm in the posterior right liver lobe. There is diffuse low density seen throughout the liver parenchyma. The patient is status post cholecystectomy. No biliary ductal dilation. Pancreas:  Unremarkable.  No surrounding inflammatory changes. Spleen: Normal in size. Although limited due to the lack of intravenous contrast, normal in appearance. Adrenals/Urinary Tract: Both adrenal glands appear normal. Again seen is moderate left-sided hydronephrosis with delayed contrast excretion from the prior exam down to the level of the  UVJ. Again noted is significant surrounding perinephric and periureteral stranding which extends down the retroperitoneum and not significantly changed since the prior exam. Again noted is a tiny 3 mm calculus in the posterior right bladder. There is contrast seen filling the bladder. Again noted is a right-sided parapelvic cysts with tiny punctate renal calculi in the upper pole. Stomach/Bowel: The stomach, small bowel, and colon are normal in appearance. No inflammatory changes or obstructive findings. Scattered colonic diverticulosis is seen without diverticulitis. Vascular/Lymphatic: There are no enlarged abdominal or pelvic lymph nodes. No significant gross vascular findings are present. Reproductive: The uterus and adnexa are unremarkable. Other: No evidence of abdominal wall mass or hernia. Musculoskeletal: No acute or significant  osseous findings. IMPRESSION: Again noted is moderate left hydronephrosis with perinephric and periureteral stranding down to the level of the posterior left bladder there is a 3 mm probable recently passed calculus. This is not significantly changed from the prior exam. There is delayed contrast excretion throughout the left collecting system from the prior CT. Diverticulosis without diverticulitis. Punctate nonobstructing right renal calculus. Aortic Atherosclerosis (ICD10-I70.0). Electronically Signed   By: Prudencio Pair M.D.   On: 11/08/2019 00:04     Management plans discussed with the patient, and she is in agreement.  CODE STATUS:     Code Status Orders  (From admission, onward)         Start     Ordered   11/08/19 0129  Full code  Continuous     11/08/19 0130        Code Status History    This patient has a current code status but no historical code status.   Advance Care Planning Activity      TOTAL TIME TAKING CARE OF THIS PATIENT: 35 minutes.    Loletha Grayer M.D on 11/08/2019 at 1:05 PM  Between 7am to 6pm - Pager - [phone]  After 6pm go to [email or web address] - password [email or web address]  [facility]  CC: Primary care physician; Venia Carbon, MD

## 2019-11-08 NOTE — ED Provider Notes (Addendum)
[facility] Emergency Department Provider Note  ____________________________________________   First MD Initiated Contact with Patient 11/07/19 2304     (approximate)  I have reviewed the triage vital signs and the nursing notes.   HISTORY  Chief Complaint Abdominal Pain    HPI Denise Henson is a 74 y.o. female who presents for evaluation of worsening back pain and  and left-sided flank and abdominal pain.  She was seen yesterday in the emergency department and diagnosed with a 3 mm ureteral stone at the UVJ.  She had no signs of infection at that time including no leukocytosis and no fever.  She was hypertensive but this was thought to be secondary to acute illness and pain.  She was discharged for outpatient follow-up.  She reports that today she felt fine when she woke up but in the early afternoon she had acute onset of worsening sharp stabbing pain in her left back and flank radiating to the left lower abdomen.  She has had increased urinary frequency including some urinary incontinence and dysuria.  No gross hematuria.  She is also had chills and subjective fever throughout the day.  Nausea but no vomiting.  She has been trying to drink a lot of fluids but has not been eating very much.  She denies sore throat, chest pain, shortness of breath, and any other abdominal pain except as described above.  Nothing in particular makes the symptoms better or worse but they are severe and she cannot find a position of comfort.        Past Medical History:  Diagnosis Date  . Allergic rhinitis due to pollen   . Allergy   . Anemia   . Aortic insufficiency 10/08   Mild; echocardiogram showed EF 55-65% with mild AI  . Arthritis   . Asthma    as a child  . Coronary artery disease    One-vessel; s/p LIMA-LAD, [facility] 2007  . GERD (gastroesophageal reflux disease)   . Hx of pleural effusion 2007   after CABG  . Hyperlipidemia    Unable to tolerate  simvastatin 40 due to myalgias  . Hypertension     Patient Active Problem List   Diagnosis Date Noted  . Mood disorder ([place]) 03/19/2019  . Left sided sciatica 08/07/2017  . Coronary artery disease   . Osteoarthritis cervical spine 11/24/2016  . Chronic venous insufficiency 10/27/2015  . Advance directive discussed with patient 09/25/2014  . Allergic rhinitis due to pollen   . Routine general medical examination at a health care facility 09/27/2011  . Osteopenia 09/26/2010  . Essential hypertension, benign 05/27/2008  . GERD 09/28/2006    Past Surgical History:  Procedure Laterality Date  . CESAREAN SECTION    . CHOLECYSTECTOMY    . COLONOSCOPY    . CORONARY ARTERY BYPASS GRAFT  5/07   Readmitted for post op pleural effusions  . DOPPLER ECHOCARDIOGRAPHY     NV LV EF 1+ AI/MR  . EXCISION METACARPAL MASS Left 02/21/2018   Procedure: EXCISION METACARPAL MASS;  Surgeon: Hessie Knows, MD;  Location: [facility] ORS;  Service: Orthopedics;  Laterality: Left;  . Stress imaging  6/07   Negative EF 67%  . TONSILLECTOMY      Prior to Admission medications   Medication Sig Start Date End Date Taking? Authorizing Provider  aspirin EC 325 MG tablet Take 650 mg by mouth daily as needed for moderate pain.    [provider]  Calcium Carb-Cholecalciferol (CALCIUM  500 +D) 500-400 MG-UNIT TABS Take 1 tablet by mouth daily.    [provider]  Ferrous Sulfate (IRON PO) Take 35 mg by mouth 2 (two) times daily.     [provider]  Glucosamine 500 MG CAPS Take 500 mg by mouth 2 (two) times daily.    [provider]  losartan (COZAAR) 50 MG tablet Take 1 tablet (50 mg total) by mouth daily. 03/19/19   Venia Carbon, MD  Multiple Vitamin (MULTIVITAMIN PO) Take by mouth.    [provider]  Omega-3 Fatty Acids (FISH OIL) 1000 MG CAPS Take 1,000 mg by mouth 2 (two) times daily.    [provider]  omeprazole (PRILOSEC) 20 MG capsule Take 1 capsule  (20 mg total) by mouth daily. 03/19/19   Venia Carbon, MD    Allergies Lisinopril, Codeine, and Morphine  Family History  Problem Relation Age of Onset  . Heart attack Mother 18  . Heart attack Maternal Grandmother   . Cancer Neg Hx        Breast or colon  . Diabetes Neg Hx   . Hypertension Neg Hx   . Colon cancer Neg Hx   . Colon polyps Neg Hx   . Esophageal cancer Neg Hx   . Rectal cancer Neg Hx   . Stomach cancer Neg Hx     Social History Social History   Tobacco Use  . Smoking status: Former Smoker    Types: Cigarettes    Quit date: 06/26/1994    Years since quitting: 25.3  . Smokeless tobacco: Never Used  Substance Use Topics  . Alcohol use: Yes    Comment: one daily  . Drug use: No    Review of Systems Constitutional: No fever/chills Eyes: No visual changes. ENT: No sore throat. Cardiovascular: Denies chest pain. Respiratory: Denies shortness of breath. Gastrointestinal: Left-sided flank and back pain as well as left lower quadrant pain.  Nausea, no vomiting. Genitourinary: Increased urinary frequency and urgency with some dysuria. Musculoskeletal: Left flank and back pain. Integumentary: Negative for rash. Neurological: Negative for headaches, focal weakness or numbness.   ____________________________________________   PHYSICAL EXAM:  VITAL SIGNS: ED Triage Vitals  Enc Vitals Group     BP 11/07/19 1913 (!) 198/84     Pulse Rate 11/07/19 1913 81     Resp 11/07/19 1913 18     Temp 11/07/19 1913 98.3 F (36.8 C)     Temp Source 11/07/19 1913 Oral     SpO2 11/07/19 1913 98 %     Weight 11/07/19 1914 88.5 kg (195 lb)     Height 11/07/19 1914 1.626 m (5\' 4" )     Head Circumference --      Peak Flow --      Pain Score 11/07/19 1914 7     Pain Loc --      Pain Edu? --      Excl. in [place]? --     Constitutional: Alert and oriented.  Patient appears uncomfortable but is not in severe distress. Eyes: Conjunctivae are normal.  Head:  Atraumatic. Nose: No congestion/rhinnorhea. Mouth/Throat: Patient is wearing a mask. Neck: No stridor.  No meningeal signs.   Cardiovascular: Normal rate, regular rhythm. Good peripheral circulation. Grossly normal heart sounds. Respiratory: Normal respiratory effort.  No retractions. Gastrointestinal: Soft and no distention.  Mild left sided tenderness to palpation, no evidence of peritonitis. Musculoskeletal: Left CVA tenderness to percussion.  No lower extremity tenderness nor edema. No  gross deformities of extremities. Neurologic:  Normal speech and language. No gross focal neurologic deficits are appreciated.  Skin:  Skin is warm, dry and intact. Psychiatric: Mood and affect are normal. Speech and behavior are normal.  ____________________________________________   LABS (all labs ordered are listed, but only abnormal results are displayed)  Labs Reviewed  COMPREHENSIVE METABOLIC PANEL - Abnormal; Notable for the following components:      Result Value   Sodium 133 (*)    Glucose, Bld 115 (*)    Creatinine, Ser 1.61 (*)    AST 44 (*)    ALT 50 (*)    GFR calc non Af Amer 31 (*)    GFR calc Af Amer 36 (*)    All other components within normal limits  CBC - Abnormal; Notable for the following components:   WBC 15.2 (*)    All other components within normal limits  URINALYSIS, COMPLETE (UACMP) WITH MICROSCOPIC - Abnormal; Notable for the following components:   Color, Urine YELLOW (*)    APPearance CLOUDY (*)    Specific Gravity, Urine 1.031 (*)    Hgb urine dipstick SMALL (*)    Ketones, ur 20 (*)    Protein, ur 30 (*)    All other components within normal limits  SARS CORONAVIRUS 2 BY RT PCR (HOSPITAL ORDER, [place] [facility])  LIPASE, BLOOD   ____________________________________________  EKG  None - EKG not ordered by ED physician ____________________________________________  RADIOLOGY Ursula Alert, personally viewed and evaluated these  images (plain radiographs) as part of my medical decision making, as well as reviewing the written report by the radiologist.  ED MD interpretation: No significant change from prior with left-sided perinephric and periureteral stranding and a 3 mm stone that is either at the UVJ or has passed into the posterior bladder.  Official radiology report(s): CT Renal Stone Study  Result Date: 11/08/2019 CLINICAL DATA:  Urinary tract stone EXAM: CT ABDOMEN AND PELVIS WITHOUT CONTRAST TECHNIQUE: Multidetector CT imaging of the abdomen and pelvis was performed following the standard protocol without IV contrast. COMPARISON:  May 13 and 9:11 p.m. FINDINGS: Lower chest: The visualized heart size within normal limits. No pericardial fluid/thickening. There is a large hiatal hernia present. The visualized portions of the lungs are clear. Hepatobiliary: Again noted is a ill-defined low-density lesions within the liver parenchyma. The largest measuring 2 cm in the posterior right liver lobe. There is diffuse low density seen throughout the liver parenchyma. The patient is status post cholecystectomy. No biliary ductal dilation. Pancreas:  Unremarkable.  No surrounding inflammatory changes. Spleen: Normal in size. Although limited due to the lack of intravenous contrast, normal in appearance. Adrenals/Urinary Tract: Both adrenal glands appear normal. Again seen is moderate left-sided hydronephrosis with delayed contrast excretion from the prior exam down to the level of the UVJ. Again noted is significant surrounding perinephric and periureteral stranding which extends down the retroperitoneum and not significantly changed since the prior exam. Again noted is a tiny 3 mm calculus in the posterior right bladder. There is contrast seen filling the bladder. Again noted is a right-sided parapelvic cysts with tiny punctate renal calculi in the upper pole. Stomach/Bowel: The stomach, small bowel, and colon are normal in appearance.  No inflammatory changes or obstructive findings. Scattered colonic diverticulosis is seen without diverticulitis. Vascular/Lymphatic: There are no enlarged abdominal or pelvic lymph nodes. No significant gross vascular findings are present. Reproductive: The uterus and adnexa are unremarkable. Other: No evidence  of abdominal wall mass or hernia. Musculoskeletal: No acute or significant osseous findings. IMPRESSION: Again noted is moderate left hydronephrosis with perinephric and periureteral stranding down to the level of the posterior left bladder there is a 3 mm probable recently passed calculus. This is not significantly changed from the prior exam. There is delayed contrast excretion throughout the left collecting system from the prior CT. Diverticulosis without diverticulitis. Punctate nonobstructing right renal calculus. Aortic Atherosclerosis (ICD10-I70.0). Electronically Signed   By: Prudencio Pair M.D.   On: 11/08/2019 00:04    ____________________________________________   PROCEDURES   Procedure(s) performed (including Critical Care):  Procedures   ____________________________________________   INITIAL IMPRESSION / MDM / [place] / ED COURSE  As part of my medical decision making, I reviewed the following data within the [place] notes reviewed and incorporated, Labs reviewed , Discussed with admitting physician , Discussed with urologist (Dr. Bernardo Heater) and reviewed Notes from prior ED visits   Differential diagnosis includes, but is not limited to, obstructive uropathy, UTI/pyelonephritis, sepsis, renal colic.  The patient has had some lab changes from yesterday.  She now has evidence of acute kidney injury with creatinine of 1.6 and a decreased GFR down to 31 (her creatinine was 1.1 yesterday).  Her sodium is slightly decreased at 133.  Her urinalysis redemonstrates ketonuria and she has an increased number of white blood cells in the urine  although no bacteria is identified.  Urine culture is pending from yesterday.  She now has a leukocytosis of 15.2 whereas yesterday she had a white blood cell count of 9.  Given the CT results from yesterday and the concern for an infected and obstructive stone in the setting of the changes from yesterday as described above, I will obtain another CT renal stone protocol to see if the stone has clearly passed or if she has any improvement on her scan.      Clinical Course as of Nov 08 111  Sat Nov 08, 2019  0019 CT scan is essentially unchanged from prior but redemonstrates perinephric stranding and periureteral stranding with a 3 mm stone that could still be at the UVJ but also may have passed into the posterior bladder.  Regardless, given her leukocytosis, subjective fever and chills, acute kidney injury, questionable infected urine with ketonuria, etc., I called and spoke with the urologist, Dr. Bernardo Heater.  Dr. Bernardo Heater suggested that, under the circumstances, we admit the patient to the hospital for treatment of pyelonephritis and he will reassess her in the morning to determine if she needs a ureteral stent, particularly if she spikes a fever or has any other indication of ongoing infection or worsening obstructive pathology.  I updated the patient.  I ordered ceftriaxone 1 g IV, 1 L normal saline, n.p.o. status, morphine 4 mg IV, Zofran 4 mg IV.  I am consulting the hospitalist service for admission.   [CF]  0040 Discussed case in person with Dr. Damita Dunnings who will admit the patient.   [CF]  0041 Patient says she cannot take morphine because it makes her anxious.  She felt better with what she got yesterday which apparently was Haldol 2 mg IV.  I think it would be appropriate to try droperidol 2.5 mg IV and I reviewed an old EKG which demonstrates a normal QTc interval.   [CF]  0112 The patient was already hypertensive as has been documented on previous ED visits, and I was hoping that her blood  pressure would come  down after the droperidol, but instead it is gone up with a systolic greater than [id number].  I have ordered labetalol 15 mg IV and I made the hospitalist, Dr. Damita Dunnings, aware of the issue and of my treatment.   [CF]    Clinical Course User Index [CF] Hinda Kehr, MD     ____________________________________________  FINAL CLINICAL IMPRESSION(S) / ED DIAGNOSES  Final diagnoses:  Pyelonephritis  Obstructive uropathy  Acute kidney injury ([place])  Ketonuria  Volume depletion  Essential hypertension     MEDICATIONS GIVEN DURING THIS VISIT:  Medications  cefTRIAXone (ROCEPHIN) 1 g in sodium chloride 0.9 % 100 mL IVPB (1 g Intravenous New Bag/Given 11/08/19 0103)  labetalol (NORMODYNE) injection 15 mg (has no administration in time range)  sodium chloride 0.9 % bolus 1,000 mL (1,000 mLs Intravenous New Bag/Given 11/08/19 0108)  droperidol (INAPSINE) 2.5 MG/ML injection 2.5 mg (2.5 mg Intravenous Given 11/08/19 0109)     ED Discharge Orders    None      *Please note:  INSIYA RUDIGER was evaluated in Emergency Department on 11/08/2019 for the symptoms described in the history of present illness. She was evaluated in the context of the global COVID-19 pandemic, which necessitated consideration that the patient might be at risk for infection with the SARS-CoV-2 virus that causes COVID-19. Institutional protocols and algorithms that pertain to the evaluation of patients at risk for COVID-19 are in a state of rapid change based on information released by regulatory bodies including the CDC and federal and state organizations. These policies and algorithms were followed during the patient's care in the ED.  Some ED evaluations and interventions may be delayed as a result of limited staffing during the pandemic.*  Note:  This document was prepared using Dragon voice recognition software and may include unintentional dictation errors.   Hinda Kehr, MD 11/08/19 [id number]     Hinda Kehr, MD 11/08/19 [id number]    Hinda Kehr, MD 11/08/19 [id number]

## 2019-11-08 NOTE — ED Notes (Signed)
MD Karma Greaser notified about pt's BP. See order for IV labetalol 15mg .

## 2019-11-08 NOTE — ED Notes (Signed)
Dr. Leslye Peer in to see pt, pt is sleeping at this time, he will come back later to see pt.

## 2019-11-17 ENCOUNTER — Telehealth: Payer: Self-pay | Admitting: Urology

## 2019-11-17 NOTE — Telephone Encounter (Signed)
App made and mailed ° °Denise Henson ° °

## 2019-11-17 NOTE — Telephone Encounter (Signed)
-----   Message from Abbie Sons, MD sent at 11/17/2019  8:19 AM EDT ----- Regarding: Hospital follow-up Please schedule PA follow-up 4-6 weeks

## 2019-11-18 ENCOUNTER — Telehealth: Payer: Self-pay

## 2019-11-18 NOTE — Telephone Encounter (Signed)
-----   Message -----  From: Pilar Grammes, CMA  Sent: 11/13/2019  4:38 PM EDT  To: Venia Carbon, MD   Pt said she is feeling just fine. She has gone to the beach and relaxing. She can call and schedule an OV if needed. No pain. Taking Flomax and antibiotics.  ----- Message -----  From: Randall An, RN  Sent: 11/11/2019  8:09 AM EDT  To: Pilar Grammes, CMA    ----- Message -----  From: Venia Carbon, MD  Sent: 11/10/2019  4:46 PM EDT  To: Randall An, RN   Needs call and probably follow up  Not a candidate for TCM  ----- Message -----  From: Jesusita Oka, LPN  Sent: [id number]  8:57 AM EDT  To: Venia Carbon, MD, Randall An, RN   Just seen in ED. Does not qualify for TCM.  ----- Message -----  From: Venia Carbon, MD  Sent: 11/09/2019  2:55 PM EDT  To: Jesusita Oka, LPN   Should have call but may not want the appt---can wait till I get back if she wants to come in (otherwise, may just follow with urology---she passed a stone)  ----- Message -----  From: Loletha Grayer, MD  Sent: 11/08/2019  1:13 PM EDT  To: Venia Carbon, MD

## 2019-12-22 ENCOUNTER — Ambulatory Visit: Payer: Self-pay | Admitting: Physician Assistant

## 2020-02-23 ENCOUNTER — Other Ambulatory Visit: Payer: Self-pay | Admitting: Internal Medicine

## 2020-03-10 ENCOUNTER — Telehealth: Payer: Self-pay | Admitting: Internal Medicine

## 2020-03-10 DIAGNOSIS — I10 Essential (primary) hypertension: Secondary | ICD-10-CM

## 2020-03-10 NOTE — Telephone Encounter (Signed)
Pt called she has cpx scheduled 10/1 and wants to have labs done prior to appointment  She would like to do next week.  Ok to schedule

## 2020-03-11 NOTE — Telephone Encounter (Signed)
Orders placed. Okay to schedule fasting blood work appointment

## 2020-03-11 NOTE — Telephone Encounter (Signed)
I left a detailed message on patient's voice mail to call back and schedule fasting lab appointment.

## 2020-03-15 ENCOUNTER — Other Ambulatory Visit: Payer: Self-pay

## 2020-03-15 ENCOUNTER — Other Ambulatory Visit (INDEPENDENT_AMBULATORY_CARE_PROVIDER_SITE_OTHER): Payer: Medicare Other

## 2020-03-15 DIAGNOSIS — I10 Essential (primary) hypertension: Secondary | ICD-10-CM | POA: Diagnosis not present

## 2020-03-15 LAB — RENAL FUNCTION PANEL
Albumin: 4.3 g/dL (ref 3.5–5.2)
BUN: 17 mg/dL (ref 6–23)
CO2: 28 mEq/L (ref 19–32)
Calcium: 9.4 mg/dL (ref 8.4–10.5)
Chloride: 104 mEq/L (ref 96–112)
Creatinine, Ser: 0.99 mg/dL (ref 0.40–1.20)
GFR: 54.68 mL/min — ABNORMAL LOW (ref >60.00)
Glucose, Bld: 100 mg/dL — ABNORMAL HIGH (ref 70–99)
Phosphorus: 4 mg/dL (ref 2.3–4.6)
Potassium: 4.8 mEq/L (ref 3.5–5.1)
Sodium: 141 mEq/L (ref 135–145)

## 2020-03-15 LAB — HEPATIC FUNCTION PANEL
ALT: 33 U/L (ref 0–35)
AST: 27 U/L (ref 0–37)
Albumin: 4.3 g/dL (ref 3.5–5.2)
Alkaline Phosphatase: 70 U/L (ref 39–117)
Bilirubin, Direct: 0.1 mg/dL (ref 0.0–0.3)
Total Bilirubin: 0.6 mg/dL (ref 0.2–1.2)
Total Protein: 6.7 g/dL (ref 6.0–8.3)

## 2020-03-15 LAB — CBC
HCT: 42.8 % (ref 36.0–46.0)
Hemoglobin: 14.4 g/dL (ref 12.0–15.0)
MCHC: 33.5 g/dL (ref 30.0–36.0)
MCV: 97.7 fl (ref 78.0–100.0)
Platelets: 244 10*3/uL (ref 150.0–400.0)
RBC: 4.39 Mil/uL (ref 3.87–5.11)
RDW: 14 % (ref 11.5–15.5)
WBC: 5.4 10*3/uL (ref 4.0–10.5)

## 2020-03-15 LAB — LIPID PANEL
Cholesterol: 195 mg/dL (ref 0–200)
HDL: 58.4 mg/dL (ref >39.00)
LDL Cholesterol: 107 mg/dL — ABNORMAL HIGH (ref 0–99)
NonHDL: 136.23
Total CHOL/HDL Ratio: 3
Triglycerides: 148 mg/dL (ref 0.0–149.0)
VLDL: 29.6 mg/dL (ref 0.0–40.0)

## 2020-03-15 LAB — T4, FREE: Free T4: 0.94 ng/dL (ref 0.60–1.60)

## 2020-03-26 ENCOUNTER — Encounter: Payer: Self-pay | Admitting: Internal Medicine

## 2020-03-26 ENCOUNTER — Other Ambulatory Visit: Payer: Self-pay

## 2020-03-26 ENCOUNTER — Ambulatory Visit (INDEPENDENT_AMBULATORY_CARE_PROVIDER_SITE_OTHER): Payer: Medicare Other | Admitting: Internal Medicine

## 2020-03-26 VITALS — BP 140/84 | HR 78 | Temp 97.4°F | Ht 64.0 in | Wt 185.0 lb

## 2020-03-26 DIAGNOSIS — I872 Venous insufficiency (chronic) (peripheral): Secondary | ICD-10-CM | POA: Diagnosis not present

## 2020-03-26 DIAGNOSIS — I10 Essential (primary) hypertension: Secondary | ICD-10-CM | POA: Diagnosis not present

## 2020-03-26 DIAGNOSIS — Z7189 Other specified counseling: Secondary | ICD-10-CM

## 2020-03-26 DIAGNOSIS — I700 Atherosclerosis of aorta: Secondary | ICD-10-CM | POA: Diagnosis not present

## 2020-03-26 DIAGNOSIS — K219 Gastro-esophageal reflux disease without esophagitis: Secondary | ICD-10-CM

## 2020-03-26 DIAGNOSIS — Z1211 Encounter for screening for malignant neoplasm of colon: Secondary | ICD-10-CM | POA: Diagnosis not present

## 2020-03-26 DIAGNOSIS — Z0000 Encounter for general adult medical examination without abnormal findings: Secondary | ICD-10-CM

## 2020-03-26 NOTE — Assessment & Plan Note (Signed)
I have personally reviewed the Medicare Annual Wellness questionnaire and have noted  1. The patient's medical and social history  2. Their use of alcohol, tobacco or illicit drugs  3. Their current medications and supplements  4. The patient's functional ability including ADL's, fall risks, home safety risks and hearing or visual              impairment.  5. Diet and physical activities  6. Evidence for depression or mood disorders  The patients weight, height, BMI and visual acuity have been recorded in the chart  I have made referrals, counseling and provided education to the patient based review of the above and I have provided the pt with a written personalized care plan for preventive services.   I have provided you with a copy of your personalized plan for preventive services. Please take the time to review along with your updated medication list.  Should have last colon--but never had it done. Will do FIT instead Needs at least one last mammogram Exercises regularly Flu vaccine and COVID booster at [facility] Consider shingrix at pharmacy

## 2020-03-26 NOTE — Assessment & Plan Note (Signed)
This is better now without medication

## 2020-03-26 NOTE — Progress Notes (Signed)
Subjective:    Patient ID: Denise Henson, female    DOB: April 02, 1946, 74 y.o.   MRN: [id number]  HPI Here for Medicare wellness visit and follow up of chronic health conditions This visit occurred during the SARS-CoV-2 public health emergency.  Safety protocols were in place, including screening questions prior to the visit, additional usage of staff PPE, and extensive cleaning of exam room while observing appropriate contact time as indicated for disinfecting solutions.   Reviewed form and advanced directives Reviewed other doctors Vision and hearing are okay Still enjoys scotch daily---1-2 No tobacco Exercising regularly No depression or anhedonia Independent with instrumental ADLs Mild memory issues---but nothing worrisome  Had been taken off losartan when in ER Had AKI when she had kidney stone---did finally pass it after being on flomax GFR was 31 then.  She restarted the losartan--last GFR up to 50's (CKD 3a) Did have hydronephrosis that resolved Also had aortic atherosclerosis  Mood has been good Anxiety is basically gone ---since husband's death and not dealing with his family  Takes the omeprazole regularly Will have occasional breakthrough---takes a second rarely No dysphagia  Current Outpatient Medications on File Prior to Visit  Medication Sig Dispense Refill  . aspirin EC 325 MG tablet Take 650 mg by mouth daily as needed for moderate pain.    . Calcium Carb-Cholecalciferol (CALCIUM 500 +D) 500-400 MG-UNIT TABS Take 1 tablet by mouth daily.    . Ferrous Sulfate (IRON PO) Take 35 mg by mouth every other day.     . Glucosamine 500 MG CAPS Take 500 mg by mouth 2 (two) times daily.    Marland Kitchen losartan (COZAAR) 50 MG tablet Take 50 mg by mouth daily.    . Multiple Vitamin (MULTIVITAMIN PO) Take by mouth.    . Omega-3 Fatty Acids (FISH OIL) 1000 MG CAPS Take 1,000 mg by mouth 2 (two) times daily.    Marland Kitchen omeprazole (PRILOSEC) 20 MG capsule TAKE 1 CAPSULE (20 MG TOTAL) BY  MOUTH DAILY. 90 capsule 3  . Turmeric (QC TUMERIC COMPLEX PO) Take 1 capsule by mouth 2 (two) times daily.     No current facility-administered medications on file prior to visit.    Allergies  Allergen Reactions  . Lisinopril     cough  . Codeine Anxiety    jittery   . Morphine Anxiety    jittery    Past Medical History:  Diagnosis Date  . Allergic rhinitis due to pollen   . Allergy   . Anemia   . Aortic insufficiency 10/08   Mild; echocardiogram showed EF 55-65% with mild AI  . Arthritis   . Asthma    as a child  . Coronary artery disease    One-vessel; s/p LIMA-LAD, [facility] 2007  . GERD (gastroesophageal reflux disease)   . Hx of pleural effusion 2007   after CABG  . Hyperlipidemia    Unable to tolerate simvastatin 40 due to myalgias  . Hypertension     Past Surgical History:  Procedure Laterality Date  . CESAREAN SECTION    . CHOLECYSTECTOMY    . COLONOSCOPY    . CORONARY ARTERY BYPASS GRAFT  5/07   Readmitted for post op pleural effusions  . DOPPLER ECHOCARDIOGRAPHY     NV LV EF 1+ AI/MR  . EXCISION METACARPAL MASS Left 02/21/2018   Procedure: EXCISION METACARPAL MASS;  Surgeon: Hessie Knows, MD;  Location: [facility] ORS;  Service: Orthopedics;  Laterality: Left;  . Stress imaging  6/07  Negative EF 67%  . TONSILLECTOMY      Family History  Problem Relation Age of Onset  . Heart attack Mother 35  . Heart attack Maternal Grandmother   . Cancer Neg Hx        Breast or colon  . Diabetes Neg Hx   . Hypertension Neg Hx   . Colon cancer Neg Hx   . Colon polyps Neg Hx   . Esophageal cancer Neg Hx   . Rectal cancer Neg Hx   . Stomach cancer Neg Hx     Social History   Socioeconomic History  . Marital status: Widowed    Spouse name: Not on file  . Number of children: 1  . Years of education: Not on file  . Highest education level: Not on file  Occupational History  . 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 Smoking status: Former Smoker    Types: Cigarettes    Quit date: 06/26/1994    Years since quitting: 25.7  . Smokeless tobacco: Never Used  Vaping Use  . Vaping Use: Never used  Substance and Sexual Activity  . Alcohol use: Yes    Comment: one daily  . Drug use: No  . Sexual activity: Never  Other Topics Concern  . Not on file  Social History Narrative   Has living will   Son [name], is health care POA   Would accept resuscitation attempts   No tube feeds if cognitively unaware   Social Determinants of Health   Financial Resource Strain:   . Difficulty of Paying Living Expenses: Not on file  Food Insecurity:   . Worried About Charity fundraiser in the Last Year: Not on file  . Ran Out of Food in the Last Year: Not on file  Transportation Needs:   . Lack of Transportation (Medical): Not on file  . Lack of Transportation (Non-Medical): Not on file  Physical Activity:   . Days of Exercise per Week: Not on file  . Minutes of Exercise per Session: Not on file  Stress:   . Feeling of Stress : Not on file  Social Connections:   . Frequency of Communication with Friends and Family: Not on file  . Frequency of Social Gatherings with Friends and Family: Not on file  . Attends Religious Services: Not on file  . Active Member of Clubs or Organizations: Not on file  . Attends Archivist Meetings: Not on file  . Marital Status: Not on file  Intimate Partner Violence:   . Fear of Current or Ex-Partner: Not on file  . Emotionally Abused: Not on file  . Physically Abused: Not on file  . Sexually Abused: Not on file   Review of Systems Appetite is good Has lost 12# over the year Sleeps fair---good night every 3 nights. No daytime somnolence Wears seat belt Teeth okay---sees dentist No skin concerns Bowels are better on lower iron---no blood No chest pain or SOB No dizziness or syncope No edema No urinary problems. Some urgency--but no  incontinence Some hip pain---but no worse (mostly after prolonged sitting)    Objective:   Physical Exam Constitutional:      Appearance: Normal appearance.  HENT:     Mouth/Throat:     Comments: No lesions Eyes:     Conjunctiva/sclera: Conjunctivae normal.     Pupils: Pupils are equal, round, and reactive to light.  Cardiovascular:     Rate and Rhythm: Normal rate and regular rhythm.     Pulses: Normal pulses.     Heart sounds: No murmur heard.  No gallop.   Pulmonary:     Effort: Pulmonary effort is normal.     Breath sounds: Normal breath sounds. No wheezing or rales.  Abdominal:     Palpations: Abdomen is soft.     Tenderness: There is no abdominal tenderness.  Musculoskeletal:        General: No swelling.     Cervical back: Neck supple.     Right lower leg: No edema.     Left lower leg: No edema.  Lymphadenopathy:     Cervical: No cervical adenopathy.  Skin:    General: Skin is warm.     Findings: No rash.  Neurological:     Mental Status: She is alert and oriented to person, place, and time.     Comments: President---"Biden, Trump, Obama" [id number] Recall 3/3  Psychiatric:        Mood and Affect: Mood normal.        Behavior: Behavior normal.            Assessment & Plan:

## 2020-03-26 NOTE — Assessment & Plan Note (Signed)
Doing fairly well with daily PPI Will continue

## 2020-03-26 NOTE — Patient Instructions (Signed)
Please schedule at least one last mammogram.

## 2020-03-26 NOTE — Assessment & Plan Note (Signed)
BP Readings from Last 3 Encounters:  03/26/20 140/84  11/08/19 (!) 171/78  11/06/19 (!) 163/76   Good control without meds

## 2020-03-26 NOTE — Assessment & Plan Note (Signed)
Seen on CT scan She is not excited about statin Had CABG---but not due to atherosclerosis (anatomic issue) Will hold off

## 2020-03-26 NOTE — Assessment & Plan Note (Signed)
See social history 

## 2020-04-01 ENCOUNTER — Other Ambulatory Visit (INDEPENDENT_AMBULATORY_CARE_PROVIDER_SITE_OTHER): Payer: Medicare Other

## 2020-04-01 DIAGNOSIS — Z1211 Encounter for screening for malignant neoplasm of colon: Secondary | ICD-10-CM

## 2020-04-01 LAB — FECAL OCCULT BLOOD, IMMUNOCHEMICAL: Fecal Occult Bld: NEGATIVE

## 2020-04-21 DIAGNOSIS — Z23 Encounter for immunization: Secondary | ICD-10-CM | POA: Diagnosis not present

## 2020-04-22 ENCOUNTER — Other Ambulatory Visit: Payer: Self-pay | Admitting: Internal Medicine

## 2020-04-27 DIAGNOSIS — Z1231 Encounter for screening mammogram for malignant neoplasm of breast: Secondary | ICD-10-CM | POA: Diagnosis not present

## 2020-04-27 LAB — HM MAMMOGRAPHY: HM Mammogram: NORMAL (ref 0–4)

## 2020-04-30 DIAGNOSIS — Z23 Encounter for immunization: Secondary | ICD-10-CM | POA: Diagnosis not present

## 2020-07-20 MED ORDER — LOSARTAN POTASSIUM 50 MG PO TABS
50.0000 mg | ORAL_TABLET | Freq: Every day | ORAL | 3 refills | Status: DC
Start: 2020-07-20 — End: 2021-05-05

## 2020-07-20 MED ORDER — OMEPRAZOLE 20 MG PO CPDR
20.0000 mg | DELAYED_RELEASE_CAPSULE | Freq: Every day | ORAL | 3 refills | Status: DC
Start: 2020-07-20 — End: 2021-05-05

## 2021-03-29 ENCOUNTER — Ambulatory Visit: Payer: Medicare Other | Admitting: Internal Medicine

## 2021-03-29 ENCOUNTER — Telehealth: Payer: Self-pay

## 2021-03-29 NOTE — Telephone Encounter (Addendum)
Sending note to support grp for appt and Dr Silvio Pate, and [facility] CMA. I spoke with pt; pt had traveled to [place] and returned home on 03/28/21; pt did home test on 03/28/21 which was + for covid; pt said symptoms began on 03/24/21. Pt said she has recovered from symptoms except has residual prod cough with clear phlegm. Pt has no other covid symptoms; pt said she feels tired but that is from the traveling. Pt said she is 75% back to her normal self but plans to quarantine until this weekend. While pt was in [place] she did wear a mask and social distance as much as possible. UC & ED precautions given and pt voiced understanding.Sent teams to [facility] CMA.

## 2021-03-29 NOTE — Telephone Encounter (Signed)
[facility] Night - Client Nonclinical Telephone Record AccessNurse Client [facility] Night - Client Client Site [place] Physician Viviana Simpler- MD Contact Type Call Who Is Calling Patient / Member / Family / Caregiver Caller Name [place] Phone Number [phone] Patient Name Denise Henson Patient DOB 10-14-1945 Call Type Message Only Information Provided Reason for Call Request to Reschedule Office Appointment Initial Comment Caller has 730 a.m., physical appt. Caller just ret'd home w/ Covid. Caller rcvd reminder call. Caller is not coming to appt tomorrow and would rather not do a virtual appt. Patient request to speak to RN No Additional Comment Caller states this is a Medicare physical, please reschedule before mid Nov. Caller is traveling internationally in Nov. Provided office hours. Caller declined triage. Disp. Time Disposition Final User 03/28/2021 8:00:40 PM General Information Provided Yes Lopez-Craine, Dahlia Call Closed By: Iver Nestle Transaction Date/Time: 03/28/2021 7:55:09 PM (ET)

## 2021-03-30 ENCOUNTER — Ambulatory Visit: Payer: Medicare Other | Admitting: Internal Medicine

## 2021-03-31 ENCOUNTER — Telehealth: Payer: Self-pay | Admitting: Internal Medicine

## 2021-03-31 DIAGNOSIS — I10 Essential (primary) hypertension: Secondary | ICD-10-CM

## 2021-03-31 NOTE — Telephone Encounter (Signed)
Labs ordered.

## 2021-03-31 NOTE — Addendum Note (Signed)
Addended by: Viviana Simpler I on: 03/31/2021 12:40 PM   Modules accepted: Orders

## 2021-03-31 NOTE — Telephone Encounter (Signed)
Pt called to reschedule missed AWV. She states she always does labs prior. I have scheduled her for a lab appt prior to seeing you for awv. Please place orders for patient to have labs drawn on 10/21.

## 2021-04-05 NOTE — Telephone Encounter (Signed)
Pt rescheduled to 04-20-21

## 2021-04-15 ENCOUNTER — Other Ambulatory Visit (INDEPENDENT_AMBULATORY_CARE_PROVIDER_SITE_OTHER): Payer: Medicare Other

## 2021-04-15 ENCOUNTER — Other Ambulatory Visit: Payer: Self-pay

## 2021-04-15 DIAGNOSIS — I10 Essential (primary) hypertension: Secondary | ICD-10-CM | POA: Diagnosis not present

## 2021-04-15 LAB — LIPID PANEL
Cholesterol: 190 mg/dL (ref 0–200)
HDL: 68.1 mg/dL (ref >39.00)
LDL Cholesterol: 97 mg/dL (ref 0–99)
NonHDL: 121.4
Total CHOL/HDL Ratio: 3
Triglycerides: 123 mg/dL (ref 0.0–149.0)
VLDL: 24.6 mg/dL (ref 0.0–40.0)

## 2021-04-15 LAB — RENAL FUNCTION PANEL
Albumin: 4.2 g/dL (ref 3.5–5.2)
BUN: 15 mg/dL (ref 6–23)
CO2: 29 mEq/L (ref 19–32)
Calcium: 9.5 mg/dL (ref 8.4–10.5)
Chloride: 103 mEq/L (ref 96–112)
Creatinine, Ser: 0.94 mg/dL (ref 0.40–1.20)
GFR: 59.13 mL/min — ABNORMAL LOW (ref >60.00)
Glucose, Bld: 105 mg/dL — ABNORMAL HIGH (ref 70–99)
Phosphorus: 3.7 mg/dL (ref 2.3–4.6)
Potassium: 4.3 mEq/L (ref 3.5–5.1)
Sodium: 141 mEq/L (ref 135–145)

## 2021-04-15 LAB — CBC
HCT: 41.6 % (ref 36.0–46.0)
Hemoglobin: 13.7 g/dL (ref 12.0–15.0)
MCHC: 32.8 g/dL (ref 30.0–36.0)
MCV: 97.9 fl (ref 78.0–100.0)
Platelets: 262 10*3/uL (ref 150.0–400.0)
RBC: 4.25 Mil/uL (ref 3.87–5.11)
RDW: 14.7 % (ref 11.5–15.5)
WBC: 5.1 10*3/uL (ref 4.0–10.5)

## 2021-04-15 LAB — HEPATIC FUNCTION PANEL
ALT: 48 U/L — ABNORMAL HIGH (ref 0–35)
AST: 45 U/L — ABNORMAL HIGH (ref 0–37)
Albumin: 4.2 g/dL (ref 3.5–5.2)
Alkaline Phosphatase: 84 U/L (ref 39–117)
Bilirubin, Direct: 0.2 mg/dL (ref 0.0–0.3)
Total Bilirubin: 1 mg/dL (ref 0.2–1.2)
Total Protein: 6.9 g/dL (ref 6.0–8.3)

## 2021-04-15 LAB — T4, FREE: Free T4: 1.07 ng/dL (ref 0.60–1.60)

## 2021-04-20 ENCOUNTER — Ambulatory Visit: Payer: Medicare Other | Admitting: Internal Medicine

## 2021-04-20 ENCOUNTER — Encounter: Payer: Self-pay | Admitting: Internal Medicine

## 2021-04-20 ENCOUNTER — Ambulatory Visit (INDEPENDENT_AMBULATORY_CARE_PROVIDER_SITE_OTHER): Payer: Medicare Other | Admitting: Internal Medicine

## 2021-04-20 ENCOUNTER — Other Ambulatory Visit: Payer: Self-pay

## 2021-04-20 ENCOUNTER — Telehealth: Payer: Self-pay

## 2021-04-20 VITALS — BP 140/90 | HR 144 | Temp 97.8°F | Ht 64.0 in | Wt 189.0 lb

## 2021-04-20 VITALS — BP 154/84 | HR 140 | Ht 64.0 in | Wt 191.0 lb

## 2021-04-20 DIAGNOSIS — I471 Supraventricular tachycardia, unspecified: Secondary | ICD-10-CM | POA: Insufficient documentation

## 2021-04-20 DIAGNOSIS — Z0000 Encounter for general adult medical examination without abnormal findings: Secondary | ICD-10-CM

## 2021-04-20 DIAGNOSIS — N1831 Chronic kidney disease, stage 3a: Secondary | ICD-10-CM | POA: Diagnosis not present

## 2021-04-20 DIAGNOSIS — I700 Atherosclerosis of aorta: Secondary | ICD-10-CM | POA: Diagnosis not present

## 2021-04-20 DIAGNOSIS — I10 Essential (primary) hypertension: Secondary | ICD-10-CM

## 2021-04-20 DIAGNOSIS — R000 Tachycardia, unspecified: Secondary | ICD-10-CM | POA: Diagnosis not present

## 2021-04-20 MED ORDER — AZITHROMYCIN 250 MG PO TABS: ORAL_TABLET | ORAL | 0 refills | Status: DC

## 2021-04-20 MED ORDER — BENZONATATE 200 MG PO CAPS: 200.0000 mg | ORAL_CAPSULE | Freq: Three times a day (TID) | ORAL | 0 refills | Status: DC | PRN

## 2021-04-20 NOTE — H&P (View-Only) (Signed)
HPI Denise Henson is referred by Dr. Silvio Pate for evaluation of SVT. She is a pleasant 75 yo woman with a h/o CAD, s/p CABG. She has HTN. She has minimal palpitations. She was seen in Dr. Alla German office today for a screening physical when she was noted to be in SVT. She has a long RP tachycardia. She notes Covid 4 weeks ago. She has felt poorly since then.  Allergies  Allergen Reactions   Lisinopril     cough   Codeine Anxiety    jittery    Morphine Anxiety    jittery     Current Outpatient Medications  Medication Sig Dispense Refill   azithromycin (ZITHROMAX) 250 MG tablet Take 2 tablets on day 1, then 1 tablet daily on days 2 through 5 6 tablet 0   benzonatate (TESSALON) 200 MG capsule Take 1 capsule (200 mg total) by mouth 3 (three) times daily as needed for cough. 60 capsule 0   Calcium Carb-Cholecalciferol 500-400 MG-UNIT TABS Take 1 tablet by mouth daily.     Ferrous Sulfate (IRON PO) Take 35 mg by mouth every other day.      Glucosamine 500 MG CAPS Take 500 mg by mouth 2 (two) times daily.     losartan (COZAAR) 50 MG tablet Take 1 tablet (50 mg total) by mouth daily. 90 tablet 3   Multiple Vitamin (MULTIVITAMIN PO) Take by mouth.     Omega-3 Fatty Acids (FISH OIL) 1000 MG CAPS Take 1,000 mg by mouth 2 (two) times daily.     omeprazole (PRILOSEC) 20 MG capsule Take 1 capsule (20 mg total) by mouth daily. 90 capsule 3   Turmeric (QC TUMERIC COMPLEX PO) Take 1 capsule by mouth 2 (two) times daily.     No current facility-administered medications for this visit.     Past Medical History:  Diagnosis Date   Allergic rhinitis due to pollen    Allergy    Anemia    Aortic insufficiency 10/08   Mild; echocardiogram showed EF 55-65% with mild AI   Arthritis    Asthma    as a child   Coronary artery disease    One-vessel; s/p LIMA-LAD, [facility] 2007   GERD (gastroesophageal reflux disease)    Hx of pleural effusion 2007   after CABG   Hyperlipidemia    Unable  to tolerate simvastatin 40 due to myalgias   Hypertension     ROS:   All systems reviewed and negative except as noted in the HPI.   Past Surgical History:  Procedure Laterality Date   CESAREAN SECTION     CHOLECYSTECTOMY     COLONOSCOPY     CORONARY ARTERY BYPASS GRAFT  5/07   Readmitted for post op pleural effusions   DOPPLER ECHOCARDIOGRAPHY     NV LV EF 1+ AI/MR   EXCISION METACARPAL MASS Left 02/21/2018   Procedure: EXCISION METACARPAL MASS;  Surgeon: Hessie Knows, MD;  Location: [facility] ORS;  Service: Orthopedics;  Laterality: Left;   Stress imaging  6/07   Negative EF 67%   TONSILLECTOMY       Family History  Problem Relation Age of Onset   Heart attack Mother 30   Heart attack Maternal Grandmother    Cancer Neg Hx        Breast or colon   Diabetes Neg Hx    Hypertension Neg Hx    Colon cancer Neg Hx    Colon polyps Neg Hx  Esophageal cancer Neg Hx    Rectal cancer Neg Hx    Stomach cancer Neg Hx      Social History   Socioeconomic History   Marital status: Widowed    Spouse name: Not on file   Number of children: 1   Years of education: Not on file   Highest education level: Not on file  Occupational History   Occupation: Pharmacist, 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[place])    Employer: RETIRED    Comment: Retired  Tobacco Use   Smoking status: Former    Types: Cigarettes    Quit date: 06/26/1994    Years since quitting: 26.8   Smokeless tobacco: Never  Vaping Use   Vaping Use: Never used  Substance and Sexual Activity   Alcohol use: Yes    Comment: one daily   Drug use: No   Sexual activity: Never  Other Topics Concern   Not on file  Social History Narrative   Has living will   Son [name], is health care POA   Would accept resuscitation attempts   No tube feeds if cognitively unaware   Social Determinants of Health   Financial Resource Strain: Not on file  Food Insecurity: Not on file  Transportation Needs: Not on file  Physical Activity: Not on file   Stress: Not on file  Social Connections: Not on file  Intimate Partner Violence: Not on file     BP (!) 154/84   Pulse (!) 140   Ht 5\' 4"  (1.626 m)   Wt 191 lb (86.6 kg)   SpO2 98%   BMI 32.79 kg/m   Physical Exam:  Well appearing NAD HEENT: Unremarkable Neck:  No JVD, no thyromegally Lymphatics:  No adenopathy Back:  No CVA tenderness Lungs:  Clear with no wheezes HEART:  Regular rate rhythm, no murmurs, no rubs, no clicks Abd:  soft, positive bowel sounds, no organomegally, no rebound, no guarding Ext:  2 plus pulses, no edema, no cyanosis, no clubbing Skin:  No rashes no nodules Neuro:  CN II through XII intact, motor grossly intact  EKG - reviewed. Long RP tachy at 140/min  Assess/Plan:  SVT - she is minimally symptomatic but is certainly at risk for the development of a tachy induced CM. I discussed the treatment options including medical therapy with a  beta blocker. My experience is that beta blockers rare do much to prevent/alleviate these types of incessant SVT's. I discussed the indications/risks/benefits/goals/expectations of EP study and catheter ablation and she is willing to proceed. We will plan to schedule early next week. CAD - she is s/p CABG. She does not have any anginal symptoms.  HTN - we will work on this after her ablation depending on her heart rate.  Denise Overlie Hunt Zajicek,MD

## 2021-04-20 NOTE — Assessment & Plan Note (Signed)
BP Readings from Last 3 Encounters:  04/20/21 140/90  03/26/20 140/84  11/08/19 (!) 171/78   Good control on the losartan May need CCB or beta blocker now

## 2021-04-20 NOTE — Assessment & Plan Note (Signed)
We have discussed statins and will not use

## 2021-04-20 NOTE — Patient Instructions (Signed)
Medication Instructions:  Your physician recommends that you continue on your current medications as directed. Please refer to the Current Medication list given to you today.  Labwork: None ordered.  Testing/Procedures: None ordered.  Follow-Up:  SEE INSTRUCTION LETTER  Any Other Special Instructions Will Be Listed Below (If Applicable).  If you need a refill on your cardiac medications before your next appointment, please call your pharmacy.   Cardiac Ablation Cardiac ablation is a procedure to destroy, or ablate, a small amount of heart tissue in very specific places. The heart has many electrical connections. Sometimes these connections are abnormal and can cause the heart to beat very fast or irregularly. Ablating some of the areas that cause problems can improve the heart's rhythm or return it to normal. Ablation may be done for people who: Have Wolff-Parkinson-White syndrome. Have fast heart rhythms (tachycardia). Have taken medicines for an abnormal heart rhythm (arrhythmia) that were not effective or caused side effects. Have a high-risk heartbeat that may be life-threatening. During the procedure, a small incision is made in the neck or the groin, and a long, thin tube (catheter) is inserted into the incision and moved to the heart. Small devices (electrodes) on the tip of the catheter will send out electrical currents. A type of X-ray (fluoroscopy) will be used to help guide the catheter and to provide images of the heart. Tell a health care provider about: Any allergies you have. All medicines you are taking, including vitamins, herbs, eye drops, creams, and over-the-counter medicines. Any problems you or family members have had with anesthetic medicines. Any blood disorders you have. Any surgeries you have had. Any medical conditions you have, such as kidney failure. Whether you are pregnant or may be pregnant. What are the risks? Generally, this is a safe procedure.  However, problems may occur, including: Infection. Bruising and bleeding at the catheter insertion site. Bleeding into the chest, especially into the sac that surrounds the heart. This is a serious complication. Stroke or blood clots. Damage to nearby structures or organs. Allergic reaction to medicines or dyes. Need for a permanent pacemaker if the normal electrical system is damaged. A pacemaker is a small computer that sends electrical signals to the heart and helps your heart beat normally. The procedure not being fully effective. This may not be recognized until months later. Repeat ablation procedures are sometimes done. What happens before the procedure? Medicines Ask your health care provider about: Changing or stopping your regular medicines. This is especially important if you are taking diabetes medicines or blood thinners. Taking medicines such as aspirin and ibuprofen. These medicines can thin your blood. Do not take these medicines unless your health care provider tells you to take them. Taking over-the-counter medicines, vitamins, herbs, and supplements. General instructions Follow instructions from your health care provider about eating or drinking restrictions. Plan to have someone take you home from the hospital or clinic. If you will be going home right after the procedure, plan to have someone with you for 24 hours. Ask your health care provider what steps will be taken to prevent infection. What happens during the procedure?  An IV will be inserted into one of your veins. You will be given a medicine to help you relax (sedative). The skin on your neck or groin will be numbed. An incision will be made in your neck or your groin. A needle will be inserted through the incision and into a large vein in your neck or groin. A catheter will  be inserted into the needle and moved to your heart. Dye may be injected through the catheter to help your surgeon see the area of the  heart that needs treatment. Electrical currents will be sent from the catheter to ablate heart tissue in desired areas. There are three types of energy that may be used to do this: Heat (radiofrequency energy). Laser energy. Extreme cold (cryoablation). When the tissue has been ablated, the catheter will be removed. Pressure will be held on the insertion area to prevent a lot of bleeding. A bandage (dressing) will be placed over the insertion area. The exact procedure may vary among health care providers and hospitals. What happens after the procedure? Your blood pressure, heart rate, breathing rate, and blood oxygen level will be monitored until you leave the hospital or clinic. Your insertion area will be monitored for bleeding. You will need to lie still for a few hours to ensure that you do not bleed from the insertion area. Do not drive for 24 hours or as long as told by your health care provider. Summary Cardiac ablation is a procedure to destroy, or ablate, a small amount of heart tissue using an electrical current. This procedure can improve the heart rhythm or return it to normal. Tell your health care provider about any medical conditions you may have and all medicines you are taking to treat them. This is a safe procedure, but problems may occur. Problems may include infection, bruising, damage to nearby organs or structures, or allergic reactions to medicines. Follow your health care provider's instructions about eating and drinking before the procedure. You may also be told to change or stop some of your medicines. After the procedure, do not drive for 24 hours or as long as told by your health care provider. This information is not intended to replace advice given to you by your health care provider. Make sure you discuss any questions you have with your health care provider. Document Revised: 04/21/2019 Document Reviewed: 04/21/2019 Elsevier Patient Education  [employer].

## 2021-04-20 NOTE — Assessment & Plan Note (Signed)
Reviewed with Dr Linna Hoff will see her today

## 2021-04-20 NOTE — Progress Notes (Signed)
HPI Denise Henson is referred by Dr. Silvio Pate for evaluation of SVT. She is a pleasant 75 yo woman with a h/o CAD, s/p CABG. She has HTN. She has minimal palpitations. She was seen in Dr. Alla German office today for a screening physical when she was noted to be in SVT. She has a long RP tachycardia. She notes Covid 4 weeks ago. She has felt poorly since then.  Allergies  Allergen Reactions   Lisinopril     cough   Codeine Anxiety    jittery    Morphine Anxiety    jittery     Current Outpatient Medications  Medication Sig Dispense Refill   azithromycin (ZITHROMAX) 250 MG tablet Take 2 tablets on day 1, then 1 tablet daily on days 2 through 5 6 tablet 0   benzonatate (TESSALON) 200 MG capsule Take 1 capsule (200 mg total) by mouth 3 (three) times daily as needed for cough. 60 capsule 0   Calcium Carb-Cholecalciferol 500-400 MG-UNIT TABS Take 1 tablet by mouth daily.     Ferrous Sulfate (IRON PO) Take 35 mg by mouth every other day.      Glucosamine 500 MG CAPS Take 500 mg by mouth 2 (two) times daily.     losartan (COZAAR) 50 MG tablet Take 1 tablet (50 mg total) by mouth daily. 90 tablet 3   Multiple Vitamin (MULTIVITAMIN PO) Take by mouth.     Omega-3 Fatty Acids (FISH OIL) 1000 MG CAPS Take 1,000 mg by mouth 2 (two) times daily.     omeprazole (PRILOSEC) 20 MG capsule Take 1 capsule (20 mg total) by mouth daily. 90 capsule 3   Turmeric (QC TUMERIC COMPLEX PO) Take 1 capsule by mouth 2 (two) times daily.     No current facility-administered medications for this visit.     Past Medical History:  Diagnosis Date   Allergic rhinitis due to pollen    Allergy    Anemia    Aortic insufficiency 10/08   Mild; echocardiogram showed EF 55-65% with mild AI   Arthritis    Asthma    as a child   Coronary artery disease    One-vessel; s/p LIMA-LAD, [facility] 2007   GERD (gastroesophageal reflux disease)    Hx of pleural effusion 2007   after CABG   Hyperlipidemia    Unable  to tolerate simvastatin 40 due to myalgias   Hypertension     ROS:   All systems reviewed and negative except as noted in the HPI.   Past Surgical History:  Procedure Laterality Date   CESAREAN SECTION     CHOLECYSTECTOMY     COLONOSCOPY     CORONARY ARTERY BYPASS GRAFT  5/07   Readmitted for post op pleural effusions   DOPPLER ECHOCARDIOGRAPHY     NV LV EF 1+ AI/MR   EXCISION METACARPAL MASS Left 02/21/2018   Procedure: EXCISION METACARPAL MASS;  Surgeon: Hessie Knows, MD;  Location: [facility] ORS;  Service: Orthopedics;  Laterality: Left;   Stress imaging  6/07   Negative EF 67%   TONSILLECTOMY       Family History  Problem Relation Age of Onset   Heart attack Mother 8   Heart attack Maternal Grandmother    Cancer Neg Hx        Breast or colon   Diabetes Neg Hx    Hypertension Neg Hx    Colon cancer Neg Hx    Colon polyps Neg Hx  Esophageal cancer Neg Hx    Rectal cancer Neg Hx    Stomach cancer Neg Hx      Social History   Socioeconomic History   Marital status: Widowed    Spouse name: Not on file   Number of children: 1   Years of education: Not on file   Highest education level: Not on file  Occupational History   Occupation: Pharmacist, 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[place])    Employer: RETIRED    Comment: Retired  Tobacco Use   Smoking status: Former    Types: Cigarettes    Quit date: 06/26/1994    Years since quitting: 26.8   Smokeless tobacco: Never  Vaping Use   Vaping Use: Never used  Substance and Sexual Activity   Alcohol use: Yes    Comment: one daily   Drug use: No   Sexual activity: Never  Other Topics Concern   Not on file  Social History Narrative   Has living will   Son [name], is health care POA   Would accept resuscitation attempts   No tube feeds if cognitively unaware   Social Determinants of Health   Financial Resource Strain: Not on file  Food Insecurity: Not on file  Transportation Needs: Not on file  Physical Activity: Not on file   Stress: Not on file  Social Connections: Not on file  Intimate Partner Violence: Not on file     BP (!) 154/84   Pulse (!) 140   Ht 5\' 4"  (1.626 m)   Wt 191 lb (86.6 kg)   SpO2 98%   BMI 32.79 kg/m   Physical Exam:  Well appearing NAD HEENT: Unremarkable Neck:  No JVD, no thyromegally Lymphatics:  No adenopathy Back:  No CVA tenderness Lungs:  Clear with no wheezes HEART:  Regular rate rhythm, no murmurs, no rubs, no clicks Abd:  soft, positive bowel sounds, no organomegally, no rebound, no guarding Ext:  2 plus pulses, no edema, no cyanosis, no clubbing Skin:  No rashes no nodules Neuro:  CN II through XII intact, motor grossly intact  EKG - reviewed. Long RP tachy at 140/min  Assess/Plan:  SVT - she is minimally symptomatic but is certainly at risk for the development of a tachy induced CM. I discussed the treatment options including medical therapy with a  beta blocker. My experience is that beta blockers rare do much to prevent/alleviate these types of incessant SVT's. I discussed the indications/risks/benefits/goals/expectations of EP study and catheter ablation and she is willing to proceed. We will plan to schedule early next week. CAD - she is s/p CABG. She does not have any anginal symptoms.  HTN - we will work on this after her ablation depending on her heart rate.  Carleene Overlie Favour Aleshire,MD

## 2021-04-20 NOTE — Assessment & Plan Note (Signed)
I have personally reviewed the Medicare Annual Wellness questionnaire and have noted 1. The patient's medical and social history 2. Their use of alcohol, tobacco or illicit drugs 3. Their current medications and supplements 4. The patient's functional ability including ADL's, fall risks, home safety risks and hearing or visual             impairment. 5. Diet and physical activities 6. Evidence for depression or mood disorders  The patients weight, height, BMI and visual acuity have been recorded in the chart I have made referrals, counseling and provided education to the patient based review of the above and I have provided the pt with a written personalized care plan for preventive services.  I have provided you with a copy of your personalized plan for preventive services. Please take the time to review along with your updated medication list.  Generally healthy Had bivalent COVID and flu vaccines Done with colon cancer screening  Did have mammogram last year--normal Exercises regularly

## 2021-04-20 NOTE — Assessment & Plan Note (Signed)
Almost back to normal now Is on ARB

## 2021-04-20 NOTE — Progress Notes (Signed)
Subjective:    Patient ID: Denise Henson, female    DOB: Jul 05, 1945, 75 y.o.   MRN: [id number]  HPI Here for Medicare wellness visit and follow up of chronic health conditions This visit occurred during the SARS-CoV-2 public health emergency.  Safety protocols were in place, including screening questions prior to the visit, additional usage of staff PPE, and extensive cleaning of exam room while observing appropriate contact time as indicated for disinfecting solutions.   Reviewed advanced directives Reviewed other doctors---[facility], [facility] opt[facility] Still enjoys her regular scotch No tobacco No hospitalizations or surgery in the past year Vision is good Hearing is fine Exercises regularly Had a fall on a cruise--tripped over sill. Got laceration on arm. No depression or anhedonia Independent with instrumental ADLs No sig memory issues---some "foggy brain" since [place]  She has been aware of her heart beating for a few weeks--since COVID Walks some--but still "not feeling super" Does all her shopping, iADLs, etc--slight SOB (but no problems with stairs) No chest pain No dizziness ---but balance is not great now  Still some cough since COVID---some bother with that  Heading to [place] next month Concerned about traveler's diarrhea Discussed that treatment is not recommended unless it is severe (will give azithromycin for emergency use)  Still on losartan Recent GFR up to 59  Known aortic atherosclerosis on imaging  No heartburn on the omeprazole No dysphagia  Current Outpatient Medications on File Prior to Visit  Medication Sig Dispense Refill   Calcium Carb-Cholecalciferol 500-400 MG-UNIT TABS Take 1 tablet by mouth daily.     Ferrous Sulfate (IRON PO) Take 35 mg by mouth every other day.      Glucosamine 500 MG CAPS Take 500 mg by mouth 2 (two) times daily.     losartan (COZAAR) 50 MG tablet Take 1 tablet (50 mg total) by mouth daily. 90  tablet 3   Multiple Vitamin (MULTIVITAMIN PO) Take by mouth.     Omega-3 Fatty Acids (FISH OIL) 1000 MG CAPS Take 1,000 mg by mouth 2 (two) times daily.     omeprazole (PRILOSEC) 20 MG capsule Take 1 capsule (20 mg total) by mouth daily. 90 capsule 3   Turmeric (QC TUMERIC COMPLEX PO) Take 1 capsule by mouth 2 (two) times daily.     No current facility-administered medications on file prior to visit.    Allergies  Allergen Reactions   Lisinopril     cough   Codeine Anxiety    jittery    Morphine Anxiety    jittery    Past Medical History:  Diagnosis Date   Allergic rhinitis due to pollen    Allergy    Anemia    Aortic insufficiency 10/08   Mild; echocardiogram showed EF 55-65% with mild AI   Arthritis    Asthma    as a child   Coronary artery disease    One-vessel; s/p [facility]A-LAD, [facility] 2007   GERD (gastroesophageal reflux disease)    Hx of pleural effusion 2007   after CABG   Hyperlipidemia    Unable to tolerate simvastatin 40 due to myalgias   Hypertension     Past Surgical History:  Procedure Laterality Date   CESAREAN SECTION     CHOLECYSTECTOMY     COLONOSCOPY     CORONARY ARTERY BYPASS GRAFT  5/07   Readmitted for post op pleural effusions   DOPPLER ECHOCARDIOGRAPHY     NV LV EF 1+ AI/MR   EXCISION METACARPAL  MASS Left 02/21/2018   Procedure: EXCISION METACARPAL MASS;  Surgeon: Hessie Knows, MD;  Location: [facility] ORS;  Service: Orthopedics;  Laterality: Left;   Stress imaging  6/07   Negative EF 67%   TONSILLECTOMY      Family History  Problem Relation Age of Onset   Heart attack Mother 92   Heart attack Maternal Grandmother    Cancer Neg Hx        Breast or colon   Diabetes Neg Hx    Hypertension Neg Hx    Colon cancer Neg Hx    Colon polyps Neg Hx    Esophageal cancer Neg Hx    Rectal cancer Neg Hx    Stomach cancer Neg Hx     Social History   Socioeconomic History   Marital status: Widowed    Spouse name: Not on file   Number  of children: 1   Years of education: Not on file   Highest education level: Not on file  Occupational History   Occupation: Pharmacist, 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[place])    Employer: RETIRED    Comment: Retired  Tobacco Use   Smoking status: Former    Types: Cigarettes    Quit date: 06/26/1994    Years since quitting: 26.8   Smokeless tobacco: Never  Vaping Use   Vaping Use: Never used  Substance and Sexual Activity   Alcohol use: Yes    Comment: one daily   Drug use: No   Sexual activity: Never  Other Topics Concern   Not on file  Social History Narrative   Has living will   Son [name], is health care POA   Would accept resuscitation attempts   No tube feeds if cognitively unaware   Social Determinants of Health   Financial Resource Strain: Not on file  Food Insecurity: Not on file  Transportation Needs: Not on file  Physical Activity: Not on file  Stress: Not on file  Social Connections: Not on file  Intimate Partner Violence: Not on file   Review of Systems Appetite is good Weight is stable--lost 5# in [place] with lots of walking recently Sleeps okay other than with current cough Wears seat belt Teeth are okay---keeps up with dentist No suspicious skin lesions Bowels move fine No dysuria. Does have urgency--wears a pad but rarely causes incontinence No sig back or joint pains    Objective:   Physical Exam Constitutional:      Appearance: Normal appearance.  HENT:     Mouth/Throat:     Comments: No lesions Eyes:     Conjunctiva/sclera: Conjunctivae normal.     Pupils: Pupils are equal, round, and reactive to light.  Cardiovascular:     Rate and Rhythm: Regular rhythm. Tachycardia present.     Pulses: Normal pulses.     Heart sounds: No murmur heard.   No gallop.  Pulmonary:     Effort: Pulmonary effort is normal.     Breath sounds: Normal breath sounds. No wheezing or rales.  Abdominal:     Palpations: Abdomen is soft.     Tenderness: There is no abdominal  tenderness.  Musculoskeletal:     Cervical back: Neck supple.  Lymphadenopathy:     Cervical: No cervical adenopathy.  Skin:    Findings: No lesion or rash.  Neurological:     Mental Status: She is alert and oriented to person, place, and time.     Comments: President---"Joseph Biden, Trump,Barack Obama" [phone] D-[id number] Recall 3/3  Psychiatric:  Mood and Affect: Mood normal.        Behavior: Behavior normal.           Assessment & Plan:

## 2021-04-20 NOTE — Telephone Encounter (Signed)
Pt scheduled to see GT today 04/20/2021 at 4:00 pm.  Pt aware.

## 2021-04-20 NOTE — Progress Notes (Signed)
Hearing Screening - Comments:: Unable to obtain audiometry Vision Screening - Comments:: January 2022

## 2021-04-20 NOTE — Telephone Encounter (Signed)
Consult request received from Dr. Silvio Pate.

## 2021-04-25 ENCOUNTER — Encounter (HOSPITAL_COMMUNITY)
Admission: RE | Disposition: A | Discharge status: Home / Self Care | Payer: Self-pay | Source: Home / Self Care | Attending: Internal Medicine

## 2021-04-25 ENCOUNTER — Other Ambulatory Visit: Payer: Self-pay

## 2021-04-25 ENCOUNTER — Inpatient Hospital Stay (HOSPITAL_COMMUNITY)
Admission: RE | Admit: 2021-04-25 | Discharge: 2021-04-28 | DRG: 274 | Disposition: A | Discharge status: Home / Self Care | Payer: Medicare Other | Attending: Internal Medicine | Admitting: Internal Medicine

## 2021-04-25 DIAGNOSIS — I471 Supraventricular tachycardia: Secondary | ICD-10-CM | POA: Diagnosis present

## 2021-04-25 DIAGNOSIS — Z87891 Personal history of nicotine dependence: Secondary | ICD-10-CM

## 2021-04-25 DIAGNOSIS — Z8616 Personal history of COVID-19: Secondary | ICD-10-CM

## 2021-04-25 DIAGNOSIS — Z885 Allergy status to narcotic agent status: Secondary | ICD-10-CM | POA: Diagnosis not present

## 2021-04-25 DIAGNOSIS — N1831 Chronic kidney disease, stage 3a: Secondary | ICD-10-CM | POA: Diagnosis not present

## 2021-04-25 DIAGNOSIS — Z20822 Contact with and (suspected) exposure to covid-19: Secondary | ICD-10-CM | POA: Diagnosis not present

## 2021-04-25 DIAGNOSIS — I4892 Unspecified atrial flutter: Secondary | ICD-10-CM | POA: Diagnosis present

## 2021-04-25 DIAGNOSIS — E785 Hyperlipidemia, unspecified: Secondary | ICD-10-CM | POA: Diagnosis present

## 2021-04-25 DIAGNOSIS — Z79899 Other long term (current) drug therapy: Secondary | ICD-10-CM | POA: Diagnosis not present

## 2021-04-25 DIAGNOSIS — R000 Tachycardia, unspecified: Secondary | ICD-10-CM | POA: Diagnosis not present

## 2021-04-25 DIAGNOSIS — Z888 Allergy status to other drugs, medicaments and biological substances status: Secondary | ICD-10-CM | POA: Diagnosis not present

## 2021-04-25 DIAGNOSIS — D631 Anemia in chronic kidney disease: Secondary | ICD-10-CM | POA: Diagnosis not present

## 2021-04-25 DIAGNOSIS — I4589 Other specified conduction disorders: Secondary | ICD-10-CM | POA: Diagnosis present

## 2021-04-25 DIAGNOSIS — I129 Hypertensive chronic kidney disease with stage 1 through stage 4 chronic kidney disease, or unspecified chronic kidney disease: Secondary | ICD-10-CM | POA: Diagnosis not present

## 2021-04-25 DIAGNOSIS — Z951 Presence of aortocoronary bypass graft: Secondary | ICD-10-CM

## 2021-04-25 DIAGNOSIS — I2510 Atherosclerotic heart disease of native coronary artery without angina pectoris: Secondary | ICD-10-CM | POA: Diagnosis not present

## 2021-04-25 DIAGNOSIS — I484 Atypical atrial flutter: Principal | ICD-10-CM | POA: Diagnosis present

## 2021-04-25 DIAGNOSIS — J385 Laryngeal spasm: Secondary | ICD-10-CM | POA: Diagnosis not present

## 2021-04-25 DIAGNOSIS — M1990 Unspecified osteoarthritis, unspecified site: Secondary | ICD-10-CM | POA: Diagnosis not present

## 2021-04-25 DIAGNOSIS — U071 COVID-19: Secondary | ICD-10-CM | POA: Diagnosis not present

## 2021-04-25 DIAGNOSIS — Z8249 Family history of ischemic heart disease and other diseases of the circulatory system: Secondary | ICD-10-CM

## 2021-04-25 DIAGNOSIS — I10 Essential (primary) hypertension: Secondary | ICD-10-CM | POA: Diagnosis present

## 2021-04-25 DIAGNOSIS — R0902 Hypoxemia: Secondary | ICD-10-CM | POA: Diagnosis not present

## 2021-04-25 DIAGNOSIS — K219 Gastro-esophageal reflux disease without esophagitis: Secondary | ICD-10-CM | POA: Diagnosis not present

## 2021-04-25 HISTORY — PX: ATRIAL TACH ABLATION: EP1192

## 2021-04-25 LAB — SARS CORONAVIRUS 2 (TAT 6-24 HRS): SARS Coronavirus 2: POSITIVE — AB

## 2021-04-25 SURGERY — ATRIAL TACH ABLATION
Anesthesia: LOCAL

## 2021-04-25 MED ORDER — APIXABAN 5 MG PO TABS
5.0000 mg | ORAL_TABLET | Freq: Two times a day (BID) | ORAL | Status: DC
  Administered 2021-04-25 – 2021-04-28 (×6): 5 mg via ORAL
  Filled 2021-04-25 (×6): qty 1

## 2021-04-25 MED ORDER — HEPARIN (PORCINE) IN NACL 1000-0.9 UT/500ML-% IV SOLN
INTRAVENOUS | Status: DC | PRN
  Administered 2021-04-25: 500 mL

## 2021-04-25 MED ORDER — BUPIVACAINE HCL (PF) 0.25 % IJ SOLN
INTRAMUSCULAR | Status: AC
  Filled 2021-04-25: qty 60

## 2021-04-25 MED ORDER — MIDAZOLAM HCL 5 MG/5ML IJ SOLN
INTRAMUSCULAR | Status: AC
  Filled 2021-04-25: qty 5

## 2021-04-25 MED ORDER — SODIUM CHLORIDE 0.9% FLUSH: 3.0000 mL | INTRAVENOUS | Status: DC | PRN

## 2021-04-25 MED ORDER — MIDAZOLAM HCL 5 MG/5ML IJ SOLN
INTRAMUSCULAR | Status: DC | PRN
  Administered 2021-04-25: 2 mg via INTRAVENOUS

## 2021-04-25 MED ORDER — SODIUM CHLORIDE 0.9 % IV SOLN: INTRAVENOUS | Status: DC

## 2021-04-25 MED ORDER — ACETAMINOPHEN 325 MG PO TABS: 650.0000 mg | ORAL_TABLET | ORAL | Status: DC | PRN

## 2021-04-25 MED ORDER — BUPIVACAINE HCL (PF) 0.25 % IJ SOLN
INTRAMUSCULAR | Status: DC | PRN
  Administered 2021-04-25: 60 mL

## 2021-04-25 MED ORDER — SODIUM CHLORIDE 0.9 % IV SOLN: 250.0000 mL | INTRAVENOUS | Status: DC | PRN

## 2021-04-25 MED ORDER — DILTIAZEM HCL-DEXTROSE 125-5 MG/125ML-% IV SOLN (PREMIX)
10.0000 mg/h | INTRAVENOUS | Status: DC
  Administered 2021-04-25: 15 mg/h via INTRAVENOUS
  Administered 2021-04-25: 5 mg/h via INTRAVENOUS
  Administered 2021-04-26: 15 mg/h via INTRAVENOUS
  Administered 2021-04-26 – 2021-04-27 (×2): 10 mg/h via INTRAVENOUS
  Filled 2021-04-25 (×5): qty 125

## 2021-04-25 MED ORDER — SODIUM CHLORIDE 0.9% FLUSH
3.0000 mL | Freq: Two times a day (BID) | INTRAVENOUS | Status: DC
  Administered 2021-04-26 – 2021-04-28 (×4): 3 mL via INTRAVENOUS

## 2021-04-25 MED ORDER — ONDANSETRON HCL 4 MG/2ML IJ SOLN: 4.0000 mg | Freq: Four times a day (QID) | INTRAMUSCULAR | Status: DC | PRN

## 2021-04-25 SURGICAL SUPPLY — 10 items
CATH JOSEPH QUAD ALLRED 6F REP (CATHETERS) ×2 IMPLANT
CATH JSN HEX 2-5-2 120 (CATHETERS) ×1 IMPLANT
MAT PREVALON FULL STRYKER (MISCELLANEOUS) ×1 IMPLANT
PACK EP LATEX FREE (CUSTOM PROCEDURE TRAY) ×2
PACK EP LF (CUSTOM PROCEDURE TRAY) ×1 IMPLANT
PAD PRO RADIOLUCENT 2001M-C (PAD) ×2 IMPLANT
PATCH CARTO3 (PAD) ×1 IMPLANT
SHEATH PINNACLE 6F 10CM (SHEATH) ×2 IMPLANT
SHEATH PINNACLE 7F 10CM (SHEATH) ×1 IMPLANT
SHEATH PINNACLE 8F 10CM (SHEATH) ×1 IMPLANT

## 2021-04-25 NOTE — Interval H&P Note (Signed)
History and Physical Interval Note:  04/25/2021 5:20 PM  Denise Henson  has presented today for surgery, with the diagnosis of atrial tach.  The various methods of treatment have been discussed with the patient and family. After consideration of risks, benefits and other options for treatment, the patient has consented to  Procedure(s): ATRIAL TACH ABLATION (N/A) as a surgical intervention.  The patient's history has been reviewed, patient examined, no change in status, stable for surgery.  I have reviewed the patient's chart and labs.  Questions were answered to the patient's satisfaction.     Cristopher Peru  Addendum:  The patient has undergone EP study and found to have left atrial flutter, not atrial tachycardia and will be admitted, started on eliquis, obtain a 2D echo and TEE guided DCCV on Wednesday. Home on Thursday.    Carleene Overlie Mervyn Pflaum,MD

## 2021-04-25 NOTE — Progress Notes (Signed)
Pt states she takes Losartan 50 mg PO at bedtime. MD paged.

## 2021-04-25 NOTE — Progress Notes (Signed)
Site area: rt ij venous sheath Site Prior to Removal:  Level 0 Pressure Applied For: 10 minutes Manual:   yes Patient Status During Pull:  stable Post Pull Site:  Level 0 Post Pull Instructions Given:  yes Post Pull Pulses Present: na Dressing Applied:  petroleum gauze, gauze and tegaderm Bedrest begins @ na Comments:

## 2021-04-25 NOTE — Progress Notes (Signed)
Site area: rt groin fv sheaths x3 Site Prior to Removal:  Level 0 Pressure Applied For: 15 minutes Manual:   yes Patient Status During Pull:  stable Post Pull Site:  Level 0 Post Pull Instructions Given:  yes Post Pull Pulses Present: rt dp palpable Dressing Applied:  gauze and tegaderm Bedrest begins @ 0272 Comments:

## 2021-04-26 ENCOUNTER — Encounter (HOSPITAL_COMMUNITY): Payer: Self-pay | Admitting: Internal Medicine

## 2021-04-26 ENCOUNTER — Other Ambulatory Visit (HOSPITAL_COMMUNITY): Payer: Self-pay

## 2021-04-26 DIAGNOSIS — I484 Atypical atrial flutter: Secondary | ICD-10-CM | POA: Diagnosis not present

## 2021-04-26 LAB — BASIC METABOLIC PANEL
Anion gap: 7 (ref 5–15)
BUN: 11 mg/dL (ref 8–23)
CO2: 23 mmol/L (ref 22–32)
Calcium: 8.9 mg/dL (ref 8.9–10.3)
Chloride: 106 mmol/L (ref 98–111)
Creatinine, Ser: 0.91 mg/dL (ref 0.44–1.00)
GFR, Estimated: >60 mL/min (ref >60)
Glucose, Bld: 109 mg/dL — ABNORMAL HIGH (ref 70–99)
Potassium: 3.9 mmol/L (ref 3.5–5.1)
Sodium: 136 mmol/L (ref 135–145)

## 2021-04-26 MED ORDER — METOPROLOL TARTRATE 25 MG PO TABS
25.0000 mg | ORAL_TABLET | Freq: Two times a day (BID) | ORAL | Status: DC
  Administered 2021-04-26 – 2021-04-28 (×6): 25 mg via ORAL
  Filled 2021-04-26 (×6): qty 1

## 2021-04-26 MED ORDER — METOPROLOL TARTRATE 5 MG/5ML IV SOLN
5.0000 mg | Freq: Once | INTRAVENOUS | Status: AC
  Administered 2021-04-26: 5 mg via INTRAVENOUS
  Filled 2021-04-26: qty 5

## 2021-04-26 NOTE — Plan of Care (Signed)
  Problem: Activity: Goal: Risk for activity intolerance will decrease Outcome: Progressing   Problem: Coping: Goal: Level of anxiety will decrease Outcome: Progressing   Problem: Safety: Goal: Ability to remain free from injury will improve Outcome: Progressing   

## 2021-04-26 NOTE — Discharge Instructions (Signed)

## 2021-04-26 NOTE — Progress Notes (Signed)
Pt remains in A flutter with HR in the 130s while on Cardizem drip at 15 mL/hr. Pt remains hypertensive. Plan for DCCV on Wednesday. MD paged.

## 2021-04-26 NOTE — Progress Notes (Addendum)
Electrophysiology Rounding Note  Patient Name: Denise Henson Date of Encounter: 04/26/2021  Primary Cardiologist: None Electrophysiologist: Dr. Lovena Le   Subjective   The patient is doing well today.  At this time, the patient denies chest pain, shortness of breath, or any new concerns. Frustrated at [employer] precautions as she had symptomatic COVID in September, likely residual.  Inpatient Medications    Scheduled Meds:  apixaban  5 mg Oral BID   metoprolol tartrate  25 mg Oral BID   sodium chloride flush  3 mL Intravenous Q12H   Continuous Infusions:  sodium chloride     diltiazem (CARDIZEM) infusion 15 mg/hr (04/26/21 0418)   PRN Meds: sodium chloride, acetaminophen, ondansetron (ZOFRAN) IV, sodium chloride flush   Vital Signs    Vitals:   04/26/21 0115 04/26/21 0200 04/26/21 0422 04/26/21 0704  BP: 138/85  127/73 109/82  Pulse: (!) 131 97    Resp: 20  16 18   Temp: 98.3 F (36.8 C)  98.2 F (36.8 C) 97.8 F (36.6 C)  TempSrc: Oral  Oral   SpO2: 97%  96% 96%  Weight: 84.9 kg     Height:        Intake/Output Summary (Last 24 hours) at 04/26/2021 0814 Last data filed at 04/26/2021 0418 Gross per 24 hour  Intake 379.58 ml  Output 675 ml  Net -295.42 ml   Filed Weights   04/25/21 0803 04/26/21 0115  Weight: 83.9 kg 84.9 kg    Physical Exam    GEN- The patient is well appearing, alert and oriented x 3 today.   Head- normocephalic, atraumatic Eyes-  Sclera clear, conjunctiva pink Ears- hearing intact Oropharynx- clear Neck- supple Lungs- Clear to ausculation bilaterally, normal work of breathing Heart- Tachy, Regular rate and rhythm, no murmurs, rubs or gallops GI- soft, NT, ND, + BS Extremities- no clubbing or cyanosis. No edema Skin- no rash or lesion Psych- euthymic mood, full affect Neuro- strength and sensation are intact  Labs    CBC No results for input(s): WBC, NEUTROABS, HGB, HCT, MCV, PLT in the last 72 hours. Basic Metabolic  Panel Recent Labs    04/26/21 0342  NA 136  K 3.9  CL 106  CO2 23  GLUCOSE 109*  BUN 11  CREATININE 0.91  CALCIUM 8.9   Liver Function Tests No results for input(s): AST, ALT, ALKPHOS, BILITOT, PROT, ALBUMIN in the last 72 hours. No results for input(s): LIPASE, AMYLASE in the last 72 hours. Cardiac Enzymes No results for input(s): CKTOTAL, CKMB, CKMBINDEX, TROPONINI in the last 72 hours.   Telemetry    Atrial flutter with rates improved overnight on dilt gtt. (personally reviewed)  Radiology    EP STUDY  Result Date: 04/25/2021 Conclusion: Invasive insertion of catheters in the right atrium, left atrium, and right ventricle demonstrating atypical left atrial flutter with 2 1 AV conduction which was not evident at the time of recording of the baseline twelve-lead EKG Salome Spotted    Patient Profile     NAYELY DINGUS is a 75 y.o. female with a past medical history significant for CAD, HTN, and SVT. she was admitted for ablation but EP study revealed a left atrial flutter.   Assessment & Plan    Left atrial flutter Continue dilt gtt for rate control Plan TEE/DCC tomorrow. The risks and benefits of transesophageal echocardiogram have been explained including risks of esophageal damage, perforation (1:10,000 risk), bleeding, pharyngeal hematoma as well as other potential complications associated with conscious  sedation including aspiration, arrhythmia, respiratory failure and death. Alternatives to treatment were discussed, questions were answered. Patient is willing to proceed.  Continue Eliquis for CHA2DS2/VASc of at least 5 given new diagnosis of flutter  2. CAD  Denies s/s ischemia  3. COVID, incidental finding She had symptomatic COVID with + test in September Most likely residual. Per RN, ID is to evaluate need for precautions.   For questions or updates, please contact [place] Please consult [email or web address] for contact info under  Cardiology/STEMI.  Signed, Shirley Friar, PA-C  04/26/2021, 8:14 AM   EP Attending  Patient seen and examined. Agree with above. She is better with her rates under better control. She will undergo TEE guided DCCV tomorrow. I had hoped to get a 2D echo today. She will continue eliquis.  Carleene Overlie Crespin Forstrom,MD

## 2021-04-26 NOTE — TOC Benefit Eligibility Note (Signed)
Patient Teacher, English as a foreign language completed.    The patient is currently admitted and upon discharge could be taking Eliquis 5 mg.  The current 30 day co-pay is, $142.00 due to a $95.00 deductible.   The patient is insured through [place] [place] Patient Advocate Specialist [place] Patient Advocate Team Direct Number: [phone]  Fax: [phone]

## 2021-04-26 NOTE — Progress Notes (Signed)
Mobility Specialist Progress Note:   04/26/21 1616  Mobility  Activity Ambulated in hall  Level of Assistance Standby assist, set-up cues, supervision of patient - no hands on  Assistive Device None  Distance Ambulated (ft) 960 ft  Mobility Ambulated with assistance in hallway  Mobility Response Tolerated well  Mobility performed by Mobility specialist  $Mobility charge 1 Mobility   Pt received in bed willing to participate in mobility. No complaints of pain and asymptomatic. Pt returned to bed  with call bell in reach and all needs met.   [facility] Health and safety inspector Phone [phone]

## 2021-04-26 NOTE — H&P (View-Only) (Signed)
Electrophysiology Rounding Note  Patient Name: Denise Henson Date of Encounter: 04/26/2021  Primary Cardiologist: None Electrophysiologist: Dr. Lovena Le   Subjective   The patient is doing well today.  At this time, the patient denies chest pain, shortness of breath, or any new concerns. Frustrated at [employer] precautions as she had symptomatic COVID in September, likely residual.  Inpatient Medications    Scheduled Meds:  apixaban  5 mg Oral BID   metoprolol tartrate  25 mg Oral BID   sodium chloride flush  3 mL Intravenous Q12H   Continuous Infusions:  sodium chloride     diltiazem (CARDIZEM) infusion 15 mg/hr (04/26/21 0418)   PRN Meds: sodium chloride, acetaminophen, ondansetron (ZOFRAN) IV, sodium chloride flush   Vital Signs    Vitals:   04/26/21 0115 04/26/21 0200 04/26/21 0422 04/26/21 0704  BP: 138/85  127/73 109/82  Pulse: (!) 131 97    Resp: 20  16 18   Temp: 98.3 F (36.8 C)  98.2 F (36.8 C) 97.8 F (36.6 C)  TempSrc: Oral  Oral   SpO2: 97%  96% 96%  Weight: 84.9 kg     Height:        Intake/Output Summary (Last 24 hours) at 04/26/2021 0814 Last data filed at 04/26/2021 0418 Gross per 24 hour  Intake 379.58 ml  Output 675 ml  Net -295.42 ml   Filed Weights   04/25/21 0803 04/26/21 0115  Weight: 83.9 kg 84.9 kg    Physical Exam    GEN- The patient is well appearing, alert and oriented x 3 today.   Head- normocephalic, atraumatic Eyes-  Sclera clear, conjunctiva pink Ears- hearing intact Oropharynx- clear Neck- supple Lungs- Clear to ausculation bilaterally, normal work of breathing Heart- Tachy, Regular rate and rhythm, no murmurs, rubs or gallops GI- soft, NT, ND, + BS Extremities- no clubbing or cyanosis. No edema Skin- no rash or lesion Psych- euthymic mood, full affect Neuro- strength and sensation are intact  Labs    CBC No results for input(s): WBC, NEUTROABS, HGB, HCT, MCV, PLT in the last 72 hours. Basic Metabolic  Panel Recent Labs    04/26/21 0342  NA 136  K 3.9  CL 106  CO2 23  GLUCOSE 109*  BUN 11  CREATININE 0.91  CALCIUM 8.9   Liver Function Tests No results for input(s): AST, ALT, ALKPHOS, BILITOT, PROT, ALBUMIN in the last 72 hours. No results for input(s): LIPASE, AMYLASE in the last 72 hours. Cardiac Enzymes No results for input(s): CKTOTAL, CKMB, CKMBINDEX, TROPONINI in the last 72 hours.   Telemetry    Atrial flutter with rates improved overnight on dilt gtt. (personally reviewed)  Radiology    EP STUDY  Result Date: 04/25/2021 Conclusion: Invasive insertion of catheters in the right atrium, left atrium, and right ventricle demonstrating atypical left atrial flutter with 2 1 AV conduction which was not evident at the time of recording of the baseline twelve-lead EKG Salome Spotted    Patient Profile     Denise Henson is a 75 y.o. female with a past medical history significant for CAD, HTN, and SVT. she was admitted for ablation but EP study revealed a left atrial flutter.   Assessment & Plan    Left atrial flutter Continue dilt gtt for rate control Plan TEE/DCC tomorrow. The risks and benefits of transesophageal echocardiogram have been explained including risks of esophageal damage, perforation (1:10,000 risk), bleeding, pharyngeal hematoma as well as other potential complications associated with conscious  sedation including aspiration, arrhythmia, respiratory failure and death. Alternatives to treatment were discussed, questions were answered. Patient is willing to proceed.  Continue Eliquis for CHA2DS2/VASc of at least 5 given new diagnosis of flutter  2. CAD  Denies s/s ischemia  3. COVID, incidental finding She had symptomatic COVID with + test in September Most likely residual. Per RN, ID is to evaluate need for precautions.   For questions or updates, please contact [place] Please consult [email or web address] for contact info under  Cardiology/STEMI.  Signed, Shirley Friar, PA-C  04/26/2021, 8:14 AM   EP Attending  Patient seen and examined. Agree with above. She is better with her rates under better control. She will undergo TEE guided DCCV tomorrow. I had hoped to get a 2D echo today. She will continue eliquis.  Carleene Overlie Dajiah Kooi,MD

## 2021-04-27 ENCOUNTER — Inpatient Hospital Stay (HOSPITAL_COMMUNITY): Payer: Medicare Other | Admitting: Certified Registered"

## 2021-04-27 ENCOUNTER — Inpatient Hospital Stay (HOSPITAL_COMMUNITY): Payer: Medicare Other

## 2021-04-27 ENCOUNTER — Encounter (HOSPITAL_COMMUNITY): Payer: Self-pay | Admitting: Internal Medicine

## 2021-04-27 ENCOUNTER — Encounter (HOSPITAL_COMMUNITY)
Admission: RE | Disposition: A | Discharge status: Home / Self Care | Payer: Self-pay | Source: Home / Self Care | Attending: Internal Medicine

## 2021-04-27 DIAGNOSIS — U071 COVID-19: Secondary | ICD-10-CM

## 2021-04-27 DIAGNOSIS — I484 Atypical atrial flutter: Secondary | ICD-10-CM

## 2021-04-27 DIAGNOSIS — I4892 Unspecified atrial flutter: Secondary | ICD-10-CM

## 2021-04-27 DIAGNOSIS — I2510 Atherosclerotic heart disease of native coronary artery without angina pectoris: Secondary | ICD-10-CM

## 2021-04-27 HISTORY — PX: TEE WITHOUT CARDIOVERSION: SHX5443

## 2021-04-27 HISTORY — PX: CARDIOVERSION: SHX1299

## 2021-04-27 LAB — ECHOCARDIOGRAM COMPLETE
Area-P 1/2: 3.28 cm2
Height: 64 in
S' Lateral: 3.7 cm
Weight: 2991.2 oz

## 2021-04-27 LAB — PROTIME-INR
INR: 1.1 (ref 0.8–1.2)
Prothrombin Time: 14.5 seconds (ref 11.4–15.2)

## 2021-04-27 SURGERY — ECHOCARDIOGRAM, TRANSESOPHAGEAL
Anesthesia: Monitor Anesthesia Care

## 2021-04-27 MED ORDER — PROPOFOL 500 MG/50ML IV EMUL
INTRAVENOUS | Status: DC | PRN
Start: 2021-04-27 — End: 2021-04-27
  Administered 2021-04-27: 125 ug/kg/min via INTRAVENOUS

## 2021-04-27 MED ORDER — LIDOCAINE 2% (20 MG/ML) 5 ML SYRINGE
INTRAMUSCULAR | Status: DC | PRN
  Administered 2021-04-27: 100 mg via INTRAVENOUS

## 2021-04-27 MED ORDER — PROPOFOL 10 MG/ML IV BOLUS
INTRAVENOUS | Status: DC | PRN
  Administered 2021-04-27 (×2): 20 mg via INTRAVENOUS

## 2021-04-27 MED ORDER — SODIUM CHLORIDE 0.9 % IV SOLN
INTRAVENOUS | Status: AC | PRN
  Administered 2021-04-27: 500 mL via INTRAVENOUS

## 2021-04-27 NOTE — Op Note (Signed)
INDICATIONS: Atypical atrial flutter precardioversion.  PROCEDURE:   Informed consent was obtained prior to the procedure. The risks, benefits and alternatives for the procedure were discussed and the patient comprehended these risks.  Risks include, but are not limited to, cough, sore throat, vomiting, nausea, somnolence, esophageal and stomach trauma or perforation, bleeding, low blood pressure, aspiration, pneumonia, infection, trauma to the teeth and death.    After a procedural time-out, the oropharynx was anesthetized with 20% benzocaine spray.   During this procedure the patient was administered IV propofol by Anesthesiology.  The transesophageal probe was inserted in the esophagus and stomach without difficulty and multiple views were obtained, but the procedure was abbreviated due to hypoxemia..  The patient was kept under observation until the patient left the procedure room.  The patient left the procedure room in stable condition.   Agitated microbubble saline contrast was not administered.  COMPLICATIONS:    The patient developed hypoventilation and hypoxia (laryngospasm?) shortly after sedation and esophageal intubation. The TEE procedure was limited to detailed evaluation for left atrial thrombus (none seen), with very limited information obtained about other cardiac structures. The TEE probe was removed and ventilation bag-valve mask led to rapid resolution of hypoxemia. Cardioversion was preformed. There were no other immediate complications.  FINDINGS:  No left atrial thrombus, but there is spontaneous echo contrast. Both the right and the left ventricle have depressed systolic function.  RECOMMENDATIONS:    Proceed with cardioversion.  Get additional information from transthoracic echo (ordered).  Time Spent Directly with the Patient:  45 minutes   Denise Henson 04/27/2021, 10:35 AM

## 2021-04-27 NOTE — Anesthesia Procedure Notes (Signed)
Procedure Name: MAC Date/Time: 04/27/2021 10:16 AM Performed by: Amadeo Garnet, CRNA Pre-anesthesia Checklist: Patient identified, Suction available, Emergency Drugs available and Patient being monitored Patient Re-evaluated:Patient Re-evaluated prior to induction Oxygen Delivery Method: Nasal cannula Preoxygenation: Pre-oxygenation with 100% oxygen Induction Type: IV induction Placement Confirmation: positive ETCO2 Dental Injury: Teeth and Oropharynx as per pre-operative assessment

## 2021-04-27 NOTE — Interval H&P Note (Signed)
History and Physical Interval Note:  04/27/2021 8:34 AM  Denise Henson  has presented today for surgery, with the diagnosis of atrial flutter.  The various methods of treatment have been discussed with the patient and family. After consideration of risks, benefits and other options for treatment, the patient has consented to  Procedure(s): TRANSESOPHAGEAL ECHOCARDIOGRAM (TEE) (N/A) CARDIOVERSION (N/A) as a surgical intervention.  The patient's history has been reviewed, patient examined, no change in status, stable for surgery.  I have reviewed the patient's chart and labs.  Questions were answered to the patient's satisfaction.     [name]

## 2021-04-27 NOTE — Progress Notes (Signed)
Mobility Specialist Progress Note:   04/27/21 1500  Mobility  Activity Ambulated in hall  Level of Assistance Standby assist, set-up cues, supervision of patient - no hands on  Assistive Device None  Distance Ambulated (ft) 1400 ft  Mobility Ambulated with assistance in hallway  Mobility Response Tolerated well  Mobility performed by Mobility specialist  $Mobility charge 1 Mobility   Pt received EOB willing to participate in mobility. No complaints of pain and asymptomatic throughout ambulation. Pt returned to EOB with call bell in reach and all needs met.   [facility] Health and safety inspector Phone [phone]

## 2021-04-27 NOTE — Transfer of Care (Addendum)
Immediate Anesthesia Transfer of Care Note  Patient: Denise Henson  Procedure(s) Performed: TRANSESOPHAGEAL ECHOCARDIOGRAM (TEE) CARDIOVERSION  Patient Location: Endoscopy Unit  Anesthesia Type:MAC  Level of Consciousness: drowsy and patient cooperative  Airway & Oxygen Therapy: Patient Spontanous Breathing and Patient connected to nasal cannula oxygen  Post-op Assessment: Report given to RN, Post -op Vital signs reviewed and stable and Patient moving all extremities  Post vital signs: Reviewed and stable  Last Vitals:  Vitals Value Taken Time  BP 99/56 04/27/21 1041  Temp 36.3 C 04/27/21 1041  Pulse 54 04/27/21 1045  Resp 19 04/27/21 1045  SpO2 98 % 04/27/21 1045  Vitals shown include unvalidated device data.  Last Pain:  Vitals:   04/27/21 1041  TempSrc: Temporal  PainSc: 0-No pain         Complications: No notable events documented.

## 2021-04-27 NOTE — Op Note (Signed)
Procedure: Electrical Cardioversion Indications:  Atrial Flutter  Procedure Details:  Consent: Risks of procedure as well as the alternatives and risks of each were explained to the (patient/caregiver).  Consent for procedure obtained.  Time Out: Verified patient identification, verified procedure, site/side was marked, verified correct patient position, special equipment/implants available, medications/allergies/relevent history reviewed, required imaging and test results available.  Performed  Patient placed on cardiac monitor, pulse oximetry, supplemental oxygen as necessary.  Sedation given:  IV propofol, Anesthesiology, Dr. Ambrose Pancoast Pacer pads placed anterior and posterior chest.  Cardioverted 1 time(s).  Cardioversion with synchronized biphasic 150J shock.  Evaluation: Findings: Post procedure EKG shows: NSR Complications: None Patient did tolerate procedure well.  Time Spent Directly with the Patient:  20 minutes   Denise Henson 04/27/2021, 10:45 AM

## 2021-04-27 NOTE — Anesthesia Postprocedure Evaluation (Signed)
Anesthesia Post Note  Patient: Denise Henson  Procedure(s) Performed: TRANSESOPHAGEAL ECHOCARDIOGRAM (TEE) CARDIOVERSION     Patient location during evaluation: PACU Anesthesia Type: MAC Level of consciousness: awake and alert Pain management: pain level controlled Vital Signs Assessment: post-procedure vital signs reviewed and stable Respiratory status: spontaneous breathing, nonlabored ventilation, respiratory function stable and patient connected to nasal cannula oxygen Cardiovascular status: stable and blood pressure returned to baseline Postop Assessment: no apparent nausea or vomiting Anesthetic complications: no   No notable events documented.  Last Vitals:  Vitals:   04/27/21 1051 04/27/21 1101  BP: (!) 97/56 98/63  Pulse: (!) 52 (!) 57  Resp: 20 16  Temp:    SpO2: 99% 97%    Last Pain:  Vitals:   04/27/21 1101  TempSrc:   PainSc: 0-No pain                 Adhira Jamil

## 2021-04-27 NOTE — Plan of Care (Signed)
  Problem: Clinical Measurements: Goal: Diagnostic test results will improve Outcome: Progressing   

## 2021-04-27 NOTE — Plan of Care (Signed)

## 2021-04-27 NOTE — Progress Notes (Addendum)
Electrophysiology Rounding Note  Patient Name: Denise Henson Date of Encounter: 04/27/2021  Primary Cardiologist: None Electrophysiologist: Dr. Lovena Le   Subjective   The patient is doing well today.  At this time, the patient denies chest pain, shortness of breath, or any new concerns.  Inpatient Medications    Scheduled Meds:  apixaban  5 mg Oral BID   metoprolol tartrate  25 mg Oral BID   sodium chloride flush  3 mL Intravenous Q12H   Continuous Infusions:  sodium chloride     diltiazem (CARDIZEM) infusion 10 mg/hr (04/27/21 0233)   PRN Meds: sodium chloride, acetaminophen, ondansetron (ZOFRAN) IV, sodium chloride flush   Vital Signs    Vitals:   04/26/21 2146 04/27/21 0017 04/27/21 0424 04/27/21 0635  BP: (!) 145/98 (!) 110/92 124/89   Pulse: (!) 129 (!) 50 (!) 128   Resp: 20 16 16    Temp: 98 F (36.7 C) 98.3 F (36.8 C) 98 F (36.7 C)   TempSrc:  Oral Oral   SpO2:  96% 96%   Weight:    84.8 kg  Height:        Intake/Output Summary (Last 24 hours) at 04/27/2021 0838 Last data filed at 04/26/2021 2155 Gross per 24 hour  Intake 800 ml  Output 1351 ml  Net -551 ml   Filed Weights   04/25/21 0803 04/26/21 0115 04/27/21 0635  Weight: 83.9 kg 84.9 kg 84.8 kg    Physical Exam    GEN- The patient is well appearing, alert and oriented x 3 today.   Head- normocephalic, atraumatic Eyes-  Sclera clear, conjunctiva pink Ears- hearing intact Oropharynx- clear Neck- supple Lungs- Clear to ausculation bilaterally, normal work of breathing Heart-  Tachy and regular (flutter)  rate and rhythm, no murmurs, rubs or gallops GI- soft, NT, ND, + BS Extremities- no clubbing or cyanosis. No edema Skin- no rash or lesion Psych- euthymic mood, full affect Neuro- strength and sensation are intact  Labs    CBC No results for input(s): WBC, NEUTROABS, HGB, HCT, MCV, PLT in the last 72 hours. Basic Metabolic Panel Recent Labs    04/26/21 0342  NA 136  K 3.9   CL 106  CO2 23  GLUCOSE 109*  BUN 11  CREATININE 0.91  CALCIUM 8.9   Liver Function Tests No results for input(s): AST, ALT, ALKPHOS, BILITOT, PROT, ALBUMIN in the last 72 hours. No results for input(s): LIPASE, AMYLASE in the last 72 hours. Cardiac Enzymes No results for input(s): CKTOTAL, CKMB, CKMBINDEX, TROPONINI in the last 72 hours.   Telemetry    Atrial flutter 110-130s mostly (personally reviewed)  Radiology    EP STUDY  Result Date: 04/25/2021 Conclusion: Invasive insertion of catheters in the right atrium, left atrium, and right ventricle demonstrating atypical left atrial flutter with 2 1 AV conduction which was not evident at the time of recording of the baseline twelve-lead EKG Salome Spotted    Patient Profile     Denise Henson is a 75 y.o. female with a past medical history significant for CAD, HTN, and SVT. she was admitted for ablation but EP study revealed a left atrial flutter.   Assessment & Plan    Left atrial flutter Continue dilt gtt for rate control Plan TEE/DCC this morning. . The risks and benefits of transesophageal echocardiogram have been explained including risks of esophageal damage, perforation (1:10,000 risk), bleeding, pharyngeal hematoma as well as other potential complications associated with conscious sedation including aspiration, arrhythmia,  respiratory failure and death. Alternatives to treatment were discussed, questions were answered. Patient is willing to proceed.  Continue Eliquis for CHA2DS2/VASc of at least 5 given new diagnosis of flutter Consider 2D echo if not enough data from TEE re: EF.   2. CAD  No s/s ischemia   3. COVID, incidental finding She had symptomatic COVID with + test in September This is felt to be residual from 02/2021. She is NOT symptomatic and is NOT on precautions.   For questions or updates, please contact [place] Please consult [email or web address] for contact info under  Cardiology/STEMI.  Signed, Shirley Friar, PA-C  04/27/2021, 8:38 AM   I have seen and examined this patient with Oda Kilts.  Agree with above, note added to reflect my findings.  On exam, regular, tachycardic, no murmurs.  Patient remains in left atrial flutter.  We Ketura Sirek plan for TEE and cardioversion today.  She Jeshurun [place] also need a transthoracic echo to get an accurate determination of her ejection fraction.  If she remains in sinus rhythm throughout the night, we Yehudah Standing likely plan for discharge tomorrow.  Perel Hauschild M. Lenzie Montesano MD 04/27/2021 1:23 PM

## 2021-04-27 NOTE — Progress Notes (Signed)
Echocardiogram 2D Echocardiogram has been performed.  Oneal Deputy Wenda Vanschaick RDCS 04/27/2021, 12:35 PM

## 2021-04-27 NOTE — Anesthesia Preprocedure Evaluation (Addendum)
Anesthesia Evaluation  Patient identified by MRN, date of birth, ID band Patient awake    Reviewed: Allergy & Precautions, H&P , NPO status , Patient's Chart, lab work & pertinent test results  History of Anesthesia Complications Negative for: history of anesthetic complications  Airway Mallampati: III  TM Distance: <3 FB Neck ROM: limited    Dental no notable dental hx. (+) Teeth Intact, Dental Advisory Given, Caps   Pulmonary neg shortness of breath, asthma , former smoker,    Pulmonary exam normal breath sounds clear to auscultation       Cardiovascular Exercise Tolerance: Good hypertension, (-) angina+ CAD, + CABG and + Peripheral Vascular Disease  (-) Past MI and (-) DOE Normal cardiovascular exam Rhythm:Regular Rate:Normal     Neuro/Psych  Neuromuscular disease negative psych ROS   GI/Hepatic Neg liver ROS, GERD  Medicated and Controlled,  Endo/Other  negative endocrine ROS  Renal/GU      Musculoskeletal  (+) Arthritis ,   Abdominal   Peds  Hematology negative hematology ROS (+)   Anesthesia Other Findings Past Medical History:  10/08: Aortic insufficiency     Comment:  Mild; echocardiogram showed EF 55-65% with mild AI  Coronary artery disease     Comment:  One-vessel; s/p LIMA-LAD, [facility] 2007  GERD (gastroesophageal reflux disease)   Hypertension  Past Surgical History:  CESAREAN SECTION  CHOLECYSTECTOMY 5/07: CORONARY ARTERY BYPASS GRAFT            Reproductive/Obstetrics negative OB ROS                            Anesthesia Physical  Anesthesia Plan  ASA: 3  Anesthesia Plan: MAC   Post-op Pain Management:    Induction: Intravenous  PONV Risk Score and Plan: 2 and Treatment may vary due to age or medical condition  Airway Management Planned: Natural Airway, Nasal Cannula, Simple Face Mask and Mask  Additional Equipment:   Intra-op Plan:    Post-operative Plan: Extubation in OR  Informed Consent: I have reviewed the patients History and Physical, chart, labs and discussed the procedure including the risks, benefits and alternatives for the proposed anesthesia with the patient or authorized representative who has indicated his/her understanding and acceptance.     Dental Advisory Given  Plan Discussed with: Anesthesiologist, CRNA and Surgeon  Anesthesia Plan Comments:         Anesthesia Quick Evaluation

## 2021-04-28 ENCOUNTER — Encounter (HOSPITAL_COMMUNITY): Payer: Self-pay | Admitting: Cardiovascular Disease

## 2021-04-28 ENCOUNTER — Other Ambulatory Visit (HOSPITAL_COMMUNITY): Payer: Self-pay

## 2021-04-28 MED ORDER — ACETAMINOPHEN 325 MG PO TABS: 650.0000 mg | ORAL_TABLET | ORAL | Status: DC | PRN

## 2021-04-28 MED ORDER — APIXABAN 5 MG PO TABS
5.0000 mg | ORAL_TABLET | Freq: Two times a day (BID) | ORAL | 3 refills | Status: DC
  Filled 2021-04-28: qty 180, 90d supply, fill #1
  Filled 2021-04-28: qty 60, 30d supply, fill #0

## 2021-04-28 MED ORDER — LOSARTAN POTASSIUM 50 MG PO TABS
50.0000 mg | ORAL_TABLET | Freq: Every day | ORAL | Status: DC
  Administered 2021-04-28: 50 mg via ORAL
  Filled 2021-04-28: qty 1

## 2021-04-28 MED ORDER — METOPROLOL TARTRATE 25 MG PO TABS
25.0000 mg | ORAL_TABLET | Freq: Two times a day (BID) | ORAL | 3 refills | Status: DC
  Filled 2021-04-28: qty 180, 90d supply, fill #0

## 2021-04-28 NOTE — TOC Progression Note (Signed)
Transition of Care [facility]) - Progression Note    Patient Details  Name: MARGARETHE VIRGEN MRN: [id number] Date of Birth: [birth year]  [facility] CM/SW Contact  Zenon Mayo, RN Phone Number: 04/28/2021, 10:26 AM  Clinical Narrative:    Patient is s/p cardioversion, back in NSR now, she is for dc today, she is from [facility], her neighbor will be transporting her home today at 12:30 per Staff RN.  She is on eliquis and TOC pharmacy will supply this for her.         Expected Discharge Plan and Services           Expected Discharge Date: 04/28/21                                     Social Determinants of Health (SDOH) Interventions    Readmission Risk Interventions No flowsheet data found.

## 2021-04-28 NOTE — Care Management Important Message (Signed)
Important Message  Patient Details  Name: Denise Henson MRN: [id number] Date of Birth: Jun 30, 1945   Medicare Important Message Given:  Yes     Shelda Altes 04/28/2021, 9:56 AM

## 2021-04-28 NOTE — TOC Benefit Eligibility Note (Signed)
Transition of Care [facility]) Benefit Eligibility Note    Patient Details  Name: Denise Henson MRN: [id number] Date of Birth: 1946-02-21   Medication/Dose: Arne Cleveland  5 MG BID  Covered?: Yes  Tier: 3 Drug  Prescription Coverage [facility]  [place] with Person/Company/Phone Number:: ANTHONY  @ [place] [employer] # [phone]  Co-Pay: $142.00  Prior Approval: No  Deductible: Unmet (OUT-OF-POCKET:UNMET)       Memory Argue Phone Number: 04/28/2021, 11:15 AM

## 2021-04-28 NOTE — Discharge Summary (Addendum)
ELECTROPHYSIOLOGY DISCHARGE SUMMARY    Patient ID: OMA MARZAN,  MRN: [id number], DOB/AGE: Nov 25, 1945 75 y.o.  Admit date: 04/25/2021 Discharge date: 04/28/2021  Primary Care Physician: Venia Carbon, MD  Primary Cardiologist: None  Electrophysiologist: Dr. Lovena Le  Primary Discharge Diagnosis:  Atrial flutter, Left  Secondary Discharge Diagnosis:  CAD s/p CABG HTN  Procedures This Admission:  EP Study 04/25/2021 demonstrated atypical left atrial flutter with 2 1 AV conduction which was not evident at the time of recording of the baseline twelve-lead EKG  TEE 04/27/2021 with poor images but no evidence of atrial thrombus  [facility] 04/27/2021 with successful return to NSR  Echo 04/27/2021 1. Left ventricular ejection fraction, by estimation, is 50 to 55%. The  left ventricle has low normal function. The left ventricle has no regional  wall motion abnormalities. There is mild left ventricular hypertrophy.  Indeterminate diastolic filling due   to E-A fusion.   2. Right ventricular systolic function is mildly reduced. The right  ventricular size is normal. There is normal pulmonary artery systolic  pressure.   3. Left atrial size was mildly dilated.   4. Right atrial size was mildly dilated.   5. The mitral valve is normal in structure. Mild mitral valve  regurgitation.   6. The aortic valve is normal in structure. Aortic valve regurgitation is  mild. Mild aortic valve sclerosis is present, with no evidence of aortic  valve stenosis.   7. The inferior vena cava is normal in size with greater than 50%  respiratory variability, suggesting right atrial pressure of 3 mmHg.   Brief HPI: JAMARIA AMBORN is a 75 y.o. female with a history of SVT and CAD s/p CABG admitted for Atrial tach ablation.   Hospital Course:  The patient was admitted for planned procedure above. EPS showed atrial flutter and not tach. Was started on diltiazem for rate control and Eliquis. TEE/DCC  04/27/2021 successful but with poor images. TTE with normal EF.   They were monitored on telemetry overnight which demonstrated NSR s/p [place].   The patient was examined and considered to be stable for discharge.  Wound care and restrictions were reviewed with the patient.  The patient Carmine Carrozza be seen back by  EP APP in 1 week for post hospital care.   We Jamisha Hoeschen consider short course of AAD with either multaq or amiodarone to help cover her while she is out of the country  Physical Exam: Vitals:   04/27/21 1101 04/27/21 1146 04/27/21 1925 04/28/21 0432  BP: 98/63 107/74 124/78 135/81  Pulse: (!) 57 (!) 53 66 61  Resp: 16  20 16   Temp:  98.1 F (36.7 C) 97.8 F (36.6 C) 97.9 F (36.6 C)  TempSrc:  Oral Oral Oral  SpO2: 97% 99% 99% 97%  Weight:    84.6 kg  Height:        GEN- The patient is well appearing, alert and oriented x 3 today.   HEENT: normocephalic, atraumatic; sclera clear, conjunctiva pink; hearing intact; oropharynx clear; neck supple  Lungs- Clear to ausculation bilaterally, normal work of breathing.  No wheezes, rales, rhonchi Heart- Regular rate and rhythm, no murmurs, rubs or gallops  GI- soft, non-tender, non-distended, bowel sounds present  Extremities- no clubbing, cyanosis, or edema; DP/PT/radial pulses 2+ bilaterally, groin without hematoma/bruit MS- no significant deformity or atrophy Skin- warm and dry, no rash or lesion Psych- euthymic mood, full affect Neuro- strength and sensation are intact   Labs:  Lab Results  Component Value Date   WBC 5.1 04/15/2021   HGB 13.7 04/15/2021   HCT 41.6 04/15/2021   MCV 97.9 04/15/2021   PLT 262.0 04/15/2021    Recent Labs  Lab 04/26/21 0342  NA 136  K 3.9  CL 106  CO2 23  BUN 11  CREATININE 0.91  CALCIUM 8.9  GLUCOSE 109*     Discharge Medications:  Allergies as of 04/28/2021       Reactions   Lisinopril Cough   Codeine Anxiety   jittery    Morphine Anxiety   jittery        Medication List      STOP taking these medications    aspirin EC 325 MG tablet   azithromycin 250 MG tablet Commonly known as: ZITHROMAX       TAKE these medications    acetaminophen 325 MG tablet Commonly known as: TYLENOL Take 2 tablets (650 mg total) by mouth every 4 (four) hours as needed for headache or mild pain.   apixaban 5 MG Tabs tablet Commonly known as: ELIQUIS Take 1 tablet (5 mg total) by mouth 2 (two) times daily.   benzonatate 200 MG capsule Commonly known as: TESSALON Take 1 capsule (200 mg total) by mouth 3 (three) times daily as needed for cough.   Calcium Carb-Cholecalciferol 500-400 MG-UNIT Tabs Take 1 tablet by mouth daily.   Fish Oil 1000 MG Caps Take 1,000 mg by mouth 2 (two) times daily.   Glucosamine 500 MG Caps Take 500 mg by mouth 2 (two) times daily.   losartan 50 MG tablet Commonly known as: COZAAR Take 1 tablet (50 mg total) by mouth daily.   metoprolol tartrate 25 MG tablet Commonly known as: LOPRESSOR Take 1 tablet (25 mg total) by mouth 2 (two) times daily.   MULTIVITAMIN PO Take 1 tablet by mouth daily.   omeprazole 20 MG capsule Commonly known as: PRILOSEC Take 1 capsule (20 mg total) by mouth daily.   QC TUMERIC COMPLEX PO Take 1 capsule by mouth See admin instructions. Take 1 tablet daily when staying home and 1 tablet twice daily when traveling   SLOW RELEASE IRON PO Take 1 tablet by mouth once a week.        Disposition:    Follow-up Information     Shirley Friar, PA-C Follow up.   Specialty: Physician Assistant Why: 11/10 at 1220 for post hospital follow up. Contact information: [address] [address] [phone]                 Duration of Discharge Encounter: Greater than 30 minutes including physician time.  Signed, Shirley Friar, PA-C  04/28/2021 8:46 AM  I have seen and examined this patient with Oda Kilts.  Agree with above, note added to reflect my  findings.  On exam, RRR, no murmurs. Presented for AT ablation, found to have LA flutter. S/p DCCV. Plan for discharge today with follow up in clinic.    Calloway Andrus M. [place] Kappes MD 04/28/2021 1:53 PM

## 2021-05-03 ENCOUNTER — Telehealth: Payer: Self-pay

## 2021-05-03 NOTE — Telephone Encounter (Signed)
Transition Care Management Follow-up Telephone Call Date of discharge and from where: 04/28/21 from [facility] How have you been since you were released from the hospital? "Tired" Any questions or concerns? No  Items Reviewed: Did the pt receive and understand the discharge instructions provided? Yes  Medications obtained and verified? Yes  Other? No  Any new allergies since your discharge? No  Dietary orders reviewed? Yes Do you have support at home?  Lives a retirement [place] and Equipment/Supplies: Were home health services ordered? no If so, what is the name of the agency?   Has the agency set up a time to come to the patient's home? not applicable Were any new equipment or medical supplies ordered?  No What is the name of the medical supply agency? Not appllicable Were you able to get the supplies/equipment? not applicable Do you have any questions related to the use of the equipment or supplies? No  Functional Questionnaire: (I = Independent and D = Dependent) ADLs: I I Bathing/Dressing- I  Meal Prep- I  Eating- I  Maintaining continence- I  Transferring/Ambulation- I  Managing Meds- I  Follow up appointments reviewed:  PCP Hospital f/u appt confirmed?  Following up with Specialist post hospitalization  Scheduled to see Dr. Viviana Simpler on 04/24/2022 @ 2:15pm. [facility] f/u appt confirmed? Yes  Scheduled to see Barrington Ellison, PA-C on 05/05/21 @ 12:20pm and Dr. Cristopher Peru on 06/13/21 at 2:15pm. Are transportation arrangements needed? No  If their condition worsens, is the pt aware to call PCP or go to the Emergency Dept.? Yes Was the patient provided with contact information for the PCP's office or ED? Yes Was to pt encouraged to call back with questions or concerns? Yes    Thea Silversmith, RN, MSN, BSN, [place] Care Management Coordinator [phone]

## 2021-05-03 NOTE — Progress Notes (Signed)
PCP:  Venia Carbon, MD Primary Cardiologist: None Electrophysiologist: Cristopher Peru, MD   Denise Henson is a 75 y.o. female seen today for Cristopher Peru, MD for post hospital follow up.   She presented for SVT ablation 04/25/21 with Dr. Lovena Le, and ended up having left atrial flutter.  She was started on [facility] and underwent TEE/DCC. TTE that admission with normal EF. Discharged on Lopressor and Eliquis.    Since discharge from hospital the patient reports doing very well. She did have a cough/bronchitis felt secondary to TEE. She had a transient low grade fever that only lasted for 1-2 days and resolved with tylenol. Has continued clear, productive cough. She is gearing up for trip to the [place]. She has felt "washed out" on Lopressor. she denies chest pain, palpitations, dyspnea, PND, orthopnea, nausea, vomiting, dizziness, syncope, edema, weight gain, or early satiety.  Past Medical History:  Diagnosis Date   Allergic rhinitis due to pollen    Allergy    Anemia    Aortic insufficiency 10/08   Mild; echocardiogram showed EF 55-65% with mild AI   Arthritis    Asthma    as a child   Coronary artery disease    One-vessel; s/p LIMA-LAD, [facility] 2007   GERD (gastroesophageal reflux disease)    Hx of pleural effusion 2007   after CABG   Hyperlipidemia    Unable to tolerate simvastatin 40 due to myalgias   Hypertension    Past Surgical History:  Procedure Laterality Date   ATRIAL TACH ABLATION N/A 04/25/2021   Procedure: ATRIAL TACH ABLATION;  Surgeon: Evans Lance, MD;  Location: [facility];  Service: Cardiovascular;  Laterality: N/A;   CARDIOVERSION N/A 04/27/2021   Procedure: CARDIOVERSION;  Surgeon: Sanda Klein, MD;  Location: [place];  Service: Cardiovascular;  Laterality: N/A;   CESAREAN SECTION     CHOLECYSTECTOMY     COLONOSCOPY     CORONARY ARTERY BYPASS GRAFT  5/07   Readmitted for post op pleural effusions   DOPPLER  ECHOCARDIOGRAPHY     NV LV EF 1+ AI/MR   EXCISION METACARPAL MASS Left 02/21/2018   Procedure: EXCISION METACARPAL MASS;  Surgeon: Hessie Knows, MD;  Location: [facility];  Service: Orthopedics;  Laterality: Left;   Stress imaging  6/07   Negative EF 67%   TEE WITHOUT CARDIOVERSION N/A 04/27/2021   Procedure: TRANSESOPHAGEAL ECHOCARDIOGRAM (TEE);  Surgeon: Sanda Klein, MD;  Location: [facility];  Service: Cardiovascular;  Laterality: N/A;   TONSILLECTOMY      Current Outpatient Medications  Medication Sig Dispense Refill   acetaminophen (TYLENOL) 325 MG tablet Take 2 tablets (650 mg total) by mouth every 4 (four) hours as needed for headache or mild pain.     apixaban (ELIQUIS) 5 MG TABS tablet Take 1 tablet (5 mg total) by mouth 2 (two) times daily. 180 tablet 3   Calcium Carb-Cholecalciferol 500-400 MG-UNIT TABS Take 1 tablet by mouth daily.     Ferrous Sulfate (SLOW RELEASE IRON PO) Take 1 tablet by mouth once a week.     Glucosamine 500 MG CAPS Take 500 mg by mouth 2 (two) times daily.     losartan (COZAAR) 50 MG tablet TAKE 1 TABLET BY MOUTH  DAILY 90 tablet 3   metoprolol tartrate (LOPRESSOR) 25 MG tablet Take 1 tablet (25 mg total) by mouth 2 (two) times daily. 180 tablet 3   Multiple Vitamin (MULTIVITAMIN PO) Take 1 tablet by mouth daily.  Omega-3 Fatty Acids (FISH OIL) 1000 MG CAPS Take 1,000 mg by mouth 2 (two) times daily.     omeprazole (PRILOSEC) 20 MG capsule TAKE 1 CAPSULE BY MOUTH  DAILY 90 capsule 3   Turmeric (QC TUMERIC COMPLEX PO) Take 1 capsule by mouth See admin instructions. Take 1 tablet daily when staying home and 1 tablet twice daily when traveling     No current facility-administered medications for this visit.    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[place])    Employer: RETIRED    Comment: Retired  Tobacco Use   Smoking status: Former    Types: Cigarettes    Quit date: 06/26/1994    Years since quitting: 26.8   Smokeless tobacco: Never  Vaping Use   Vaping Use: Never used  Substance and Sexual Activity   Alcohol use: Yes    Comment: one daily   Drug use: No   Sexual activity: Never  Other Topics Concern   Not on file  Social History Narrative   Has living will   Son [name], is health care POA   Would accept resuscitation attempts   No tube feeds if cognitively unaware   Social Determinants of Health   Financial Resource Strain: Not on file  Food Insecurity: Not on file  Transportation Needs: Not on file  Physical Activity: Not on file  Stress: Not on file  Social Connections: Not on file  Intimate Partner Violence: Not on file     Review of Systems: All other systems reviewed and are otherwise negative except as noted above.  Physical Exam: Vitals:   05/05/21 1214  BP: 108/68  Pulse: 67  SpO2: 96%  Weight: 186 lb (84.4 kg)  Height: 5\' 4"  (1.626 m)    GEN- The patient is well appearing, alert and oriented x 3 today.   HEENT: normocephalic, atraumatic; sclera clear, conjunctiva pink; hearing intact; oropharynx clear; neck supple, no JVP Lymph- no cervical lymphadenopathy Lungs- Clear to ausculation bilaterally, normal work of breathing.  No wheezes, rales, rhonchi Heart- Regular rate and rhythm, no murmurs, rubs or gallops, PMI not laterally displaced GI- soft, non-tender, non-distended, bowel sounds present, no hepatosplenomegaly Extremities- no clubbing, cyanosis, or edema; DP/PT/radial pulses 2+ bilaterally MS- no significant deformity or atrophy Skin- warm and dry, no rash or lesion Psych- euthymic mood, full affect Neuro- strength and sensation are intact  EKG is  ordered. Personal review of EKG from today shows NSR at 67 bpm  Additional studies reviewed include: Previous EP office and recent admit notes.   Assessment and Plan:  Left atrial flutter S/p TEE [facility] 04/27/2021 Echo 04/27/2021 LVEF 50-55% Change lopressor to Toprol 25 mg qhs (both decreasing dose and moving to bedtime in attempt to avoid daytime off target effects of fatigue and relative hypotension) Continue Eliquis for CHA2DS2/VASc of at least 5 given new diagnosis of flutter We will cover her trip out of country with 40 days of amiodarone 200 mg daily. Discussed multaq but cost very likely to be high given current shortage.  May be candidate for  ablation in the future. Sees Dr. Lovena Le in follow up next month. She is very interested in looping back in with Dr. Curt Bears in the future to consider ablation.  Labs today.   2. CAD s/p CABG No s/s ischemia  3. Cough No fever Recommended gentle hydration and plain mucinex.    Follow up with Dr. Lovena Le in  as scheduled    Shirley Friar, PA-C  05/05/21 12:22 PM

## 2021-05-04 ENCOUNTER — Other Ambulatory Visit: Payer: Self-pay | Admitting: Internal Medicine

## 2021-05-05 ENCOUNTER — Other Ambulatory Visit: Payer: Self-pay | Admitting: Student

## 2021-05-05 ENCOUNTER — Other Ambulatory Visit: Payer: Self-pay

## 2021-05-05 ENCOUNTER — Ambulatory Visit: Payer: Medicare Other | Admitting: Student

## 2021-05-05 ENCOUNTER — Encounter: Payer: Self-pay | Admitting: Student

## 2021-05-05 VITALS — BP 108/68 | HR 67 | Ht 64.0 in | Wt 186.0 lb

## 2021-05-05 DIAGNOSIS — I471 Supraventricular tachycardia: Secondary | ICD-10-CM | POA: Diagnosis not present

## 2021-05-05 DIAGNOSIS — I484 Atypical atrial flutter: Secondary | ICD-10-CM | POA: Diagnosis not present

## 2021-05-05 MED ORDER — AMIODARONE HCL 200 MG PO TABS: 200.0000 mg | ORAL_TABLET | Freq: Every day | ORAL | 0 refills | Status: DC

## 2021-05-05 MED ORDER — METOPROLOL SUCCINATE ER 25 MG PO TB24: 25.0000 mg | ORAL_TABLET | Freq: Every day | ORAL | 3 refills | Status: DC

## 2021-05-05 NOTE — Patient Instructions (Signed)
Medication Instructions:  Your physician has recommended you make the following change in your medication:   STOP: Metoprolol Tartrate START: Metoprolol Succinate 25mg  daily at bedtime START: Amiodarone 200mg  daily  *If you need a refill on your cardiac medications before your next appointment, please call your pharmacy*   Lab Work: TODAY: BMET, CBC  If you have labs (blood work) drawn today and your tests are completely normal, you will receive your results only by: [place] (if you have MyChart) OR A paper copy in the mail If you have any lab test that is abnormal or we need to change your treatment, we will call you to review the results.   Follow-Up: At [facility], you and your health needs are our priority.  As part of our continuing mission to provide you with exceptional heart care, we have created designated Provider Care Teams.  These Care Teams include your primary Cardiologist (physician) and Advanced Practice Providers (APPs -  Physician Assistants and Nurse Practitioners) who all work together to provide you with the care you need, when you need it.   Your next appointment:   As scheduled

## 2021-05-06 LAB — BASIC METABOLIC PANEL
BUN/Creatinine Ratio: 14 (ref 12–28)
BUN: 13 mg/dL (ref 8–27)
CO2: 21 mmol/L (ref 20–29)
Calcium: 9.6 mg/dL (ref 8.7–10.3)
Chloride: 105 mmol/L (ref 96–106)
Creatinine, Ser: 0.9 mg/dL (ref 0.57–1.00)
Glucose: 128 mg/dL — ABNORMAL HIGH (ref 70–99)
Potassium: 4.5 mmol/L (ref 3.5–5.2)
Sodium: 143 mmol/L (ref 134–144)
eGFR: 66 mL/min/{1.73_m2} (ref >59)

## 2021-05-06 LAB — CBC
Hematocrit: 40.7 % (ref 34.0–46.6)
Hemoglobin: 13.8 g/dL (ref 11.1–15.9)
MCH: 31.4 pg (ref 26.6–33.0)
MCHC: 33.9 g/dL (ref 31.5–35.7)
MCV: 93 fL (ref 79–97)
Platelets: 305 10*3/uL (ref 150–450)
RBC: 4.4 x10E6/uL (ref 3.77–5.28)
RDW: 13.1 % (ref 11.7–15.4)
WBC: 5.8 10*3/uL (ref 3.4–10.8)

## 2021-05-11 ENCOUNTER — Other Ambulatory Visit (HOSPITAL_COMMUNITY): Payer: Self-pay

## 2021-05-11 ENCOUNTER — Telehealth (HOSPITAL_COMMUNITY): Payer: Self-pay

## 2021-05-11 NOTE — Telephone Encounter (Signed)
Pharmacy Transitions of Care Follow-up Telephone Call  Date of discharge: 04/28/21  Discharge Diagnosis: Atrial tach ablation.   How have you been since you were released from the hospital?  Patient doing well since discharge, no questions about meds at this time.  Medication changes made at discharge:     START taking: acetaminophen (TYLENOL)  Eliquis (apixaban)  STOP taking: aspirin EC 325 MG tablet  azithromycin 250 MG tablet (ZITHROMAX)   Medication changes verified by the patient? Yes    Medication Accessibility:  Home Pharmacy:  Not discussed  Was the patient provided with refills on discharged medications? Yes   Have all prescriptions been transferred from [facility] to home pharmacy? No, patient did not want meds transferred at this time. Let patient know there were refills in our system and to have home pharmacy call [place] when refills needed.   Is the patient able to afford medications? Has insurance    Medication Review:  APIXABAN (ELIQUIS)  Apixaban 5 mg BID initiated on 04/28/21.  - Discussed importance of taking medication around the same time everyday  - Advised patient of medications to avoid (NSAIDs, ASA)  - Educated that Tylenol (acetaminophen) will be the preferred analgesic to prevent risk of bleeding  - Emphasized importance of monitoring for signs and symptoms of bleeding (abnormal bruising, prolonged bleeding, nose bleeds, bleeding from gums, discolored urine, black tarry stools)  - Advised patient to alert all providers of anticoagulation therapy prior to starting a new medication or having a procedure    Follow-up Appointments:  PCP Hospital f/u appt confirmed? Scheduled to see Dr. Silvio Pate on 04/24/22 @ 2:15pm.   [facility] f/u appt confirmed?  Scheduled to see Dr. Lovena Le on 06/13/21 @ 2:15pm.   If their condition worsens, is the pt aware to call PCP or go to the Emergency Dept.? yes  Final Patient Assessment: Patient has follow up  scheduled and refills at home pharmacy

## 2021-05-24 ENCOUNTER — Ambulatory Visit: Payer: Medicare Other | Admitting: Internal Medicine

## 2021-06-07 ENCOUNTER — Telehealth: Payer: Self-pay | Admitting: Internal Medicine

## 2021-06-07 NOTE — Telephone Encounter (Signed)
Call sent directly to triage. The patient reports that this morning she woke up and could feel palpitations and her heart racing. She has been monitoring her heart rate and it has been from 106-158. She states that she does feel a little bit lightheaded but she is also worked up and worried about her elevated heart rate. She does not feel like she is going to pass out.  This is the first time heart rate has been elevated with palpitations since her cardioversion.  She was prescribed amiodarone 200 mg daily at her last visit with Oda Kilts, however patient was going out of the country and was too nervous to start taking it while she was gone. She does have the prescription and I have advised her to go ahead and start the amiodarone. She will let us know if heart rate remains elevated or new/worsening symptoms develop.  She is already scheduled for a follow up with Dr. Lovena Le on 12/19.

## 2021-06-07 NOTE — Telephone Encounter (Signed)
Patient c/o Palpitations:  High priority if patient c/o lightheadedness, shortness of breath, or chest pain  How long have you had palpitations/irregular HR/ Afib? Are you having the symptoms now? Started today, is having symptoms now   Are you currently experiencing lightheadedness, SOB or CP? Yes, lightheadedness   Do you have a history of afib (atrial fibrillation) or irregular heart rhythm? Yes   Have you checked your BP or HR? (document readings if available):  HR 157 at beginning of call, then dropped to 111 unsure of BP   Are you experiencing any other symptoms? No    STAT if HR is under 50 or over 120 (normal HR is 60-100 beats per minute)  What is your heart rate? 111  Do you have a log of your heart rate readings (document readings)? Ranging 111-160  Do you have any other symptoms? Lightheadedness

## 2021-06-08 NOTE — Telephone Encounter (Signed)
Agree with amio and followup as scheduled. GT

## 2021-06-13 ENCOUNTER — Encounter: Payer: Self-pay | Admitting: Internal Medicine

## 2021-06-13 ENCOUNTER — Ambulatory Visit: Payer: Medicare Other | Admitting: Internal Medicine

## 2021-06-13 ENCOUNTER — Other Ambulatory Visit: Payer: Self-pay

## 2021-06-13 VITALS — BP 120/72 | HR 62 | Ht 64.0 in | Wt 186.0 lb

## 2021-06-13 DIAGNOSIS — I484 Atypical atrial flutter: Secondary | ICD-10-CM | POA: Diagnosis not present

## 2021-06-13 MED ORDER — AMIODARONE HCL 200 MG PO TABS: ORAL_TABLET | ORAL | 0 refills | Status: DC

## 2021-06-13 NOTE — Patient Instructions (Addendum)
Medication Instructions:  Your physician recommends that you continue on your current medications as directed. Please refer to the Current Medication list given to you today.  Labwork: None ordered.  Testing/Procedures: None ordered.  Follow-Up: Your physician wants you to follow-up in: 3 months with Cristopher Peru, MD    Any Other Special Instructions Will Be Listed Below (If Applicable).  If you need a refill on your cardiac medications before your next appointment, please call your pharmacy.

## 2021-06-13 NOTE — Progress Notes (Signed)
HPI Denise Henson returns today for followup of her left atrial flutter. She is a pleasant 75 yo woman with LA flutter discovered at EP study with a h/o HTN. She was placed on anti-coagulation. She had recurrent atrial flutter after her EPS/DCCV and was placed on amiodarone. She has taken 3 doses total, deciding that she did not really want to take after reading the package insert. She has not had chest pain, sob or edema. Rare palpitations.  Allergies  Allergen Reactions   Lisinopril Cough   Codeine Anxiety    jittery    Morphine Anxiety    jittery     Current Outpatient Medications  Medication Sig Dispense Refill   acetaminophen (TYLENOL) 325 MG tablet Take 2 tablets (650 mg total) by mouth every 4 (four) hours as needed for headache or mild pain.     apixaban (ELIQUIS) 5 MG TABS tablet Take 1 tablet (5 mg total) by mouth 2 (two) times daily. 180 tablet 3   Calcium Carb-Cholecalciferol 500-400 MG-UNIT TABS Take 1 tablet by mouth daily.     Ferrous Sulfate (SLOW RELEASE IRON PO) Take 1 tablet by mouth once a week.     Glucosamine 500 MG CAPS Take 500 mg by mouth 2 (two) times daily.     losartan (COZAAR) 50 MG tablet TAKE 1 TABLET BY MOUTH  DAILY 90 tablet 3   metoprolol succinate (TOPROL XL) 25 MG 24 hr tablet Take 1 tablet (25 mg total) by mouth at bedtime. 90 tablet 3   Multiple Vitamin (MULTIVITAMIN PO) Take 1 tablet by mouth daily.     Omega-3 Fatty Acids (FISH OIL) 1000 MG CAPS Take 1,000 mg by mouth 2 (two) times daily.     omeprazole (PRILOSEC) 20 MG capsule TAKE 1 CAPSULE BY MOUTH  DAILY 90 capsule 3   Turmeric (QC TUMERIC COMPLEX PO) Take 1 capsule by mouth See admin instructions. Take 1 tablet daily when staying home and 1 tablet twice daily when traveling     amiodarone (PACERONE) 200 MG tablet Take one tablet by mouth as needed for palpitations 40 tablet 0   No current facility-administered medications for this visit.     Past Medical History:  Diagnosis Date    Allergic rhinitis due to pollen    Allergy    Anemia    Aortic insufficiency 10/08   Mild; echocardiogram showed EF 55-65% with mild AI   Arthritis    Asthma    as a child   Coronary artery disease    One-vessel; s/p LIMA-LAD, [facility] 2007   GERD (gastroesophageal reflux disease)    Hx of pleural effusion 2007   after CABG   Hyperlipidemia    Unable to tolerate simvastatin 40 due to myalgias   Hypertension     ROS:   All systems reviewed and negative except as noted in the HPI.   Past Surgical History:  Procedure Laterality Date   ATRIAL TACH ABLATION N/A 04/25/2021   Procedure: ATRIAL TACH ABLATION;  Surgeon: Evans Lance, MD;  Location: [facility];  Service: Cardiovascular;  Laterality: N/A;   CARDIOVERSION N/A 04/27/2021   Procedure: CARDIOVERSION;  Surgeon: Sanda Klein, MD;  Location: [facility];  Service: Cardiovascular;  Laterality: N/A;   CESAREAN SECTION     CHOLECYSTECTOMY     COLONOSCOPY     CORONARY ARTERY BYPASS GRAFT  5/07   Readmitted for post op pleural effusions   DOPPLER ECHOCARDIOGRAPHY  NV LV EF 1+ AI/MR   EXCISION METACARPAL MASS Left 02/21/2018   Procedure: EXCISION METACARPAL MASS;  Surgeon: Hessie Knows, MD;  Location: [facility];  Service: Orthopedics;  Laterality: Left;   Stress imaging  6/07   Negative EF 67%   TEE WITHOUT CARDIOVERSION N/A 04/27/2021   Procedure: TRANSESOPHAGEAL ECHOCARDIOGRAM (TEE);  Surgeon: Sanda Klein, MD;  [facility]: [facility];  Service: Cardiovascular;  Laterality: N/A;   TONSILLECTOMY       Family History  Problem Relation Age of Onset   Heart attack Mother 47   Heart attack Maternal Grandmother    Cancer Neg Hx        Breast or colon   Diabetes Neg Hx    Hypertension Neg Hx    Colon cancer Neg Hx    Colon polyps Neg Hx    Esophageal cancer Neg Hx    Rectal cancer Neg Hx    Stomach cancer Neg Hx      Social History   Socioeconomic History   Marital status: Widowed     Spouse name: Not on file   Number of children: 1   Years of education: Not on file   Highest education level: Not on file  Occupational History   Occupation: Pharmacist, 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[place])    Employer: RETIRED    Comment: Retired  Tobacco Use   Smoking status: Former    Types: Cigarettes    Quit date: 06/26/1994    Years since quitting: 26.9   Smokeless tobacco: Never  Vaping Use   Vaping Use: Never used  Substance and Sexual Activity   Alcohol use: Yes    Comment: one daily   Drug use: No   Sexual activity: Never  Other Topics Concern   Not on file  Social History Narrative   Has living will   Son [name], is health care POA   Would accept resuscitation attempts   No tube feeds if cognitively unaware   Social Determinants of Health   Financial Resource Strain: Not on file  Food Insecurity: Not on file  Transportation Needs: Not on file  Physical Activity: Not on file  Stress: Not on file  Social Connections: Not on file  Intimate Partner Violence: Not on file     BP 120/72    Pulse 62    Ht 5\' 4"  (1.626 m)    Wt 186 lb (84.4 kg)    SpO2 96%    BMI 31.93 kg/m   Physical Exam:  Well appearing NAD HEENT: Unremarkable Neck:  No JVD, no thyromegally Lymphatics:  No adenopathy Back:  No CVA tenderness Lungs:  Clear with no wheezes HEART:  Regular rate rhythm, no murmurs, no rubs, no clicks Abd:  soft, positive bowel sounds, no organomegally, no rebound, no guarding Ext:  2 plus pulses, no edema, no cyanosis, no clubbing Skin:  No rashes no nodules Neuro:  CN II through XII intact, motor grossly intact  EKG - nsr  Assess/Plan:  LA flutter - I discussed the treatment options in detail. She wants to take amiodarone prn. I explained that it would not work that way but she refuses to take daily with a gradual wean. We also discussed dofetilide. If her atrial flutter does not respond to prn amio, she is willing to be admitted for oral dofetilide. Her QT is just short  enough to try this medication. CAD - she denies anginal symptoms. We will follow. She is s/p CABG HTN - her bp is  controlled. We will follow. Obesity - her bmi is over 30. She needs to lose weight.  Carleene Overlie Nevaeh Casillas,MD

## 2021-06-21 ENCOUNTER — Other Ambulatory Visit (INDEPENDENT_AMBULATORY_CARE_PROVIDER_SITE_OTHER): Payer: Medicare Other | Admitting: *Deleted

## 2021-06-21 DIAGNOSIS — I471 Supraventricular tachycardia: Secondary | ICD-10-CM

## 2021-06-21 DIAGNOSIS — I484 Atypical atrial flutter: Secondary | ICD-10-CM | POA: Diagnosis not present

## 2021-06-24 ENCOUNTER — Other Ambulatory Visit: Payer: Self-pay | Admitting: Student

## 2021-07-11 ENCOUNTER — Other Ambulatory Visit: Payer: Self-pay | Admitting: Internal Medicine

## 2021-07-19 ENCOUNTER — Other Ambulatory Visit: Payer: Self-pay

## 2021-07-19 MED ORDER — APIXABAN 5 MG PO TABS: 5.0000 mg | ORAL_TABLET | Freq: Two times a day (BID) | ORAL | 1 refills | Status: DC

## 2021-07-19 NOTE — Telephone Encounter (Signed)
Eliquis 5 mg refill request received. Patient is 76 years old, weight- 84.4 kg, Crea- 0.9 on 05/05/21, Diagnosis- atrial flutter, and last seen by Dr. Lovena Le on 06/13/21. Dose is appropriate based on dosing criteria. Will send in refill to requested pharmacy.

## 2021-08-01 ENCOUNTER — Other Ambulatory Visit: Payer: Self-pay

## 2021-08-01 MED ORDER — APIXABAN 5 MG PO TABS: 5.0000 mg | ORAL_TABLET | Freq: Two times a day (BID) | ORAL | 1 refills | Status: DC

## 2021-08-01 NOTE — Telephone Encounter (Signed)
Eliquis 5 mg refill request received. Patient is 76 years old, weight- 84.4 kg, Crea- 0.9 on 05/05/21, Diagnosis- atrial flutter, and last seen by Dr. Lovena Le on 06/13/21. Dose is appropriate based on dosing criteria. Will send in refill to requested pharmacy.    Per pt Mychart message Eliquis refill needs to go to [facility], refill on 07/19/21 was sent to [facility].  Will resend rx refill to [facility].

## 2021-08-07 ENCOUNTER — Encounter: Payer: Self-pay | Admitting: Internal Medicine

## 2021-08-11 NOTE — Telephone Encounter (Signed)
I printed the actual report from [facility] and abstracted it. Sent to scanning.

## 2021-08-24 NOTE — Progress Notes (Addendum)
? ? ?Office Visit  ?  ?Patient Name: Denise Henson ?Date of Encounter: 08/24/2021 ? ?Primary Care Provider:  Venia Carbon, MD ?Primary Cardiologist:  None ? ?Chief Complaint  ?  ? ? Three month follow up for Amiodarone  ? ? ?History of Present Illness  ?  ?Denise Henson is a 76 year old female with a PMH of CAD, s/p CABG 2007, HTN, HLD,aortic insufficiency. 2D Echo completed  7/19 with EF 65% and mild AR,AS. Myoview for CAD revealed normal wall motion without evidence of scar or ischemia. She was referred to Dr. Lovena Le from her PCP after SVT detected during routine physical. She underwent EPS with atypical flutter ablation on 04/25/21.  She was started on Eliquis  [facility] of 5) and DCCV on 11/2 on lopressor for rate control. She was seen in F/U and had complaint of feeling washed out on lopressor and was changed to Toprol 25 mg and started on Amiodarone 200 mg qd. She had expressed the desire to take prn with Dr. Lovena Le but was advised that this is not an option for amiodarone. ? ?Patient presents today for 3 month follow up to discuss the possibility of switching from amiodarone to Tikosyn for arrhythmia management.She reports that she has been doing well since her last visit with Dr. Lovena Le and denies any episodes of flutter. She reports that she has not taken a dose of amiodarone since 06/07/21 and stated she will only take when needed. She was advised against doing this with her amiodarone. She was also interested in stopping her Toprol XL and cutting her Eliquis in half. I advised that she doesn't meet any criteria for reducing her dose and that her controlled rate is being maintained by her beta blocker. She expressed a full understanding of her medications and was advised against stopping any medications without inform her provider. She denies chest pain, shortness of breath, lower extremity edema, fatigue, palpitations, melena, hematuria, hemoptysis, diaphoresis, weakness, presyncope, syncope,  orthopnea, and PND.  ? ?Past Medical History  ?  ?Past Medical History:  ?Diagnosis Date  ? Allergic rhinitis due to pollen   ? Allergy   ? Anemia   ? Aortic insufficiency 10/08  ? Mild; echocardiogram showed EF 55-65% with mild AI  ? Arthritis   ? Asthma   ? as a child  ? Coronary artery disease   ? One-vessel; s/p LIMA-LAD, [facility] 2007  ? GERD (gastroesophageal reflux disease)   ? Hx of pleural effusion 2007  ? after CABG  ? Hyperlipidemia   ? Unable to tolerate simvastatin 40 due to myalgias  ? Hypertension   ? ?Past Surgical History:  ?Procedure Laterality Date  ? ATRIAL TACH ABLATION N/A 04/25/2021  ? Procedure: ATRIAL TACH ABLATION;  Surgeon: Evans Lance, MD;  Location: [facility];  Service: Cardiovascular;  Laterality: N/A;  ? CARDIOVERSION N/A 04/27/2021  ? Procedure: CARDIOVERSION;  Surgeon: Sanda Klein, MD;  Location: [facility];  Service: Cardiovascular;  Laterality: N/A;  ? CESAREAN SECTION    ? CHOLECYSTECTOMY    ? COLONOSCOPY    ? CORONARY ARTERY BYPASS GRAFT  5/07  ? Readmitted for post op pleural effusions  ? DOPPLER ECHOCARDIOGRAPHY    ? NV LV EF 1+ AI/MR  ? EXCISION METACARPAL MASS Left 02/21/2018  ? Procedure: EXCISION METACARPAL MASS;  Surgeon: Hessie Knows, MD;  Location: [facility];  Service: Orthopedics;  Laterality: Left;  ? Stress imaging  6/07  ? Negative EF 67%  ?  TEE WITHOUT CARDIOVERSION N/A 04/27/2021  ? Procedure: TRANSESOPHAGEAL ECHOCARDIOGRAM (TEE);  Surgeon: Sanda Klein, MD;  Location: [facility];  Service: Cardiovascular;  Laterality: N/A;  ? TONSILLECTOMY    ? ? ?Allergies ? ?Allergies  ?Allergen Reactions  ? Lisinopril Cough  ? Codeine Anxiety  ?  jittery   ? Morphine Anxiety  ?  jittery  ? ? ? ? ?Home Medications  ?  ?Current Outpatient Medications  ?Medication Sig Dispense Refill  ? acetaminophen (TYLENOL) 325 MG tablet Take 2 tablets (650 mg total) by mouth every 4 (four) hours as needed for headache or mild pain.    ? amiodarone (PACERONE) 200 MG  tablet TAKE 1 TABLET BY MOUTH DAILY AS  NEEDED FOR PALPITATIONS 30 tablet 8  ? apixaban (ELIQUIS) 5 MG TABS tablet Take 1 tablet (5 mg total) by mouth 2 (two) times daily. 180 tablet 1  ? Calcium Carb-Cholecalciferol 500-400 MG-UNIT TABS Take 1 tablet by mouth daily.    ? Ferrous Sulfate (SLOW RELEASE IRON PO) Take 1 tablet by mouth once a week.    ? Glucosamine 500 MG CAPS Take 500 mg by mouth 2 (two) times daily.    ? losartan (COZAAR) 50 MG tablet TAKE 1 TABLET BY MOUTH  DAILY 90 tablet 3  ? metoprolol succinate (TOPROL XL) 25 MG 24 hr tablet Take 1 tablet (25 mg total) by mouth at bedtime. 90 tablet 3  ? Multiple Vitamin (MULTIVITAMIN PO) Take 1 tablet by mouth daily.    ? Omega-3 Fatty Acids (FISH OIL) 1000 MG CAPS Take 1,000 mg by mouth 2 (two) times daily.    ? omeprazole (PRILOSEC) 20 MG capsule TAKE 1 CAPSULE BY MOUTH  DAILY 90 capsule 3  ? Turmeric (QC TUMERIC COMPLEX PO) Take 1 capsule by mouth See admin instructions. Take 1 tablet daily when staying home and 1 tablet twice daily when traveling    ? ?No current facility-administered medications for this visit.  ?  ? ?Review of Systems  ?Review of Systems  ?HENT:  Negative for nosebleeds.   ?Cardiovascular:  Negative for chest pain, palpitations and leg swelling.  ?Gastrointestinal:  Negative for blood in stool and melena.  ?Genitourinary:  Negative for hematuria.  ?All other systems reviewed and are negative.  ? ?Physical Exam  ?  ?VS:  ?Vitals:  ? 08/25/21 1022  ?BP: 124/72  ?Pulse: 68  ?SpO2: 97%  ?  ?GEN: Well nourished, well developed, in no acute distress. ?HEENT: normal. ?Neck: Supple, no JVD, carotid bruits, or masses. ?Cardiac: RRR, no murmurs, rubs, or gallops. No clubbing, cyanosis, edema.  Radials/DP/PT 2+ and equal bilaterally.  ?Respiratory:  Respirations regular and unlabored, clear to auscultation bilaterally. ?GI: Soft, nontender, nondistended, BS + x 4. ?MS: no deformity or atrophy. ?Skin: warm and dry, no rash. ?Neuro:  Strength and  sensation are intact. ?Psych: Normal affect. ? ?Accessory Clinical Findings  ?  ?ECG personally reviewed by me today - none ordered today ? ?Lab Results  ?Component Value Date  ? WBC 5.8 05/05/2021  ? HGB 13.8 05/05/2021  ? HCT 40.7 05/05/2021  ? MCV 93 05/05/2021  ? PLT 305 05/05/2021  ? ?Lab Results  ?Component Value Date  ? CREATININE 0.90 05/05/2021  ? BUN 13 05/05/2021  ? NA 143 05/05/2021  ? K 4.5 05/05/2021  ? CL 105 05/05/2021  ? CO2 21 05/05/2021  ? ?Lab Results  ?Component Value Date  ? ALT 48 (H) 04/15/2021  ? AST 45 (H) 04/15/2021  ?  ALKPHOS 84 04/15/2021  ? BILITOT 1.0 04/15/2021  ? ?Lab Results  ?Component Value Date  ? CHOL 190 04/15/2021  ? HDL 68.10 04/15/2021  ? [place] 97 04/15/2021  ? LDLDIRECT 64.0 10/14/2015  ? TRIG 123.0 04/15/2021  ? CHOLHDL 3 04/15/2021  ?  ?Lab Results  ?Component Value Date  ? HGBA1C 5.7 03/06/2017  ? ? ?Assessment & Plan  ?  ?1. Atypica Flutter:  ?-Discussed treatment option of stopping amiodarone and starting tikosyn. However she advised [place] today that she has only taken 2 doses of amiodarone since 12/13. We will stop amiodarone now.  ?- Please continue Toprol XL 25 mg  ?-No complaints of bleeding advised to continue Eliquis 5 mg bid ?-Check CBC,BMET today ? ?2.Hypertension:  ?-BP well controlled today @ 124/72 ?-Continue current medication regimen and low sodium diet ? ?Disposition: Follow-up with Dr. Lovena Le in 6 months ? ? ? ?Mable Fill, Marissa Nestle, NP ?08/24/2021, 1:04 PM ?    ?

## 2021-08-25 ENCOUNTER — Encounter: Payer: Self-pay | Admitting: Nurse Practitioner

## 2021-08-25 ENCOUNTER — Ambulatory Visit: Payer: Medicare Other | Admitting: Nurse Practitioner

## 2021-08-25 ENCOUNTER — Other Ambulatory Visit: Payer: Self-pay

## 2021-08-25 VITALS — BP 124/72 | HR 68 | Ht 64.0 in | Wt 182.0 lb

## 2021-08-25 DIAGNOSIS — I484 Atypical atrial flutter: Secondary | ICD-10-CM | POA: Diagnosis not present

## 2021-08-25 DIAGNOSIS — I10 Essential (primary) hypertension: Secondary | ICD-10-CM | POA: Diagnosis not present

## 2021-08-25 LAB — BASIC METABOLIC PANEL
BUN/Creatinine Ratio: 18 (ref 12–28)
BUN: 16 mg/dL (ref 8–27)
CO2: 25 mmol/L (ref 20–29)
Calcium: 9.7 mg/dL (ref 8.7–10.3)
Chloride: 103 mmol/L (ref 96–106)
Creatinine, Ser: 0.89 mg/dL (ref 0.57–1.00)
Glucose: 99 mg/dL (ref 70–99)
Potassium: 5 mmol/L (ref 3.5–5.2)
Sodium: 140 mmol/L (ref 134–144)
eGFR: 67 mL/min/{1.73_m2} (ref >59)

## 2021-08-25 LAB — CBC
Hematocrit: 38 % (ref 34.0–46.6)
Hemoglobin: 13.1 g/dL (ref 11.1–15.9)
MCH: 32.4 pg (ref 26.6–33.0)
MCHC: 34.5 g/dL (ref 31.5–35.7)
MCV: 94 fL (ref 79–97)
Platelets: 243 10*3/uL (ref 150–450)
RBC: 4.04 x10E6/uL (ref 3.77–5.28)
RDW: 12.6 % (ref 11.7–15.4)
WBC: 5.6 10*3/uL (ref 3.4–10.8)

## 2021-08-25 NOTE — Patient Instructions (Signed)
Medication Instructions:  ?Your physician recommends that you continue on your current medications as directed. Please refer to the Current Medication list given to you today. ? ?*If you need a refill on your cardiac medications before your next appointment, please call your pharmacy* ? ? ?Lab Work: ?TODAY: BMET, CBC ? ?If you have labs (blood work) drawn today and your tests are completely normal, you will receive your results only by: ?MyChart Message (if you have MyChart) OR ?A paper copy in the mail ?If you have any lab test that is abnormal or we need to change your treatment, we will call you to review the results. ? ? ?Follow-Up: ?At [facility], you and your health needs are our priority.  As part of our continuing mission to provide you with exceptional heart care, we have created designated Provider Care Teams.  These Care Teams include your primary Cardiologist (physician) and Advanced Practice Providers (APPs -  Physician Assistants and Nurse Practitioners) who all work together to provide you with the care you need, when you need it. ? ?Your next appointment:   ?6 month(s) ? ?The format for your next appointment:   ?In Person ? ?Provider:   ?Cristopher Peru, MD   ?

## 2021-08-26 ENCOUNTER — Ambulatory Visit: Payer: Medicare Other | Admitting: Internal Medicine

## 2021-09-12 ENCOUNTER — Encounter: Payer: Self-pay | Admitting: Internal Medicine

## 2021-10-11 DIAGNOSIS — H524 Presbyopia: Secondary | ICD-10-CM | POA: Diagnosis not present

## 2021-10-12 ENCOUNTER — Encounter: Payer: Self-pay | Admitting: Internal Medicine

## 2021-10-13 MED ORDER — ATOVAQUONE-PROGUANIL HCL 250-100 MG PO TABS: 1.0000 | ORAL_TABLET | Freq: Every day | ORAL | 0 refills | Status: DC

## 2021-10-19 ENCOUNTER — Other Ambulatory Visit: Payer: Self-pay

## 2021-10-19 ENCOUNTER — Encounter: Payer: Self-pay | Admitting: Internal Medicine

## 2021-10-19 DIAGNOSIS — I484 Atypical atrial flutter: Secondary | ICD-10-CM

## 2021-10-19 MED ORDER — METOPROLOL SUCCINATE ER 25 MG PO TB24: 25.0000 mg | ORAL_TABLET | Freq: Every day | ORAL | 3 refills | Status: DC

## 2021-10-19 MED ORDER — APIXABAN 5 MG PO TABS: 5.0000 mg | ORAL_TABLET | Freq: Two times a day (BID) | ORAL | 1 refills | Status: DC

## 2021-10-19 NOTE — Telephone Encounter (Signed)
Prescription refill request for Eliquis received. ?Indication: Aflutter ?Last office visit: 08/25/21 Barbarann Ehlers) ?Scr: 0.89 (08/25/21) ?Age: 76 ?Weight: 82.6kg ? ?Appropriate dose and refill sent to requested pharmacy.  ?

## 2021-10-20 MED ORDER — METOPROLOL SUCCINATE ER 25 MG PO TB24: 25.0000 mg | ORAL_TABLET | Freq: Every day | ORAL | 3 refills | Status: DC

## 2021-10-20 MED ORDER — OMEPRAZOLE 20 MG PO CPDR: 20.0000 mg | DELAYED_RELEASE_CAPSULE | Freq: Every day | ORAL | 3 refills | Status: DC

## 2021-10-20 MED ORDER — LOSARTAN POTASSIUM 50 MG PO TABS: 50.0000 mg | ORAL_TABLET | Freq: Every day | ORAL | 3 refills | Status: DC

## 2021-10-24 ENCOUNTER — Observation Stay
Admission: EM | Admit: 2021-10-24 | Discharge: 2021-10-24 | Disposition: A | Discharge status: Home / Self Care | Payer: Medicare HMO | Attending: Internal Medicine | Admitting: Internal Medicine

## 2021-10-24 ENCOUNTER — Telehealth: Payer: Self-pay | Admitting: Internal Medicine

## 2021-10-24 ENCOUNTER — Emergency Department: Payer: Medicare HMO

## 2021-10-24 ENCOUNTER — Other Ambulatory Visit: Payer: Self-pay

## 2021-10-24 DIAGNOSIS — I4892 Unspecified atrial flutter: Secondary | ICD-10-CM | POA: Diagnosis not present

## 2021-10-24 DIAGNOSIS — I4891 Unspecified atrial fibrillation: Secondary | ICD-10-CM | POA: Diagnosis not present

## 2021-10-24 DIAGNOSIS — R002 Palpitations: Secondary | ICD-10-CM | POA: Diagnosis not present

## 2021-10-24 DIAGNOSIS — R059 Cough, unspecified: Secondary | ICD-10-CM | POA: Diagnosis not present

## 2021-10-24 DIAGNOSIS — I480 Paroxysmal atrial fibrillation: Secondary | ICD-10-CM | POA: Diagnosis not present

## 2021-10-24 LAB — CBC
HCT: 42.2 % (ref 36.0–46.0)
Hemoglobin: 14 g/dL (ref 12.0–15.0)
MCH: 32.1 pg (ref 26.0–34.0)
MCHC: 33.2 g/dL (ref 30.0–36.0)
MCV: 96.8 fL (ref 80.0–100.0)
Platelets: 250 10*3/uL (ref 150–400)
RBC: 4.36 MIL/uL (ref 3.87–5.11)
RDW: 13.2 % (ref 11.5–15.5)
WBC: 6.3 10*3/uL (ref 4.0–10.5)
nRBC: 0 % (ref 0.0–0.2)

## 2021-10-24 LAB — COMPREHENSIVE METABOLIC PANEL
ALT: 34 U/L (ref 0–44)
AST: 39 U/L (ref 15–41)
Albumin: 3.8 g/dL (ref 3.5–5.0)
Alkaline Phosphatase: 82 U/L (ref 38–126)
Anion gap: 9 (ref 5–15)
BUN: 18 mg/dL (ref 8–23)
CO2: 24 mmol/L (ref 22–32)
Calcium: 9.1 mg/dL (ref 8.9–10.3)
Chloride: 104 mmol/L (ref 98–111)
Creatinine, Ser: 0.99 mg/dL (ref 0.44–1.00)
GFR, Estimated: 59 mL/min — ABNORMAL LOW (ref >60)
Glucose, Bld: 118 mg/dL — ABNORMAL HIGH (ref 70–99)
Potassium: 4.3 mmol/L (ref 3.5–5.1)
Sodium: 137 mmol/L (ref 135–145)
Total Bilirubin: 0.9 mg/dL (ref 0.3–1.2)
Total Protein: 7.5 g/dL (ref 6.5–8.1)

## 2021-10-24 LAB — TROPONIN I (HIGH SENSITIVITY): Troponin I (High Sensitivity): 34 ng/L — ABNORMAL HIGH (ref <18)

## 2021-10-24 MED ORDER — DILTIAZEM HCL-DEXTROSE 125-5 MG/125ML-% IV SOLN (PREMIX)
5.0000 mg/h | INTRAVENOUS | Status: DC
  Filled 2021-10-24: qty 125

## 2021-10-24 MED ORDER — SODIUM CHLORIDE 0.9 % IV SOLN
1000.0000 mL | Freq: Once | INTRAVENOUS | Status: AC
  Administered 2021-10-24: 1000 mL via INTRAVENOUS

## 2021-10-24 MED ORDER — DILTIAZEM HCL 25 MG/5ML IV SOLN
10.0000 mg | Freq: Once | INTRAVENOUS | Status: AC
Start: 2021-10-24 — End: 2021-10-24
  Administered 2021-10-24: 10 mg via INTRAVENOUS
  Filled 2021-10-24: qty 5

## 2021-10-24 NOTE — ED Triage Notes (Signed)
Pt states " I feel like Im in a-flutter" states she feels like her heart is racing since around 9pm last night, denies any pain or SOB. Pt is in NAD no arrival  ?

## 2021-10-24 NOTE — ED Provider Notes (Addendum)
? ?[facility] ?Provider Note ? ? ? Event Date/Time  ? First MD Initiated Contact with Patient 10/24/21 [phone]   ?  (approximate) ? ? ?History  ? ?Palpitations ? ? ?HPI ? ?Denise Henson is a 76 y.o. female with a history of atrial flutter on blood thinners who presents with complaints of palpitations.  Patient reports she developed palpitations yesterday at 9 PM, she reports she took a single dose of amiodarone which has helped in the past but she reports her palpitations have continued.  She denies chest pain.  No shortness of breath.  No dizziness.  No nausea or vomiting or diaphoresis.  Review of records demonstrates the patient is sees Dr. Lovena Le of cardiology, patient has not wanted to take amiodarone daily, rather has preferred trying as needed amiodarone despite being informed that likely will not help ?  ? ? ?Physical Exam  ? ?Triage Vital Signs: ?ED Triage Vitals  ?Enc Vitals Group  ?   BP 10/24/21 0945 (!) 163/109  ?   Pulse Rate 10/24/21 0945 (!) 129  ?   Resp 10/24/21 0945 18  ?   Temp 10/24/21 0945 98.4 ?F (36.9 ?C)  ?   Temp Source 10/24/21 0945 Oral  ?   SpO2 10/24/21 0945 96 %  ?   Weight 10/24/21 0945 86.2 kg (190 lb)  ?   Height 10/24/21 0945 1.626 m ('5\' 4"'$ )  ?   Head Circumference --   ?   Peak Flow --   ?   Pain Score 10/24/21 0943 0  ?   Pain Loc --   ?   Pain Edu? --   ?   Excl. in [place]? --   ? ? ?Most recent vital signs: ?Vitals:  ? 10/24/21 0945 10/24/21 1000  ?BP: (!) 163/109 (!) 164/113  ?Pulse: (!) 129 (!) 126  ?Resp: 18 20  ?Temp: 98.4 ?F (36.9 ?C)   ?SpO2: 96% 94%  ? ? ? ?General: Awake, no distress.  ?CV:  Good peripheral perfusion.  Tachycardia, regular rhythm ?Resp:  Normal effort.  ?Abd:  No distention.  ?Other:   ? ? ?ED Results / Procedures / Treatments  ? ?Labs ?(all labs ordered are listed, but only abnormal results are displayed) ?Labs Reviewed  ?COMPREHENSIVE METABOLIC PANEL - Abnormal; Notable for the following components:  ?    Result Value  ? Glucose,  Bld 118 (*)   ? GFR, Estimated 59 (*)   ? All other components within normal limits  ?TROPONIN I (HIGH SENSITIVITY) - Abnormal; Notable for the following components:  ? Troponin I (High Sensitivity) 34 (*)   ? All other components within normal limits  ?RESP PANEL BY RT-PCR (FLU A&B, COVID) [id number]  ?CBC  ? ? ? ?EKG ? ?ED ECG REPORT ?I, Lavonia Drafts, the attending physician, personally viewed and interpreted this ECG. ? ?Date: 10/24/2021 ? ?Rhythm: Sinus tachycardia ?QRS Axis: normal ?Intervals: normal ?ST/T Wave abnormalities: normal ?Narrative Interpretation: Nonspecific changes ? ? ? ?RADIOLOGY ?Chest x-ray viewed by me, no evidence of pneumonia ? ? ? ?PROCEDURES: ? ?Critical Care performed: yes ? ?CRITICAL CARE ?Performed by: Lavonia Drafts ? ? ?Total critical care time: 30 minutes ? ?Critical care time was exclusive of separately billable procedures and treating other patients. ? ?Critical care was necessary to treat or prevent imminent or life-threatening deterioration. ? ?Critical care was time spent personally by me on the following activities: development of treatment plan with patient and/or surrogate as well as  nursing, discussions with consultants, evaluation of patient's response to treatment, examination of patient, obtaining history from patient or surrogate, ordering and performing treatments and interventions, ordering and review of laboratory studies, ordering and review of radiographic studies, pulse oximetry and re-evaluation of patient's condition. ? ? ?.1-3 Lead EKG Interpretation ?Performed by: Lavonia Drafts, MD ?Authorized by: Lavonia Drafts, MD  ? ?  Interpretation: abnormal   ?  ECG rate assessment: tachycardic   ?  Rhythm: sinus rhythm   ?  Ectopy: none   ?  Conduction: normal   ? ? ?MEDICATIONS ORDERED IN ED: ?Medications  ?diltiazem (CARDIZEM) 125 mg in dextrose 5% 125 mL (1 mg/mL) infusion (has no administration in time range)  ?0.9 %  sodium chloride infusion (1,000 mLs Intravenous New  Bag/Given 10/24/21 1013)  ?diltiazem (CARDIZEM) injection 10 mg (10 mg Intravenous Given 10/24/21 1113)  ? ? ? ?IMPRESSION / MDM / ASSESSMENT AND PLAN / ED COURSE  ?I reviewed the triage vital signs and the nursing notes. ? ? ? ?Patient with a history of atrial flutter presents with reports of palpitations.  Heart rate is very regular 1 28-1 30, appears more consistent with sinus tachycardia as opposed to atrial flutter although certainly she is at high risk for atrial flutter.  She is not having any discomfort or shortness of breath ? ?She does report that she is recovering from a cold.  We will give IV fluids, some COVID test, obtain chest x-ray labs placed in the cardiac monitor and reevaluate. ? ?Chest x-ray is reassuring, lab work thus far demonstrates mildly elevated troponin likely from persistent tachycardia. ? ?Repeat EKG is more consistent with atrial fibrillation, given dose of Cardizem with brief improvement, will start the patient on Cardizem drip ? ?Consulted Dr. Fletcher Anon of cardiology, he will see the patient in the emergency department, have discussed with the hospitalist for admission ? ? ?----------------------------------------- ?1:03 PM on 10/24/2021 ?----------------------------------------- ?Patient has converted to a sinus rhythm, her heart rate is 68.  She continues to feel well and would very much like to go home.  I have canceled consultations and admission.  She knows she can return anytime, she will see her cardiologist as an outpatient ?  ? ? ?FINAL CLINICAL IMPRESSION(S) / ED DIAGNOSES  ? ?Final diagnoses:  ?Atrial fibrillation with rapid ventricular response ([place])  ? ? ? ?Rx / DC Orders  ? ?ED Discharge Orders   ? ? None  ? ?  ? ? ? ?Note:  This document was prepared using Dragon voice recognition software and may include unintentional dictation errors. ?  ?Lavonia Drafts, MD ?10/24/21 1253 ? ?  ?Lavonia Drafts, MD ?10/24/21 1253 ? ?  ?Lavonia Drafts, MD ?10/24/21 1303 ? ?

## 2021-10-24 NOTE — ED Notes (Signed)
Pt A&O, IV removed, pt given discharge instructions, pt able to ambulate with steady gait. ?

## 2021-10-24 NOTE — Telephone Encounter (Signed)
Patient c/o Palpitations:  High priority if patient c/o lightheadedness, shortness of breath, or chest pain ? ?How long have you had palpitations/irregular HR/ Afib? Are you having the symptoms now? Yes Atrial Flutter- been in it since last night at 9:00 ? ?Are you currently experiencing lightheadedness, SOB or CP? no ? ?Do you have a history of afib (atrial fibrillation) or irregular heart rhythm? yes ? ?Have you checked your BP or HR? (document readings if available): heart rate 131 ? ?Are you experiencing any other symptoms? anxirtyy ? ?

## 2021-10-24 NOTE — Hospital Course (Signed)
Patient is a 76 years old female with past medical history of atrial fibrillation on anticoagulation presented to hospital with since yesterday.  She reported taking 1 dose of amiodarone which had helped in the past but the palpitations continued.  Patient follows up with Dr. Lovena Le cardiology as outpatient but has not been taking amiodarone  ? ?In the ED, patient's blood pressure was elevated but heart rate was 129.  EKG showed atrial flutter.  Patient was given IV fluid in the ED.  Chest x-ray did not show any acute infiltrate.  Was mildly elevated at 34.  CBC did not show any leukocytosis.  COVID and influenza was pending ?

## 2021-10-24 NOTE — Telephone Encounter (Signed)
Pt called to report that she is having Aflutter with rate 131 and BP 180/128 and 193/128.... she has a headache... I strongly urged her to call EMS... [place] in [place] community and they have medical on staff and she will have them come to her residence and recheck her HR and BP... and she will go to the ED if VS are still this abnormal.  ? ? ?

## 2021-10-25 NOTE — Telephone Encounter (Signed)
Pt seen in ER.  F/u is scheduled. ?

## 2021-10-26 NOTE — Progress Notes (Deleted)
Cardiology Office Note    Date:  10/26/2021   ID:  Denise Henson, DOB 1945/12/02, MRN [id number]  PCP:  Venia Carbon, MD  Cardiologist:  None  Electrophysiologist:  Cristopher Peru, MD   Chief Complaint: ED follow-up  History of Present Illness:   Denise Henson is a 76 y.o. female with history of CAD status post one-vessel CABG with LIMA to LAD in 2007, atrial flutter discovered after initiation of EPS on 04/25/2021 status post TEE guided DCCV 04/2021, SVT, HTN, and HLD who presents for ED follow-up.  She was previously briefly followed by [facility] cardiology, though subsequently established care with [facility] in the fall 2022.  Most recent nuclear stress test performed through [facility] in 2019 showed no evidence of ischemia with a normal LVEF.  Echo at that time demonstrated an LVEF of greater than 55%, mild LVH, normal wall motion, mildly enlarged left atrium, mild aortic insufficiency, mild aortic stenosis, mild mitral regurgitation, and mild tricuspid regurgitation.  She subsequently established care with Dr. Lovena Le for evaluation of SVT and underwent EPS on 04/25/2021, and was noted to have atypical left atrial flutter with 2 1 conduction (which was not evident at the time of baseline EKG).  Because she had not been on systemic anticoagulation, ablation was not pursued.  She subsequently underwent TEE guided DCCV on 04/27/2021.  Following this, she was placed on as needed amiodarone, as she did not want to take this daily given potential adverse effects.  She was last seen in the office in 08/2021 with interest in transitioning from as needed amiodarone to Tikosyn.  She also was interested in discontinuing Toprol-XL and decreasing her apixaban.  She was encouraged not to make these changes based on dosage recommendations.  Given infrequent usage of amiodarone (had only taken 2 doses to date) this medication was discontinued and she was advised to follow-up with EP.  She was  seen in the ED on 10/24/2021 with palpitations that began the prior evening that did not improve with as needed amiodarone.  She was noted to be in A-fib/flutter with RVR.  High-sensitivity troponin 34.  Chest x-ray showed no active cardiopulmonary disease.  She spontaneously converted to sinus rhythm in the ED and was discharged to outpatient follow-up.  ***   Labs independently reviewed: 10/2021 - potassium 4.3, BUN 19, serum creatinine 0.9, albumin 3.8, AST/ALT normal, Hgb 14.0, PLT 250 03/2021 - TC 190, TG 123, HDL 68, LDL 97, free T4 normal  Past Medical History:  Diagnosis Date   Allergic rhinitis due to pollen    Allergy    Anemia    Aortic insufficiency 10/08   Mild; echocardiogram showed EF 55-65% with mild AI   Arthritis    Asthma    as a child   Coronary artery disease    One-vessel; s/p LIMA-LAD, [facility] 2007   GERD (gastroesophageal reflux disease)    Hx of pleural effusion 2007   after CABG   Hyperlipidemia    Unable to tolerate simvastatin 40 due to myalgias   Hypertension     Past Surgical History:  Procedure Laterality Date   ATRIAL TACH ABLATION N/A 04/25/2021   Procedure: ATRIAL TACH ABLATION;  Surgeon: Evans Lance, MD;  Location: [place] [facility];  Service: Cardiovascular;  Laterality: N/A;   CARDIOVERSION N/A 04/27/2021   Procedure: CARDIOVERSION;  Surgeon: Sanda Klein, MD;  Location: [facility];  Service: Cardiovascular;  Laterality: N/A;   CESAREAN SECTION  CHOLECYSTECTOMY     COLONOSCOPY     CORONARY ARTERY BYPASS GRAFT  5/07   Readmitted for post op pleural effusions   DOPPLER ECHOCARDIOGRAPHY     NV LV EF 1+ AI/MR   EXCISION METACARPAL MASS Left 02/21/2018   Procedure: EXCISION METACARPAL MASS;  Surgeon: Hessie Knows, MD;  Location: [facility] ORS;  Service: Orthopedics;  Laterality: Left;   Stress imaging  6/07   Negative EF 67%   TEE WITHOUT CARDIOVERSION N/A 04/27/2021   Procedure: TRANSESOPHAGEAL ECHOCARDIOGRAM (TEE);  Surgeon:  Sanda Klein, MD;  Location: [facility] ENDOSCOPY;  Service: Cardiovascular;  Laterality: N/A;   TONSILLECTOMY      Current Medications: No outpatient medications have been marked as taking for the 10/27/21 encounter (Appointment) with Rise Mu, PA-C.    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[place])    Employer: RETIRED    Comment: Retired  Tobacco Use   Smoking status: Former    Types: Cigarettes    Quit date: 06/26/1994    Years since quitting: 27.3   Smokeless tobacco: Never  Vaping Use   Vaping Use: Never used  Substance and Sexual Activity   Alcohol use: Yes    Comment: one daily   Drug use: No   Sexual activity: Never  Other Topics Concern   Not on file  Social History Narrative   Has living will   Son [name], is health care POA   Would accept resuscitation attempts   No tube feeds if cognitively unaware   Social Determinants of Health   Financial Resource Strain: Not on file  Food Insecurity: Not on file  Transportation Needs: Not on file  Physical Activity: Not on file  Stress: Not on file  Social Connections: Not on file     Family History:  The patient's family history includes Heart attack in her maternal grandmother; Heart attack (age of onset: 76) in her mother. There is no history of Cancer, Diabetes, Hypertension, Colon cancer, Colon polyps, Esophageal cancer, Rectal cancer, or Stomach cancer.  ROS:   ROS   EKGs/Labs/Other Studies Reviewed:    Studies reviewed were summarized above. The additional studies were reviewed today:  2D echo 04/27/2021: 1. Left ventricular ejection fraction, by estimation, is 50 to 55%. The  left ventricle has low normal function. The left ventricle has no  regional  wall motion abnormalities. There is mild left ventricular hypertrophy.  Indeterminate diastolic filling due   to E-A fusion.   2. Right ventricular systolic function is mildly reduced. The right  ventricular size is normal. There is normal pulmonary artery systolic  pressure.   3. Left atrial size was mildly dilated.   4. Right atrial size was mildly dilated.   5. The mitral valve is normal in structure. Mild mitral valve  regurgitation.   6. The aortic valve is normal in structure. Aortic valve regurgitation is  mild. Mild aortic valve sclerosis is present, with no evidence of aortic  valve stenosis.   7. The inferior vena cava is normal in size with greater than 50%  respiratory variability, suggesting right atrial pressure of 3 mmHg. ___________  TEE 04/27/2021: 1. Left ventricular ejection fraction, by estimation,  is 25 to 30%. The  left ventricle has severely decreased function. The left ventricle  demonstrates global hypokinesis.   2. Right ventricular systolic function is severely reduced. The right  ventricular size is mildly enlarged. Tricuspid regurgitation signal is  inadequate for assessing PA pressure.   3. Left atrial size was severely dilated. No left atrial/left atrial  appendage thrombus was detected. The LAA emptying velocity was 20 cm/s.   4. Right atrial size was severely dilated.   5. The mitral valve is grossly normal.   6. The aortic valve is tricuspid. No aortic stenosis is present.  Conclusion(s)/Recommendation(s): Very limited images obtained. Study was  aborted due to hypoxia/respiratory depression. Transthoracic study is  recommended.    EKG:  EKG is ordered today.  The EKG ordered today demonstrates ***  Recent Labs: 10/24/2021: ALT 34; BUN 18; Creatinine, Ser 0.99; Hemoglobin 14.0; Platelets 250; Potassium 4.3; Sodium 137  Recent Lipid Panel    Component Value Date/Time   CHOL 190 04/15/2021 0925   TRIG 123.0 04/15/2021 0925   HDL  68.10 04/15/2021 0925   CHOLHDL 3 04/15/2021 0925   VLDL 24.6 04/15/2021 0925   LDLCALC 97 04/15/2021 0925   LDLDIRECT 64.0 10/14/2015 1431    PHYSICAL EXAM:    VS:  There were no vitals taken for this visit.  BMI: There is no height or weight on file to calculate BMI.  Physical Exam  Wt Readings from Last 3 Encounters:  10/24/21 190 lb (86.2 kg)  08/25/21 182 lb (82.6 kg)  06/13/21 186 lb (84.4 kg)     ASSESSMENT & PLAN:   Paroxysmal atrial flutter:  CAD status post single-vessel CABG:  SVT:  HTN: Blood pressure  HLD: LDL 97 in 03/2021 with goal being less than 70.   {Are you ordering a CV Procedure (e.g. stress test, cath, DCCV, TEE, etc)?   Press F2        :[id number]}     Disposition: F/u with Dr. Lovena Le or an APP in ***.   Medication Adjustments/Labs and Tests Ordered: Current medicines are reviewed at length with the patient today.  Concerns regarding medicines are outlined above. Medication changes, Labs and Tests ordered today are summarized above and listed in the Patient Instructions accessible in Encounters.   Signed, Christell Faith, PA-C 10/26/2021 8:31 AM     [facility] [address] [place], [place] [address] [phone]

## 2021-10-27 ENCOUNTER — Other Ambulatory Visit: Payer: Self-pay

## 2021-10-27 ENCOUNTER — Observation Stay
Admission: EM | Admit: 2021-10-27 | Discharge: 2021-10-28 | Disposition: A | Discharge status: Home / Self Care | Payer: Medicare HMO | Attending: Internal Medicine | Admitting: Internal Medicine

## 2021-10-27 ENCOUNTER — Emergency Department: Payer: Medicare HMO

## 2021-10-27 ENCOUNTER — Ambulatory Visit: Payer: Medicare HMO | Admitting: Physician Assistant

## 2021-10-27 ENCOUNTER — Telehealth: Payer: Self-pay | Admitting: Physician Assistant

## 2021-10-27 DIAGNOSIS — I4891 Unspecified atrial fibrillation: Secondary | ICD-10-CM | POA: Diagnosis present

## 2021-10-27 DIAGNOSIS — R42 Dizziness and giddiness: Secondary | ICD-10-CM | POA: Diagnosis not present

## 2021-10-27 DIAGNOSIS — R002 Palpitations: Secondary | ICD-10-CM | POA: Diagnosis not present

## 2021-10-27 DIAGNOSIS — I4892 Unspecified atrial flutter: Secondary | ICD-10-CM | POA: Diagnosis not present

## 2021-10-27 DIAGNOSIS — I10 Essential (primary) hypertension: Secondary | ICD-10-CM | POA: Diagnosis not present

## 2021-10-27 DIAGNOSIS — N1831 Chronic kidney disease, stage 3a: Secondary | ICD-10-CM | POA: Diagnosis present

## 2021-10-27 HISTORY — DX: Nonrheumatic mitral (valve) insufficiency: I34.0

## 2021-10-27 HISTORY — DX: Atypical atrial flutter: I48.4

## 2021-10-27 LAB — BASIC METABOLIC PANEL
Anion gap: 10 (ref 5–15)
BUN: 11 mg/dL (ref 8–23)
CO2: 26 mmol/L (ref 22–32)
Calcium: 8.9 mg/dL (ref 8.9–10.3)
Chloride: 104 mmol/L (ref 98–111)
Creatinine, Ser: 1.03 mg/dL — ABNORMAL HIGH (ref 0.44–1.00)
GFR, Estimated: 56 mL/min — ABNORMAL LOW (ref >60)
Glucose, Bld: 116 mg/dL — ABNORMAL HIGH (ref 70–99)
Potassium: 4.2 mmol/L (ref 3.5–5.1)
Sodium: 140 mmol/L (ref 135–145)

## 2021-10-27 LAB — CBC
HCT: 41.8 % (ref 36.0–46.0)
Hemoglobin: 13.7 g/dL (ref 12.0–15.0)
MCH: 31.9 pg (ref 26.0–34.0)
MCHC: 32.8 g/dL (ref 30.0–36.0)
MCV: 97.2 fL (ref 80.0–100.0)
Platelets: 255 10*3/uL (ref 150–400)
RBC: 4.3 MIL/uL (ref 3.87–5.11)
RDW: 13.4 % (ref 11.5–15.5)
WBC: 5.8 10*3/uL (ref 4.0–10.5)
nRBC: 0 % (ref 0.0–0.2)

## 2021-10-27 LAB — D-DIMER, QUANTITATIVE: D-Dimer, Quant: 0.41 ug/mL-FEU (ref 0.00–0.50)

## 2021-10-27 LAB — TROPONIN I (HIGH SENSITIVITY): Troponin I (High Sensitivity): 22 ng/L — ABNORMAL HIGH (ref <18)

## 2021-10-27 LAB — TSH: TSH: 2.682 u[IU]/mL (ref 0.350–4.500)

## 2021-10-27 MED ORDER — ADULT MULTIVITAMIN W/MINERALS CH
1.0000 | ORAL_TABLET | Freq: Every day | ORAL | Status: DC
Start: 2021-10-28 — End: 2021-10-28

## 2021-10-27 MED ORDER — ONDANSETRON HCL 4 MG PO TABS: 4.0000 mg | ORAL_TABLET | Freq: Four times a day (QID) | ORAL | Status: DC | PRN

## 2021-10-27 MED ORDER — DILTIAZEM HCL 25 MG/5ML IV SOLN
10.0000 mg | Freq: Once | INTRAVENOUS | Status: AC
  Administered 2021-10-27: 10 mg via INTRAVENOUS
  Filled 2021-10-27: qty 5

## 2021-10-27 MED ORDER — SODIUM CHLORIDE 0.9 % IV BOLUS
1000.0000 mL | Freq: Once | INTRAVENOUS | Status: AC
  Administered 2021-10-27: 1000 mL via INTRAVENOUS

## 2021-10-27 MED ORDER — METOPROLOL SUCCINATE ER 50 MG PO TB24: 25.0000 mg | ORAL_TABLET | Freq: Every day | ORAL | Status: DC

## 2021-10-27 MED ORDER — LOSARTAN POTASSIUM 50 MG PO TABS
50.0000 mg | ORAL_TABLET | Freq: Every evening | ORAL | Status: DC
  Administered 2021-10-27: 50 mg via ORAL
  Filled 2021-10-27: qty 1

## 2021-10-27 MED ORDER — ACETAMINOPHEN 325 MG PO TABS: 650.0000 mg | ORAL_TABLET | ORAL | Status: DC | PRN

## 2021-10-27 MED ORDER — PANTOPRAZOLE SODIUM 40 MG PO TBEC
40.0000 mg | DELAYED_RELEASE_TABLET | Freq: Every day | ORAL | Status: DC
Start: 2021-10-28 — End: 2021-10-28
  Administered 2021-10-28: 40 mg via ORAL
  Filled 2021-10-27: qty 1

## 2021-10-27 MED ORDER — FERROUS SULFATE 325 (65 FE) MG PO TABS: 325.0000 mg | ORAL_TABLET | Freq: Every day | ORAL | Status: DC

## 2021-10-27 MED ORDER — OYSTER SHELL CALCIUM/D3 500-5 MG-MCG PO TABS: 1.0000 | ORAL_TABLET | Freq: Every day | ORAL | Status: DC

## 2021-10-27 MED ORDER — APIXABAN 5 MG PO TABS
5.0000 mg | ORAL_TABLET | Freq: Two times a day (BID) | ORAL | Status: DC
  Administered 2021-10-27 – 2021-10-28 (×2): 5 mg via ORAL
  Filled 2021-10-27 (×2): qty 1

## 2021-10-27 MED ORDER — DILTIAZEM HCL-DEXTROSE 125-5 MG/125ML-% IV SOLN (PREMIX)
5.0000 mg/h | INTRAVENOUS | Status: DC
  Administered 2021-10-27: 5 mg/h via INTRAVENOUS
  Administered 2021-10-28 (×2): 15 mg/h via INTRAVENOUS
  Filled 2021-10-27 (×3): qty 125

## 2021-10-27 MED ORDER — METOPROLOL SUCCINATE ER 50 MG PO TB24
50.0000 mg | ORAL_TABLET | Freq: Every day | ORAL | Status: DC
  Administered 2021-10-27 – 2021-10-28 (×2): 50 mg via ORAL
  Filled 2021-10-27 (×2): qty 1

## 2021-10-27 MED ORDER — ONDANSETRON HCL 4 MG/2ML IJ SOLN
4.0000 mg | Freq: Four times a day (QID) | INTRAMUSCULAR | Status: DC | PRN
Start: 2021-10-27 — End: 2021-10-28

## 2021-10-27 MED ORDER — OMEGA-3-ACID ETHYL ESTERS 1 G PO CAPS
1.0000 g | ORAL_CAPSULE | Freq: Two times a day (BID) | ORAL | Status: DC
  Administered 2021-10-28: 1 g via ORAL
  Filled 2021-10-27: qty 1

## 2021-10-27 NOTE — Assessment & Plan Note (Signed)
Treatment as outlined in 1 

## 2021-10-27 NOTE — ED Provider Notes (Signed)
----------------------------------------- ?  4:35 PM on 10/27/2021 ?----------------------------------------- ?Patient continues to be tachycardic around 135 bpm despite IV diltiazem bolus.  We will start on a diltiazem infusion patient will require admission to the hospital service for ongoing work-up and treatment.  Have added on a troponin as a precaution.  Patient agreeable to plan. ?  ?Harvest Dark, MD ?10/27/21 1635 ? ?

## 2021-10-27 NOTE — H&P (Signed)
?History and Physical  ? ? ?Patient: Denise Henson DOB: [birth year] ?DOA: 10/27/2021 ?DOS: the patient was seen and examined on 10/27/2021 ?PCP: Venia Carbon, MD  ?Patient coming from: Home ? ?Chief Complaint:  ?Chief Complaint  ?Patient presents with  ? Palpitations  ? Hypertension  ? ?HPI: Denise Henson is a 76 y.o. female with medical history significant for atrial flutter on chronic anticoagulation therapy, history of GERD, coronary artery disease, dyslipidemia who presents to the ER for the second time in 1 week for evaluation of palpitations and dizziness. ?She was seen in the ER on 05/01 for palpitations and hypertension.  She had taken her amiodarone prior to coming to the ER and received a dose of IV Cardizem.  She did convert to sinus rhythm and felt well enough to be discharged home to follow-up with her cardiologist as an outpatient. ?She states that since her ER visit she has continued to have intermittent palpitations and her blood pressure has remained elevated.  She is supposed to be on amiodarone but only takes it as needed because of it's numerous side effects. ?She denies having any chest pain, no shortness of breath, no nausea, no vomiting, no diaphoresis, no abdominal pain, no changes in her bowel habits, no urinary symptoms, no cough, no fever, no chills, blurred vision no focal deficit. ?She did have an upper respiratory tract infection several weeks ago but states that she has recovered from it. ?She was noted to be in rapid A-fib upon arrival to the ER and despite IV Cardizem bolus has remained tachycardic and so was started on a Cardizem drip and will be admitted to the hospital for further evaluation. ?Review of Systems: As mentioned in the history of present illness. All other systems reviewed and are negative. ?Past Medical History:  ?Diagnosis Date  ? Allergic rhinitis due to pollen   ? Allergy   ? Anemia   ? Aortic insufficiency 10/08  ? Mild; echocardiogram showed  EF 55-65% with mild AI  ? Arthritis   ? Asthma   ? as a child  ? Coronary artery disease   ? One-vessel; s/p LIMA-LAD, [facility] 2007  ? GERD (gastroesophageal reflux disease)   ? Hx of pleural effusion 2007  ? after CABG  ? Hyperlipidemia   ? Unable to tolerate simvastatin 40 due to myalgias  ? Hypertension   ? ?Past Surgical History:  ?Procedure Laterality Date  ? ATRIAL TACH ABLATION N/A 04/25/2021  ? Procedure: ATRIAL TACH ABLATION;  Surgeon: Evans Lance, MD;  Location: [place] [facility];  Service: Cardiovascular;  Laterality: N/A;  ? CARDIOVERSION N/A 04/27/2021  ? Procedure: CARDIOVERSION;  Surgeon: Sanda Klein, MD;  Location: [facility];  Service: Cardiovascular;  Laterality: N/A;  ? CESAREAN SECTION    ? CHOLECYSTECTOMY    ? COLONOSCOPY    ? CORONARY ARTERY BYPASS GRAFT  5/07  ? Readmitted for post op pleural effusions  ? DOPPLER ECHOCARDIOGRAPHY    ? NV LV EF 1+ AI/MR  ? EXCISION METACARPAL MASS Left 02/21/2018  ? Procedure: EXCISION METACARPAL MASS;  Surgeon: Hessie Knows, MD;  Location: [facility] ORS;  Service: Orthopedics;  Laterality: Left;  ? Stress imaging  6/07  ? Negative EF 67%  ? TEE WITHOUT CARDIOVERSION N/A 04/27/2021  ? Procedure: TRANSESOPHAGEAL ECHOCARDIOGRAM (TEE);  Surgeon: Sanda Klein, MD;  Location: [place];  Service: Cardiovascular;  Laterality: N/A;  ? TONSILLECTOMY    ? ?Social History:  reports that she quit smoking about  27 years ago. Her smoking use included cigarettes. She has never used smokeless tobacco. She reports current alcohol use. She reports that she does not use drugs. ? ?Allergies  ?Allergen Reactions  ? Lisinopril Cough  ? Codeine Anxiety  ?  jittery   ? Morphine Anxiety  ?  jittery  ? ? ?Family History  ?Problem Relation Age of Onset  ? Heart attack Mother 62  ? Heart attack Maternal Grandmother   ? Cancer Neg Hx   ?     Breast or colon  ? Diabetes Neg Hx   ? Hypertension Neg Hx   ? Colon cancer Neg Hx   ? Colon polyps Neg Hx   ? Esophageal cancer  Neg Hx   ? Rectal cancer Neg Hx   ? Stomach cancer Neg Hx   ? ? ?Prior to Admission medications   ?Medication Sig Start Date End Date Taking? Authorizing Provider  ?acetaminophen (TYLENOL) 325 MG tablet Take 2 tablets (650 mg total) by mouth every 4 (four) hours as needed for headache or mild pain. 04/28/21  Yes Shirley Friar, PA-C  ?apixaban (ELIQUIS) 5 MG TABS tablet Take 1 tablet (5 mg total) by mouth 2 (two) times daily. 10/19/21  Yes Evans Lance, MD  ?Calcium Carb-Cholecalciferol 500-400 MG-UNIT TABS Take 1 tablet by mouth daily.   Yes [provider]  ?ferrous sulfate (SLOW RELEASE IRON) 160 (50 Fe) MG TBCR SR tablet Take 160 mg by mouth as directed.   Yes [provider]  ?Glucosamine 500 MG CAPS Take 500 mg by mouth 2 (two) times daily.   Yes [provider]  ?losartan (COZAAR) 50 MG tablet Take 1 tablet (50 mg total) by mouth daily. ?Patient taking differently: Take 50 mg by mouth every evening. 10/20/21  Yes Venia Carbon, MD  ?metoprolol succinate (TOPROL XL) 25 MG 24 hr tablet Take 1 tablet (25 mg total) by mouth at bedtime. 10/20/21  Yes Evans Lance, MD  ?Omega-3 Fatty Acids (FISH OIL) 1000 MG CAPS Take 1,000 mg by mouth 2 (two) times daily.   Yes [provider]  ?omeprazole (PRILOSEC) 20 MG capsule Take 1 capsule (20 mg total) by mouth daily. 10/20/21  Yes Venia Carbon, MD  ?Turmeric (QC TUMERIC COMPLEX PO) Take 1 capsule by mouth See admin instructions. Take 1 tablet daily when staying home and 1 tablet twice daily when traveling   Yes [provider]  ?atovaquone-proguanil (MALARONE) 250-100 MG TABS tablet Take 1 tablet by mouth daily. 10/13/21   Venia Carbon, MD  ?Multiple Vitamin (MULTIVITAMIN PO) Take 1 tablet by mouth daily.    [provider]  ? ? ?Physical Exam: ?Vitals:  ? 10/27/21 1600 10/27/21 1630 10/27/21 1700 10/27/21 1800  ?BP: (!) 160/122 (!) 156/123 (!) 162/114 (!) 166/114  ?Pulse: (!) 133 (!) 132 (!) 132  (!) 133  ?Resp: 19 (!) '29 16 14  '$ ?Temp:      ?TempSrc:      ?SpO2: 97% 99% 98% 99%  ?Weight:      ?Height:      ? ?Physical Exam ?Vitals and nursing note reviewed.  ?Constitutional:   ?   Appearance: Normal appearance.  ?HENT:  ?   Head: Normocephalic and atraumatic.  ?   Nose: Nose normal.  ?   Mouth/Throat:  ?   Mouth: Mucous membranes are moist.  ?Eyes:  ?   Pupils: Pupils are equal, round, and reactive to light.  ?Cardiovascular:  ?  Rate and Rhythm: Tachycardia present. Rhythm irregular.  ?Pulmonary:  ?   Effort: Pulmonary effort is normal.  ?   Breath sounds: Normal breath sounds.  ?Abdominal:  ?   General: Abdomen is flat. Bowel sounds are normal.  ?   Palpations: Abdomen is soft.  ?Musculoskeletal:     ?   General: Normal range of motion.  ?   Cervical back: Normal range of motion and neck supple.  ?Skin: ?   General: Skin is warm and dry.  ?Neurological:  ?   General: No focal deficit present.  ?   Mental Status: She is alert.  ?Psychiatric:     ?   Mood and Affect: Mood normal.     ?   Behavior: Behavior normal.  ? ? ?Data Reviewed: ?Relevant notes from primary care and specialist visits, past discharge summaries as available in EHR, including Care Everywhere. ?Prior diagnostic testing as pertinent to current admission diagnoses ?Updated medications and problem lists for reconciliation ?ED course, including vitals, labs, imaging, treatment and response to treatment ?Triage notes, nursing and pharmacy notes and ED provider's notes ?Notable results as noted in HPI ?Labs reviewed.  Troponin 22, sodium 140, potassium 4.2, chloride 104, bicarb 26, glucose 116, BUN 11, creatinine 1.03, calcium 8.9, white count 5.8, hemoglobin 13.7, hematocrit 41.8, RDW 13.4, platelet count 255 ?Chest x-ray reviewed by me shows faint 8 mm nodular density at the right lung apex, which may ?reflect a focus of infection or inflammation. Pulmonary nodule not excluded. Short-term follow-up radiograph is recommended to assess for  resolution. Otherwise, no acute cardiopulmonary findings. ?12 Lead EKG reviewed by me shows sinus tachycardia ? ?There are no new results to review at this time. ? ?Assessment and Plan: ?* Atrial fibrillati

## 2021-10-27 NOTE — Telephone Encounter (Signed)
Patient called stating she was in A-Fib, she wanted to know if she should go to the ER now or wait for her appt this afternoon.  Her BP was 205/138. Call transferred to nurse.  ?

## 2021-10-27 NOTE — Telephone Encounter (Signed)
Spoke with patient and she reports back on Monday she had went to ED for atrial flutter and elevated blood pressures. She went out of atrial flutter and was sent home even though her blood pressures were still elevated. Last night she started back in atrial flutter with rates in the 130's and elevated blood pressures. Requested she try to check reading while I am on the phone and blood pressure cuff went off and reported error. Had her wait a few minutes and then retry and again it said error. With last blood pressure 200/100's advised she should proceed to ED for further work up and treatment. She verbalized understanding with no further questions at this time.  ?

## 2021-10-27 NOTE — ED Notes (Signed)
 Fee, NP notified of continued heart rate of 129 while patient sleeping. No new orders received at this time. ?

## 2021-10-27 NOTE — Assessment & Plan Note (Addendum)
Blood pressure is uncontrolled. ?Patient has stage IIIa chronic kidney disease secondary to hypertension ?Continue losartan and Toprol ?Uptitrate medications to optimize blood pressure control ?

## 2021-10-27 NOTE — Assessment & Plan Note (Addendum)
Patient presents to the ER for evaluation of palpitations and has a known history of A-fib/a flutter on chronic anticoagulation therapy with Eliquis. ?Twelve-lead EKG is more consistent with sinus tachycardia on admission. ?Unclear etiology ?Patient was started on a Cardizem drip in the ER but has not responded.  We will attempt to wean off Cardizem ?We will obtain TSH levels since patient has been on amiodarone ?Obtain D-dimer levels ?Increase dose of Toprol to 50 mg daily ?Monitor patient closely ?Consult cardiology ?

## 2021-10-27 NOTE — Telephone Encounter (Signed)
Discussed briefly with provider and he agreed she should proceed to ED.  ?

## 2021-10-27 NOTE — ED Notes (Signed)
Secure chat sent to Dr Francine Graven about pt HR and BP unchanged with dilt maxed out ?

## 2021-10-27 NOTE — ED Provider Notes (Signed)
? ?[facility] ?Provider Note ? ? Event Date/Time  ? First MD Initiated Contact with Patient 10/27/21 1107   ?  (approximate) ?History  ?Palpitations and Hypertension ? ?HPI ?Denise Henson is a 76 y.o. female with stated past medical history of paroxysmal atrial flutter who presents for palpitations and hypertension.  Patient states that this is similar symptoms when she has had atrial flutter in the past.  Patient states that she normally takes metoprolol and amiodarone for the symptoms however she is taking both today and they did not resolve.  Patient does endorse mild lightheaded and dyspnea on exertion.  Patient denies any alcohol, caffeine, recent travel, sick contacts, or food out of the ordinary.  Patient currently denies any vision changes, tinnitus, difficulty speaking, facial droop, sore throat, chest pain, shortness of breath, abdominal pain, nausea/vomiting/diarrhea, dysuria, or weakness/numbness/paresthesias in any extremity ?Physical Exam  ?Triage Vital Signs: ?ED Triage Vitals  ?Enc Vitals Group  ?   BP 10/27/21 0906 (!) 164/116  ?   Pulse Rate 10/27/21 0906 (!) 132  ?   Resp 10/27/21 0906 17  ?   Temp 10/27/21 0906 98.2 ?F (36.8 ?C)  ?   Temp Source 10/27/21 0906 Oral  ?   SpO2 10/27/21 0906 95 %  ?   Weight 10/27/21 0907 190 lb (86.2 kg)  ?   Height 10/27/21 0907 '5\' 4"'$  (1.626 m)  ?   Head Circumference --   ?   Peak Flow --   ?   Pain Score 10/27/21 0907 0  ?   Pain Loc --   ?   Pain Edu? --   ?   Excl. in [place]? --   ? ?Most recent vital signs: ?Vitals:  ? 10/28/21 1230 10/28/21 1459  ?BP: 123/69 126/63  ?Pulse: (!) 50 (!) 55  ?Resp: 16 16  ?Temp:  98.2 ?F (36.8 ?C)  ?SpO2: 98% 99%  ? ?General: Awake, oriented x4. ?CV:  Good peripheral perfusion.  Tachycardic regular rhythm ?Resp:  Normal effort.  ?Abd:  No distention.  ?Other:  Elderly overweight Caucasian female laying in bed in no distress ?ED Results / Procedures / Treatments  ?Labs ?(all labs ordered are listed, but only  abnormal results are displayed) ?Labs Reviewed  ?BASIC METABOLIC PANEL - Abnormal; Notable for the following components:  ?    Result Value  ? Glucose, Bld 116 (*)   ? Creatinine, Ser 1.03 (*)   ? GFR, Estimated 56 (*)   ? All other components within normal limits  ?BASIC METABOLIC PANEL - Abnormal; Notable for the following components:  ? Glucose, Bld 170 (*)   ? All other components within normal limits  ?TROPONIN I (HIGH SENSITIVITY) - Abnormal; Notable for the following components:  ? Troponin I (High Sensitivity) 22 (*)   ? All other components within normal limits  ?CBC  ?TSH  ?D-DIMER, QUANTITATIVE  ?MAGNESIUM  ? ?EKG ?ED ECG REPORT ?I, Naaman Plummer, the attending physician, personally viewed and interpreted this ECG. ?Date: 10/27/2021 ?EKG Time: 1239 ?Rate: 133 ?Rhythm: Tachycardic sinus rhythm ?QRS Axis: normal ?Intervals: normal ?ST/T Wave abnormalities: normal ?Narrative Interpretation: Tachycardic rhythm.  No evidence of acute ischemia ?RADIOLOGY ?ED MD interpretation: 2 view chest x-ray interpreted by me shows no evidence of acute abnormalities including no pneumonia, pneumothorax, or widened mediastinum ?-Agree with radiology assessment ?Official radiology report(s): ?No results found. ?PROCEDURES: ?Critical Care performed: No ?Procedures ?MEDICATIONS ORDERED IN ED: ?Medications  ?sodium chloride 0.9 % bolus  1,000 mL (0 mLs Intravenous Stopped 10/27/21 1955)  ?diltiazem (CARDIZEM) injection 10 mg (10 mg Intravenous Given 10/27/21 1501)  ?amiodarone (NEXTERONE) 1.8 mg/mL load via infusion 150 mg (150 mg Intravenous Bolus from Bag 10/28/21 1151)  ? ?IMPRESSION / MDM / ASSESSMENT AND PLAN / ED COURSE  ?I reviewed the triage vital signs and the nursing notes. ?             ?               ? ?76 year old female presents with palpitations. ?EKG: No STEMI and no evidence of Brugada?s sign, delta wave, epsilon wave, significantly prolonged QTc, or malignant arrhythmia. ?Based on H&P and testing, this patient  appears to be low risk for emergent causes of palpitations such as, but not limited to, a malignant cardiac arrhythmia, ACS, pulmonary embolism, thyrotoxicosis, PNA, PTX. ? ?Patient given 10 mg of diltiazem as she states this is worked for her in the past. ? ?Care of this patient will be signed out to the oncoming physician at the end of my shift.  All pertinent patient information conveyed and all questions answered.  All further care and disposition decisions will be made by the oncoming physician. ? ?  ?FINAL CLINICAL IMPRESSION(S) / ED DIAGNOSES  ? ?Final diagnoses:  ?Paroxysmal atrial flutter ([place])  ? ?Rx / DC Orders  ? ?ED Discharge Orders   ? ?      Ordered  ?  amiodarone (PACERONE) 200 MG tablet  2 times daily       ? 10/28/21 1413  ?  Increase activity slowly       ? 10/28/21 1413  ?  Diet - low sodium heart healthy       ? 10/28/21 1413  ?  Discharge instructions       ?Comments: It was pleasure taking care of you. ?Your cardiologist started you on amiodarone, please take it as directed and follow-up with them closely for further recommendations. ?Keep yourself well-hydrated and continue with rest of your home meds.  ? 10/28/21 1413  ? ?  ?  ? ?  ? ?Note:  This document was prepared using Dragon voice recognition software and may include unintentional dictation errors. ?  ?Naaman Plummer, MD ?10/30/21 2048 ? ?

## 2021-10-27 NOTE — ED Notes (Signed)
Pt taken off monitoring equipment to be able to go use bathroom ?

## 2021-10-27 NOTE — ED Triage Notes (Signed)
Pt c/o having heart palpitations since last night with a hx of a-flutter, pt also concerned her b/p has been elevated, having dizziness today pt is in NAD on arrival , ambulatory with a steady gait. Denies pain ?

## 2021-10-28 ENCOUNTER — Encounter: Payer: Self-pay | Admitting: Internal Medicine

## 2021-10-28 ENCOUNTER — Other Ambulatory Visit: Payer: Self-pay

## 2021-10-28 DIAGNOSIS — I4891 Unspecified atrial fibrillation: Secondary | ICD-10-CM | POA: Diagnosis present

## 2021-10-28 DIAGNOSIS — I484 Atypical atrial flutter: Secondary | ICD-10-CM

## 2021-10-28 LAB — MAGNESIUM: Magnesium: 1.9 mg/dL (ref 1.7–2.4)

## 2021-10-28 LAB — BASIC METABOLIC PANEL
Anion gap: 8 (ref 5–15)
BUN: 12 mg/dL (ref 8–23)
CO2: 26 mmol/L (ref 22–32)
Calcium: 9 mg/dL (ref 8.9–10.3)
Chloride: 104 mmol/L (ref 98–111)
Creatinine, Ser: 0.94 mg/dL (ref 0.44–1.00)
GFR, Estimated: >60 mL/min (ref >60)
Glucose, Bld: 170 mg/dL — ABNORMAL HIGH (ref 70–99)
Potassium: 3.9 mmol/L (ref 3.5–5.1)
Sodium: 138 mmol/L (ref 135–145)

## 2021-10-28 MED ORDER — AMIODARONE HCL IN DEXTROSE 360-4.14 MG/200ML-% IV SOLN
60.0000 mg/h | INTRAVENOUS | Status: DC
  Administered 2021-10-28: 60 mg/h via INTRAVENOUS
  Filled 2021-10-28 (×2): qty 200

## 2021-10-28 MED ORDER — AMIODARONE LOAD VIA INFUSION
150.0000 mg | Freq: Once | INTRAVENOUS | Status: AC
  Administered 2021-10-28: 150 mg via INTRAVENOUS
  Filled 2021-10-28: qty 83.34

## 2021-10-28 MED ORDER — AMIODARONE HCL 200 MG PO TABS: 200.0000 mg | ORAL_TABLET | Freq: Two times a day (BID) | ORAL | 0 refills | Status: DC

## 2021-10-28 MED ORDER — AMIODARONE HCL 200 MG PO TABS: 200.0000 mg | ORAL_TABLET | Freq: Two times a day (BID) | ORAL | Status: DC

## 2021-10-28 MED ORDER — METOPROLOL SUCCINATE ER 50 MG PO TB24: 25.0000 mg | ORAL_TABLET | Freq: Every day | ORAL | Status: DC

## 2021-10-28 MED ORDER — AMIODARONE HCL IN DEXTROSE 360-4.14 MG/200ML-% IV SOLN: 30.0000 mg/h | INTRAVENOUS | Status: DC

## 2021-10-28 NOTE — TOC CM/SW Note (Signed)
Patient has orders to discharge home today. Chart reviewed. PCP is Viviana Simpler, MD. On room air. No wounds. No TOC needs identified. CSW signing off. ? ?Denise Henson, [place] ?[phone] ? ?

## 2021-10-28 NOTE — Progress Notes (Signed)
Admission profile updated. ?

## 2021-10-28 NOTE — Consult Note (Signed)
? ?Cardiology Consult  ?  ?Patient ID: Denise Henson ?MRN: [id number], DOB/AGE: January 19, 1946  ? ?Admit date: 10/27/2021 ?Date of Consult: 10/28/2021 ? ?Primary Physician: Venia Carbon, MD ?Primary Cardiologist: Cristopher Peru, MD ?Requesting Provider: S. Reesa Chew, MD ? ?Patient Profile  ?  ?CERENITI CURB is a 76 y.o. female with a history of atypical atrial flutter, CAD status post CABG x1, hypertension, hyperlipidemia, and arthritis, who is being seen today for the evaluation of recurrent rapid atrial flutter at the request of Dr. Reesa Chew. ? ?Past Medical History  ? ?Past Medical History:  ?Diagnosis Date  ? Allergic rhinitis due to pollen   ? Allergy   ? Anemia   ? Arthritis   ? Asthma   ? as a child  ? Atypical atrial flutter ([place])   ? a. 03/2021 EPS: L sided Aflutter; b. 04/2021 s/p DCCV; c. CHA2DS2VASc = 5-->eliquis. Has been using amio prn.  ? Coronary artery disease   ? a. 2007 CABG x 1: LIMA-LAD ([place]); b. 12/2017 MV: Neg.  ? GERD (gastroesophageal reflux disease)   ? Hx of pleural effusion 2007  ? after CABG  ? Hyperlipidemia   ? Unable to tolerate simvastatin 40 due to myalgias  ? Hypertension   ? Mild Aortic insufficiency   ? a. 04/2021 Echo: EF 50-55%, no rwma, mild LVH, nl RV fxn, mild BAE, mild MR/AI.  ? Mild Mitral regurgitation   ?  ?Past Surgical History:  ?Procedure Laterality Date  ? ATRIAL TACH ABLATION N/A 04/25/2021  ? Procedure: ATRIAL TACH ABLATION;  Surgeon: Evans Lance, MD;  Location: [facility];  Service: Cardiovascular;  Laterality: N/A;  ? CARDIOVERSION N/A 04/27/2021  ? Procedure: CARDIOVERSION;  Surgeon: Sanda Klein, MD;  Location: [facility];  Service: Cardiovascular;  Laterality: N/A;  ? CESAREAN SECTION    ? CHOLECYSTECTOMY    ? COLONOSCOPY    ? CORONARY ARTERY BYPASS GRAFT  5/07  ? Readmitted for post op pleural effusions  ? DOPPLER ECHOCARDIOGRAPHY    ? NV LV EF 1+ AI/MR  ? EXCISION METACARPAL MASS Left 02/21/2018  ? Procedure: EXCISION METACARPAL  MASS;  Surgeon: Hessie Knows, MD;  Location: [facility];  Service: Orthopedics;  Laterality: Left;  ? Stress imaging  6/07  ? Negative EF 67%  ? TEE WITHOUT CARDIOVERSION N/A 04/27/2021  ? Procedure: TRANSESOPHAGEAL ECHOCARDIOGRAM (TEE);  Surgeon: Sanda Klein, MD;  Location: [facility];  Service: Cardiovascular;  Laterality: N/A;  ? TONSILLECTOMY    ?  ? ?Allergies ? ?Allergies  ?Allergen Reactions  ? Lisinopril Cough  ? Codeine Anxiety  ?  jittery   ? Morphine Anxiety  ?  jittery  ? ? ?History of Present Illness  ?  ?76 year old female with the above past medical history including atypical atrial flutter, CAD, hypertension, hyperlipidemia, and arthritis.  She previously underwent CABG x1 in 2007, and subsequently had a negative Myoview in 2019.  In October 2022, she was seen by her primary care provider was noted to be tachycardic with concern for SVT.  She was referred to Dr. Lovena Le and subsequently underwent electrophysiology study with plan for catheter ablation however, upon EP study, her SVT was found to be a left atrial flutter, which was not amenable to ablation.  She was placed on oral anticoagulation and amiodarone, and subsequently underwent TEE and cardioversion in early November 2022.  Out of concern for side effects, patient has been elective to take daily amiodarone and instead, has been  using it on an as-needed basis.  She went 4 months without requiring a single dose, until mid April, when she took a dose secondary to elevated heart rate noted at home, with subsequent improvement.   ? ?She checks her pulse twice a day, as she is otherwise asymptomatic when in atrial flutter.  On the evening of April 30, she noted elevated heart rates and took a dose of amiodarone without improvement.  She presented to the emergency department the following day and was treated with intravenous diltiazem with conversion to sinus rhythm.  She was discharged home on May 1 and she notes that her heart rate had been  trending in the 60s until the morning of May 4, when she noted recurrent tachycardia on her pulse oximeter.  She also had elevated blood pressures.  She contacted our office and was advised to present to the ED.  Here, she has been treated with additional metoprolol (usually takes 25 mg daily), and intravenous diltiazem with improvement in blood pressure however, heart rates have remained elevated in the 130s, and atrial flutter.  She is asymptomatic. ? ?Inpatient Medications  ?  ? amiodarone  150 mg Intravenous Once  ? apixaban  5 mg Oral BID  ? calcium-vitamin D  1 tablet Oral Daily  ? ferrous sulfate  325 mg Oral Q breakfast  ? losartan  50 mg Oral QPM  ? metoprolol succinate  50 mg Oral Daily  ? multivitamin with minerals  1 tablet Oral Daily  ? omega-3 acid ethyl esters  1 g Oral BID  ? pantoprazole  40 mg Oral Daily  ? ? ?Family History  ?  ?Family History  ?Problem Relation Age of Onset  ? Heart attack Mother 38  ? Heart attack Maternal Grandmother   ? Cancer Neg Hx   ?     Breast or colon  ? Diabetes Neg Hx   ? Hypertension Neg Hx   ? Colon cancer Neg Hx   ? Colon polyps Neg Hx   ? Esophageal cancer Neg Hx   ? Rectal cancer Neg Hx   ? Stomach cancer Neg Hx   ? ?She indicated that her mother is deceased. She indicated that her father is deceased. She indicated that her maternal grandmother is deceased. She indicated that the status of her neg hx is unknown. ? ? ?Social History  ?  ?Social History  ? ?Socioeconomic History  ? Marital status: Widowed  ?  Spouse name: Not on file  ? Number of children: 1  ? Years of education: Not on file  ? Highest education level: Not on file  ?Occupational History  ? 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  Employer: RETIRED  ?  Comment: Retired  ?Tobacco Use  ? Smoking status: Former  ?  Types: Cigarettes  ?  Quit date: 06/26/1994  ?  Years since quitting: 27.3  ? Smokeless tobacco: Never  ?Vaping Use  ? Vaping Use: Never used  ?Substance and Sexual Activity  ? Alcohol use:  Yes  ?  Comment: one daily  ? Drug use: No  ? Sexual activity: Never  ?Other Topics Concern  ? Not on file  ?Social History Narrative  ? Has living will  ? Son [name], is health care POA  ? Would accept resuscitation attempts  ? No tube feeds if cognitively unaware  ? ?Social Determinants of Health  ? ?Financial Resource Strain: Not on file  ?Food Insecurity: Not on file  ?Transportation  Needs: Not on file  ?Physical Activity: Not on file  ?Stress: Not on file  ?Social Connections: Not on file  ?Intimate Partner Violence: Not on file  ?  ? ?Review of Systems  ?  ?General:  No chills, fever, night sweats or weight changes.  ?Cardiovascular:  No chest pain, dyspnea on exertion, edema, orthopnea, palpitations, paroxysmal nocturnal dyspnea. ?Dermatological: No rash, lesions/masses ?Respiratory: No cough, dyspnea ?Urologic: No hematuria, dysuria ?Abdominal:   No nausea, vomiting, diarrhea, bright red blood per rectum, melena, or hematemesis ?Neurologic:  No visual changes, wkns, changes in mental status. ?All other systems reviewed and are otherwise negative except as noted above. ? ?Physical Exam  ?  ?Blood pressure 128/83, pulse (!) 133, temperature 98.2 ?F (36.8 ?C), temperature source Oral, resp. rate 14, height '5\' 4"'$  (1.626 m), weight 86.2 kg, SpO2 97 %.  ?General: Pleasant, NAD ?Psych: Normal affect. ?Neuro: Alert and oriented X 3. Moves all extremities spontaneously. ?HEENT: Normal  ?Neck: Supple without bruits or JVD. ?Lungs:  Resp regular and unlabored, CTA. ?Heart: RRR, tachycardic, no s3, s4, or murmurs. ?Abdomen: Soft, non-tender, non-distended, BS + x 4.  ?Extremities: No clubbing, cyanosis. 1+ bilat LE edema. DP/PT2+, Radials 2+ and equal bilaterally. ? ?Labs  ?  ?Cardiac Enzymes ?Recent Labs  ?Lab 10/24/21 ?0955 10/27/21 ?0910  ?TROPONINIHS 34* 22*  ?   ? ?Lab Results  ?Component Value Date  ? WBC 5.8 10/27/2021  ? HGB 13.7 10/27/2021  ? HCT 41.8 10/27/2021  ? MCV 97.2 10/27/2021  ? PLT 255  10/27/2021  ?  ?Recent Labs  ?Lab 10/24/21 ?0955 10/27/21 ?2376 10/28/21 ?2831  ?NA 137   < > 138  ?K 4.3   < > 3.9  ?CL 104   < > 104  ?CO2 24   < > 26  ?BUN 18   < > 12  ?CREATININE 0.99   < > 0.94  ?CALCIUM 9.

## 2021-10-28 NOTE — ED Notes (Signed)
Pt HR between 90-110. MD made aware I didn't feel the bolus needed at this time. Messaged MD. He advised to "give bolus anyways". ?

## 2021-10-28 NOTE — Progress Notes (Signed)
?  Amiodarone Drug - Drug Interaction Consult Note ? ?Recommendations: ? No medication adjustments recommended at this time ? ?Amiodarone is metabolized by the cytochrome P450 system and therefore has the potential to cause many drug interactions. Amiodarone has an average plasma half-life of 50 days (range 20 to 100 days).  ? ?There is potential for drug interactions to occur several weeks or months after stopping treatment and the onset of drug interactions may be slow after initiating amiodarone.  ? ?'[]'$  Statins: Increased risk of myopathy. Simvastatin- restrict dose to '20mg'$  daily. ?Other statins: counsel patients to report any muscle pain or weakness immediately. ? ?'[]'$  Anticoagulants: Amiodarone can increase anticoagulant effect. Consider warfarin dose reduction. Patients should be monitored closely and the dose of anticoagulant altered accordingly, remembering that amiodarone levels take several weeks to stabilize. ? ?'[]'$  Antiepileptics: Amiodarone can increase plasma concentration of phenytoin, the dose should be reduced. Note that small changes in phenytoin dose can result in large changes in levels. Monitor patient and counsel on signs of toxicity. ? ?'[x]'$  Beta blockers: increased risk of bradycardia, AV block and myocardial depression. Sotalol - avoid concomitant use. ?Pt on metoprolol ? ?'[]'$   Calcium channel blockers (diltiazem and verapamil): increased risk of bradycardia, AV block and myocardial depression. ? ?'[]'$   Cyclosporine: Amiodarone increases levels of cyclosporine. Reduced dose of cyclosporine is recommended. ? ?'[]'$  Digoxin dose should be halved when amiodarone is started. ? ?'[]'$  Diuretics: increased risk of cardiotoxicity if hypokalemia occurs. ? ?'[]'$  Oral hypoglycemic agents (glyburide, glipizide, glimepiride): increased risk of hypoglycemia. Patient's glucose levels should be monitored closely when initiating amiodarone therapy.  ? ?'[x]'$  Drugs that prolong the QT interval:  Torsades de pointes risk may  be increased with concurrent use - avoid if possible.  Monitor QTc, also keep magnesium/potassium WNL if concurrent therapy can't be avoided. ? Antibiotics: e.g. fluoroquinolones, erythromycin. ? Antiarrhythmics: e.g. quinidine, procainamide, disopyramide, sotalol. ? Antipsychotics: e.g. phenothiazines, haloperidol. ?  Lithium, tricyclic antidepressants, and methadone. ?- Ondansetron ? ? ? ?Thank You,  ?Elina Streng A  ?10/28/2021 10:46 AM ? ? ?  ?

## 2021-10-28 NOTE — ED Notes (Signed)
RN to bedside to answer call bell. After AMIO bolus pt HR dropped drastically to 45 and pt is feeling sick at this time. AMIO stopped and MD made aware,.  ?

## 2021-10-28 NOTE — Hospital Course (Addendum)
Taken from H&P. ? ?Denise Henson is a 76 y.o. female with medical history significant for atrial flutter on chronic anticoagulation therapy, history of GERD, coronary artery disease, dyslipidemia who presents to the ER for the second time in 1 week for evaluation of palpitations and dizziness. ?She was seen in the ER on 05/01 for palpitations and hypertension.  She had taken her amiodarone prior to coming to the ER and received a dose of IV Cardizem.  She did convert to sinus rhythm and felt well enough to be discharged home to follow-up with her cardiologist as an outpatient. ?She states that since her ER visit she has continued to have intermittent palpitations and her blood pressure has remained elevated.  She is supposed to be on amiodarone but only takes it as needed because of it's numerous side effects. ?She denies having any chest pain, no shortness of breath, no nausea, no vomiting, no diaphoresis, no abdominal pain, no changes in her bowel habits, no urinary symptoms, no cough, no fever, no chills, blurred vision no focal deficit. ?She did have an upper respiratory tract infection several weeks ago but states that she has recovered from it. ?She was noted to be in rapid A-fib upon arrival to the ER and despite IV Cardizem bolus has remained tachycardic and so was started on a Cardizem drip and will be admitted to the hospital for further evaluation. ? ?Patient remained quite tachycardic.  History of prior cardioversion. ?Cardiology was consulted. ?Cardiology suggested switching Cardizem infusion with amiodarone.  She was given a bolus and converted immediately back to sinus rhythm with mild bradycardia which slowly improves. ? ?Cardiology started her on p.o. amiodarone and she will follow-up with them as an outpatient for further recommendations. ? ?Patient feels much improved and would like to go back home so she can go on her trip tomorrow morning. ? ?She will continue with rest of her home  medications and will follow-up with her providers. ?

## 2021-10-28 NOTE — Care Management CC44 (Signed)
Condition Code 44 Documentation Completed ? ?Patient Details  ?Name: Denise Henson ?MRN: [id number] ?Date of Birth: 1945-09-28 ? ? ?Condition Code 44 given:  Yes ?Patient signature on Condition Code 44 notice:  Yes ?Documentation of 2 MD's agreement:  Yes ?Code 44 added to claim:  Yes ? ? ? ?Candie Chroman, LCSW ?10/28/2021, 1:54 PM ? ?

## 2021-10-28 NOTE — Care Management Obs Status (Signed)
MEDICARE OBSERVATION STATUS NOTIFICATION ? ? ?Patient Details  ?Name: Denise Henson ?MRN: [id number] ?Date of Birth: 05-28-1946 ? ? ?Medicare Observation Status Notification Given:  Yes ? ? ? ?Candie Chroman, LCSW ?10/28/2021, 1:54 PM ?

## 2021-10-28 NOTE — ED Notes (Signed)
MD at bedside. 

## 2021-10-28 NOTE — Discharge Summary (Signed)
?Physician Discharge Summary ?  ?Patient: Denise Henson MRN: [id number] DOB: [birth year]  ?Admit date:     10/27/2021  ?Discharge date: 10/28/21  ?Discharge Physician: Lorella Nimrod  ? ?PCP: Venia Carbon, MD  ? ?Recommendations at discharge:  ?Please obtain CBC and BMP in a week ?Follow-up with primary care provider ?Follow-up with cardiology ? ?Discharge Diagnoses: ?Principal Problem: ?  Atrial fibrillation with rapid ventricular response ([place]) ?Active Problems: ?  Atrial flutter ([place]) ?  Essential hypertension, benign ?  Stage 3a chronic kidney disease ([place]) ?  Atrial fibrillation with RVR ([place]) ? ? ?Hospital Course: ?Taken from H&P. ? ?Denise Henson is a 76 y.o. female with medical history significant for atrial flutter on chronic anticoagulation therapy, history of GERD, coronary artery disease, dyslipidemia who presents to the ER for the second time in 1 week for evaluation of palpitations and dizziness. ?She was seen in the ER on 05/01 for palpitations and hypertension.  She had taken her amiodarone prior to coming to the ER and received a dose of IV Cardizem.  She did convert to sinus rhythm and felt well enough to be discharged home to follow-up with her cardiologist as an outpatient. ?She states that since her ER visit she has continued to have intermittent palpitations and her blood pressure has remained elevated.  She is supposed to be on amiodarone but only takes it as needed because of it's numerous side effects. ?She denies having any chest pain, no shortness of breath, no nausea, no vomiting, no diaphoresis, no abdominal pain, no changes in her bowel habits, no urinary symptoms, no cough, no fever, no chills, blurred vision no focal deficit. ?She did have an upper respiratory tract infection several weeks ago but states that she has recovered from it. ?She was noted to be in rapid A-fib upon arrival to the ER and despite IV Cardizem bolus has remained tachycardic and so was started on a Cardizem  drip and will be admitted to the hospital for further evaluation. ? ?Patient remained quite tachycardic.  History of prior cardioversion. ?Cardiology was consulted. ?Cardiology suggested switching Cardizem infusion with amiodarone.  She was given a bolus and converted immediately back to sinus rhythm with mild bradycardia which slowly improves. ? ?Cardiology started her on p.o. amiodarone and she will follow-up with them as an outpatient for further recommendations. ? ?Patient feels much improved and would like to go back home so she can go on her trip tomorrow morning. ? ?She will continue with rest of her home medications and will follow-up with her providers. ? ?Assessment and Plan: ?* Atrial fibrillation with rapid ventricular response ([place]) ?Patient presents to the ER for evaluation of palpitations and has a known history of A-fib/a flutter on chronic anticoagulation therapy with Eliquis. ?Twelve-lead EKG is more consistent with sinus tachycardia on admission. ?Unclear etiology ?Patient was started on a Cardizem drip in the ER but has not responded.  We will attempt to wean off Cardizem ?We will obtain TSH levels since patient has been on amiodarone ?Obtain D-dimer levels ?Increase dose of Toprol to 50 mg daily ?Monitor patient closely ?Consult cardiology ? ?Atrial flutter ([place]) ?Treatment as outlined in 1 ? ?Essential hypertension, benign ?Blood pressure is uncontrolled. ?Patient has stage IIIa chronic kidney disease secondary to hypertension ?Continue losartan and Toprol ?Uptitrate medications to optimize blood pressure control ? ? ?Consultants: Cardiology ?Procedures performed: None ?Disposition: Home ?Diet recommendation:  ?Discharge Diet Orders (From admission, onward)  ? ?  Start  Ordered  ? 10/28/21 0000  Diet - low sodium heart healthy       ? 10/28/21 1413  ? ?  ?  ? ?  ? ?Cardiac diet ?DISCHARGE MEDICATION: ?Allergies as of 10/28/2021   ? ?   Reactions  ? Lisinopril Cough  ? Codeine Anxiety  ?  jittery   ? Morphine Anxiety  ? jittery  ? ?  ? ?  ?Medication List  ?  ? ?TAKE these medications   ? ?acetaminophen 325 MG tablet ?Commonly known as: TYLENOL ?Take 2 tablets (650 mg total) by mouth every 4 (four) hours as needed for headache or mild pain. ?  ?amiodarone 200 MG tablet ?Commonly known as: PACERONE ?Take 1 tablet (200 mg total) by mouth 2 (two) times daily. ?  ?apixaban 5 MG Tabs tablet ?Commonly known as: ELIQUIS ?Take 1 tablet (5 mg total) by mouth 2 (two) times daily. ?  ?atovaquone-proguanil 250-100 MG Tabs tablet ?Commonly known as: MALARONE ?Take 1 tablet by mouth daily. ?  ?Calcium Carb-Cholecalciferol 500-400 MG-UNIT Tabs ?Take 1 tablet by mouth daily. ?  ?Fish Oil 1000 MG Caps ?Take 1,000 mg by mouth 2 (two) times daily. ?  ?Glucosamine 500 MG Caps ?Take 500 mg by mouth 2 (two) times daily. ?  ?losartan 50 MG tablet ?Commonly known as: COZAAR ?Take 1 tablet (50 mg total) by mouth daily. ?What changed: when to take this ?  ?metoprolol succinate 25 MG 24 hr tablet ?Commonly known as: Toprol XL ?Take 1 tablet (25 mg total) by mouth at bedtime. ?  ?MULTIVITAMIN PO ?Take 1 tablet by mouth daily. ?  ?omeprazole 20 MG capsule ?Commonly known as: PRILOSEC ?Take 1 capsule (20 mg total) by mouth daily. ?  ?QC TUMERIC COMPLEX PO ?Take 1 capsule by mouth See admin instructions. Take 1 tablet daily when staying home and 1 tablet twice daily when traveling ?  ?Slow Release Iron 160 (50 Fe) MG Tbcr SR tablet ?Generic drug: ferrous sulfate ?Take 160 mg by mouth as directed. ?  ? ?  ? ? Follow-up Information   ? ? Venia Carbon, MD. Schedule an appointment as soon as possible for a visit in 1 week(s).   ?Specialties: Internal Medicine, Pediatrics ?Contact information: ?[address] ?[place] [address] ?[phone] ? ? ?  ?  ? ? Evans Lance, MD .   ?Specialty: Cardiology ?Contact information: ?[address] ?[address] ?[place] [address] ?[phone] ? ? ?  ?  ? ? Evans Lance, MD. Schedule an appointment as soon as possible for a visit in 1 week(s).   ?Specialty: Cardiology ?Contact information: ?[address] ?[address] ?[place] [address] ?[phone] ? ? ?  ?  ? ?  ?  ? ?  ? ?Discharge Exam: ?Filed Weights  ? 10/27/21 9163  ?Weight: 86.2 kg  ? ?General.     In no acute distress. ?Pulmonary.  Lungs clear bilaterally, normal respiratory effort. ?CV.  Regular rate and rhythm, no JVD, rub or murmur. ?Abdomen.  Soft, nontender, nondistended, BS positive. ?CNS.  Alert and oriented .  No focal neurologic deficit. ?Extremities.  No edema, no cyanosis, pulses intact and symmetrical. ?Psychiatry.  Judgment and insight appears normal.  ? ?Condition at discharge: stable ? ?The results of significant diagnostics from this hospitalization (including imaging, microbiology, ancillary and laboratory) are listed below for reference.  ? ?Imaging Studies: ?DG Chest 2 View ? ?Result Date: 10/27/2021 ?CLINICAL DATA:  Palpitations EXAM:  CHEST - 2 VIEW COMPARISON:  10/24/2021 FINDINGS: Prior median sternotomy and CABG. Heart size within normal limits. Aortic atherosclerosis. Faint 8 mm nodular density at the right lung apex. Elsewhere, no focal airspace consolidation, pleural effusion, or pneumothorax. Degenerative changes of the thoracic spine. IMPRESSION: 1. Faint 8 mm nodular density at the right lung apex, which may reflect a focus of infection or inflammation. Pulmonary nodule not excluded. Short-term follow-up radiograph is recommended to assess for resolution. 2. Otherwise, no acute cardiopulmonary findings. Electronically Signed   By: Davina Poke D.O.   On: 10/27/2021 09:40  ? ?DG Chest 2 View ? ?Result Date: 10/24/2021 ?CLINICAL DATA:  Cough. EXAM: CHEST - 2 VIEW COMPARISON:  None. FINDINGS: The heart size and mediastinal contours are within normal limits. Both lungs are clear. Sternotomy wires are noted. The visualized skeletal structures are unremarkable. IMPRESSION: No active  cardiopulmonary disease. Electronically Signed   By: Marijo Conception M.D.   On: 10/24/2021 10:45   ? ?Microbiology: ?Results for orders placed or performed during the hospital encounter of 04/25/21  ?SARS CORONAVI

## 2021-10-28 NOTE — ED Notes (Signed)
 Fee, NP notified of sustained HR at 132. Patient denies chest pain, shortness of breath or palpations.  ?

## 2021-10-28 NOTE — ED Notes (Signed)
Per MD Pt can be DC home .  ?

## 2021-10-28 NOTE — ED Notes (Addendum)
RN  to bedside to introduce self to pt. Pt advised she has had to be cardioverted prior for the same. This was Nov of 2022. Pt HR at 135 and is asymptomatic at this point.  ?

## 2021-11-08 MED ORDER — ATOVAQUONE-PROGUANIL HCL 250-100 MG PO TABS: 1.0000 | ORAL_TABLET | Freq: Every day | ORAL | 0 refills | Status: DC

## 2021-11-09 NOTE — Progress Notes (Signed)
Cardiology Office Note Date:  11/09/2021  Patient ID:  December, Hedtke [birth year], MRN [id number] PCP:  Venia Carbon, MD  Electrophysiologist: Dr. Lovena Le   Chief Complaint:  post hospital  History of Present Illness: ALDEAN SUDDETH is a 75 y.o. female with history of CAD (CABG 2007), HTN, HLD, AFlutter (L sided by EPS), arthritis.  She comes in today to be seen for Dr. Lovena Le, last seen by him Dec 2022, she had been Rx amiodarone though only took a few doses and stopped with concerns of potential side effects after reading the package insert.  She was clear she did not want amiodarone, was fairly insistant that she be allowed to use PRN, though discussed that the medication would not work that way, planned for Tikosyn initiation.  Tikosyn not pursued ultimately  She saw E. Dick, NP 08/25/21, she reported doing well, wanted to adjuste her medicines requesting again that she use amiodarone PRN, again educated that the medication does not work that way and advised not to use it.  She inquired about stopping the Toprol and reducing her ;eliquis in 1/2.  Educated on Eliquis and dosing rational advised not to reduce in 1/2.   Discussed rate control benefits of toprol and advised not to stop it  Admitted  10/27/21 c/o palpitations and high BP, she reported taking metoprolol and amiodarone for her symptoms and had taken both that day without improvement in her symptoms. 164/116, HR 132, given dilt and amio bolus Ended up admitted with persistent tachycardic rates to IM service on a dilt gtt. Cardiology consulted, amio gtt started and had conversion to Sinus Discharged 10/28/21 Prescribed amiodarone '200mg'$  BID (Cards note discussed perhaps transitioning to tikosyn though she did not want to stay in the hospital for that, perhaps to revisit outpt)  TODAY She has been extremely worried about what she felt was a massive dosing of amiodarone. Her BP has been extremely high 200'2/100's  until  yesterday, was better, she feels 2/2 the amiodarone. HR yesterday AM was 48bpm, no particular symptoms, but an observation that has her quite worried, as well as her HR 58 today, typically her HR is about 63. She denies any AFib since the hospital She has stopped amiodarone, as of Monday 11/07/21 and does not want to use it going forward.  Very worried about potential side effects. When she is not in AFlutter she feels very well, no unusual fatigue, no dizziness, no near syncope or syncope. She is looking forward to a trip to [place] next week, will be back early July. She reports good compliance with her Eliquis, taking as directed.  She wants to pursue Tikosyn   Past Medical History:  Diagnosis Date   Allergic rhinitis due to pollen    Allergy    Anemia    Arthritis    Asthma    as a child   Atypical atrial flutter ([place])    a. 03/2021 EPS: L sided Aflutter; b. 04/2021 s/p DCCV; c. CHA2DS2VASc = 5-->eliquis. Has been using amio prn.   Coronary artery disease    a. [facility] [place] b. 12/2017 MV: Neg.   GERD (gastroesophageal reflux disease)    Hx of pleural effusion 2007   after CABG   Hyperlipidemia    Unable to tolerate simvastatin 40 due to myalgias   Hypertension    Mild Aortic insufficiency    a. 04/2021 Echo: EF 50-55%, no rwma, mild LVH, nl RV fxn,  mild BAE, mild MR/AI.   Mild Mitral regurgitation     Past Surgical History:  Procedure Laterality Date   ATRIAL TACH ABLATION N/A 04/25/2021   Procedure: ATRIAL TACH ABLATION;  Surgeon: Evans Lance, MD;  Location: [facility];  Service: Cardiovascular;  Laterality: N/A;   CARDIOVERSION N/A 04/27/2021   Procedure: CARDIOVERSION;  Surgeon: Sanda Klein, MD;  Location: [facility];  Service: Cardiovascular;  Laterality: N/A;   CESAREAN SECTION     CHOLECYSTECTOMY     COLONOSCOPY     CORONARY ARTERY BYPASS GRAFT  5/07   Readmitted for post op pleural effusions   DOPPLER  ECHOCARDIOGRAPHY     [place] LV EF 1+ AI/MR   EXCISION METACARPAL MASS Left 02/21/2018   Procedure: EXCISION METACARPAL MASS;  Surgeon: Hessie Knows, MD;  Location: [facility];  Service: Orthopedics;  Laterality: Left;   Stress imaging  6/07   Negative EF 67%   TEE WITHOUT CARDIOVERSION N/A 04/27/2021   Procedure: TRANSESOPHAGEAL ECHOCARDIOGRAM (TEE);  Surgeon: Sanda Klein, MD;  Location: [facility];  Service: Cardiovascular;  Laterality: N/A;   TONSILLECTOMY      Current Outpatient Medications  Medication Sig Dispense Refill   acetaminophen (TYLENOL) 325 MG tablet Take 2 tablets (650 mg total) by mouth every 4 (four) hours as needed for headache or mild pain.     amiodarone (PACERONE) 200 MG tablet Take 1 tablet (200 mg total) by mouth 2 (two) times daily. 60 tablet 0   apixaban (ELIQUIS) 5 MG TABS tablet Take 1 tablet (5 mg total) by mouth 2 (two) times daily. 180 tablet 1   atovaquone-proguanil (MALARONE) 250-100 MG TABS tablet Take 1 tablet by mouth daily. 14 tablet 0   Calcium Carb-Cholecalciferol 500-400 MG-UNIT TABS Take 1 tablet by mouth daily.     ferrous sulfate (SLOW RELEASE IRON) 160 (50 Fe) MG TBCR SR tablet Take 160 mg by mouth as directed.     Glucosamine 500 MG CAPS Take 500 mg by mouth 2 (two) times daily.     losartan (COZAAR) 50 MG tablet Take 1 tablet (50 mg total) by mouth daily. (Patient taking differently: Take 50 mg by mouth every evening.) 90 tablet 3   metoprolol succinate (TOPROL XL) 25 MG 24 hr tablet Take 1 tablet (25 mg total) by mouth at bedtime. 90 tablet 3   Multiple Vitamin (MULTIVITAMIN PO) Take 1 tablet by mouth daily.     Omega-3 Fatty Acids (FISH OIL) 1000 MG CAPS Take 1,000 mg by mouth 2 (two) times daily.     omeprazole (PRILOSEC) 20 MG capsule Take 1 capsule (20 mg total) by mouth daily. 90 capsule 3   Turmeric (QC TUMERIC COMPLEX PO) Take 1 capsule by mouth See admin instructions. Take 1 tablet daily when staying home and 1 tablet twice daily when  traveling     No current facility-administered medications for this visit.    Allergies:   Lisinopril, Codeine, and Morphine   Social History:  The patient  reports that she quit smoking about 27 years ago. Her smoking use included cigarettes. She has never used smokeless tobacco. She reports current alcohol use. She reports that she does not use drugs.   Family History:  The patient's family history includes Heart attack in her maternal grandmother; Heart attack (age of onset: 77) in her mother.  ROS:  Please see the history of present illness.    All other systems are reviewed and otherwise negative.   PHYSICAL EXAM:  VS:  There were no vitals taken for this visit. BMI: There is no height or weight on file to calculate BMI. Well nourished, well developed, in no acute distress HEENT: normocephalic, atraumatic Neck: no JVD, carotid bruits or masses Cardiac:  RRR; no significant murmurs, no rubs, or gallops Lungs:  CTA b/l, no wheezing, rhonchi or rales Abd: soft, nontender MS: no deformity or atrophy Ext:  no edema Skin: warm and dry, no rash Neuro:  No gross deficits appreciated Psych: euthymic mood, full affect   EKG:  Done today and reviewed by myself shows  SB 54bpm, normal intervals   04/27/2021; TTE  1. Left ventricular ejection fraction, by estimation, is 50 to 55%. The  left ventricle has low normal function. The left ventricle has no regional  wall motion abnormalities. There is mild left ventricular hypertrophy.  Indeterminate diastolic filling due   to E-A fusion.   2. Right ventricular systolic function is mildly reduced. The right  ventricular size is normal. There is normal pulmonary artery systolic  pressure.   3. Left atrial size was mildly dilated.   4. Right atrial size was mildly dilated.   5. The mitral valve is normal in structure. Mild mitral valve  regurgitation.   6. The aortic valve is normal in structure. Aortic valve regurgitation is  mild.  Mild aortic valve sclerosis is present, with no evidence of aortic  valve stenosis.   7. The inferior vena cava is normal in size with greater than 50%  respiratory variability, suggesting right atrial pressure of 3 mmHg.   04/25/2021; EPS Conclusion: Invasive insertion of catheters in the right atrium, left atrium, and right ventricle demonstrating atypical left atrial flutter with 2 1 AV conduction which was not evident at the time of recording of the baseline twelve-lead EKG   Recent Labs: 10/24/2021: ALT 34 10/27/2021: Hemoglobin 13.7; Platelets 255; TSH 2.682 10/28/2021: BUN 12; Creatinine, Ser 0.94; Magnesium 1.9; Potassium 3.9; Sodium 138  04/15/2021: Cholesterol 190; HDL 68.10; LDL Cholesterol 97; Total CHOL/HDL Ratio 3; Triglycerides 123.0; VLDL 24.6   Estimated Creatinine Clearance: 54.1 mL/min (by C-G formula based on SCr of 0.94 mg/dL).   Wt Readings from Last 3 Encounters:  10/27/21 190 lb (86.2 kg)  10/24/21 190 lb (86.2 kg)  08/25/21 182 lb (82.6 kg)     Other studies reviewed: Additional studies/records reviewed today include: summarized above  ASSESSMENT AND PLAN:  Atypical (L sided) AFlutter CHA2DS2Vasc is 5, on eliquis,  appropriately dosed  Stop amiodarone, I have advised her not to take at all, not PRN as she had been otherwise we won't be able to use Tikosyn Discussed Tikosyn admission, rational for hospital monitoring during load with potyential for potential arrhythmia that could be life threatening.  Will get an amiodarone level, she was not on much for very long Will have her see the AFib c linic July after she returns  She gets fast in AFlutter Told her she could use a PRN 1/2 tab of her Toprol if she has fast Aflutter in [place], though also advised that she is aware of how to gain access to health care of she needs Discussed baseline bradycardia and not to use more then a single dose of the 1/2 tab, if not helping and/or feeling poorly would need to be  seen.   CAD She was told years ago by her prior cardiologist that she no longer needed to see a cardiologist given the stability of her CABG/CAD. Her PMD monitors and manages her lipids,  and in d/w him, is OFF statin, mentions the statin made her muscles ache.  HTN Loks great today  4. Sinus bradycardia On low dose toprol Asymptomatic No changes for now  Disposition: F/u with AFib clinic early July on her return.  Current medicines are reviewed at length with the patient today.  The patient did not have any concerns regarding medicines.  Venetia Night, PA-C 11/09/2021 11:02 AM     [facility] [address] [place] [address] [phone] (office)  [phone] (fax)

## 2021-11-10 ENCOUNTER — Ambulatory Visit: Payer: Medicare HMO | Admitting: Physician Assistant

## 2021-11-10 ENCOUNTER — Encounter: Payer: Self-pay | Admitting: Internal Medicine

## 2021-11-10 ENCOUNTER — Encounter: Payer: Self-pay | Admitting: Physician Assistant

## 2021-11-10 VITALS — BP 120/66 | HR 58 | Ht 64.0 in | Wt 192.0 lb

## 2021-11-10 DIAGNOSIS — I484 Atypical atrial flutter: Secondary | ICD-10-CM

## 2021-11-10 DIAGNOSIS — R001 Bradycardia, unspecified: Secondary | ICD-10-CM

## 2021-11-10 DIAGNOSIS — I2510 Atherosclerotic heart disease of native coronary artery without angina pectoris: Secondary | ICD-10-CM

## 2021-11-10 NOTE — Patient Instructions (Addendum)
Medication Instructions:    STOP TAKING:  AMIODARONE  *If you need a refill on your cardiac medications before your next appointment, please call your pharmacy*   Lab Work:  AMIODARONE LEVEL    If you have labs (blood work) drawn today and your tests are completely normal, you will receive your results only by: [place] (if you have MyChart) OR A paper copy in the mail If you have any lab test that is abnormal or we need to change your treatment, we will call you to review the results.   Testing/Procedures: NONE ORDERED  TODAY     Follow-Up: At [facility], you and your health needs are our priority.  As part of our continuing mission to provide you with exceptional heart care, we have created designated Provider Care Teams.  These Care Teams include your primary Cardiologist (physician) and Advanced Practice Providers (APPs -  Physician Assistants and Nurse Practitioners) who all work together to provide you with the care you need, when you need it.  We recommend signing up for the patient portal called "MyChart".  Sign up information is provided on this After Visit Summary.  MyChart is used to connect with patients for Virtual Visits (Telemedicine).  Patients are able to view lab/test results, encounter notes, upcoming appointments, etc.  Non-urgent messages can be sent to your provider as well.   To learn more about what you can do with MyChart, go to [email or web address].    Your next appointment:   2 month(s)  EARLY July DISCUSS TIKOSYN   The format for your next appointment:   In Person  Provider:  AFIB CLINIC {    Other Instructions   Important Information About Sugar

## 2021-11-18 LAB — AMIODARONE (CORDARONE), S/P
AMIODARONE: 263 ng/mL — ABNORMAL LOW (ref 1000–2500)
DESETHYLAMIODARONE: 273 ng/mL

## 2021-12-28 ENCOUNTER — Telehealth: Payer: Self-pay | Admitting: Pharmacist

## 2021-12-28 ENCOUNTER — Encounter (HOSPITAL_COMMUNITY): Payer: Self-pay | Admitting: Physician Assistant

## 2021-12-28 ENCOUNTER — Ambulatory Visit (HOSPITAL_COMMUNITY)
Admission: RE | Admit: 2021-12-28 | Discharge: 2021-12-28 | Disposition: A | Discharge status: Home / Self Care | Payer: Medicare HMO | Source: Ambulatory Visit | Attending: Physician Assistant | Admitting: Physician Assistant

## 2021-12-28 VITALS — BP 142/92 | HR 117 | Ht 64.0 in | Wt 186.6 lb

## 2021-12-28 DIAGNOSIS — I2510 Atherosclerotic heart disease of native coronary artery without angina pectoris: Secondary | ICD-10-CM | POA: Diagnosis not present

## 2021-12-28 DIAGNOSIS — I10 Essential (primary) hypertension: Secondary | ICD-10-CM | POA: Diagnosis not present

## 2021-12-28 DIAGNOSIS — D6869 Other thrombophilia: Secondary | ICD-10-CM | POA: Insufficient documentation

## 2021-12-28 DIAGNOSIS — E669 Obesity, unspecified: Secondary | ICD-10-CM | POA: Insufficient documentation

## 2021-12-28 DIAGNOSIS — Z6832 Body mass index (BMI) 32.0-32.9, adult: Secondary | ICD-10-CM | POA: Diagnosis not present

## 2021-12-28 DIAGNOSIS — I484 Atypical atrial flutter: Secondary | ICD-10-CM | POA: Diagnosis not present

## 2021-12-28 DIAGNOSIS — Z951 Presence of aortocoronary bypass graft: Secondary | ICD-10-CM | POA: Diagnosis not present

## 2021-12-28 NOTE — Progress Notes (Signed)
Primary Care Physician: Venia Carbon, MD Primary Electrophysiologist: Dr Lovena Le Referring Physician: Tommye Standard PA   Denise Henson is a 76 y.o. female with a history of CAD s/p CABG 2007, HTN, HLD, atrial flutter who presents for follow up in the [facility]. Patient had been maintained on amiodarone but patient discontinued this due to concerns about off target effects. Patient is on Eliquis for a CHADS2VASC score of 5. She is referred to the AF clinic to discuss dofetilide admission. Today, patient reports that she had done well until this morning when she noted her heart racing again. She does have intermittent "whoozy" feeling. No bleeding issues on anticoagulation.   Today, she denies symptoms of chest pain, shortness of breath, orthopnea, PND, lower extremity edema, presyncope, syncope, snoring, daytime somnolence, bleeding, or neurologic sequela. The patient is tolerating medications without difficulties and is otherwise without complaint today.    Atrial Fibrillation Risk Factors:  she does not have symptoms or diagnosis of sleep apnea. she does not have a history of rheumatic fever.   she has a BMI of Body mass index is 32.03 kg/m.Marland Kitchen Filed Weights   12/28/21 0948  Weight: 84.6 kg    Family History  Problem Relation Age of Onset   Heart attack Mother 87   Heart attack Maternal Grandmother    Cancer Neg Hx        Breast or colon   Diabetes Neg Hx    Hypertension Neg Hx    Colon cancer Neg Hx    Colon polyps Neg Hx    Esophageal cancer Neg Hx    Rectal cancer Neg Hx    Stomach cancer Neg Hx      Atrial Fibrillation Management history:  Previous antiarrhythmic drugs: amiodarone  Previous cardioversions: 04/27/21 Previous ablations: 04/25/21 Atach CHADS2VASC score: 5 Anticoagulation history: Eliquis   Past Medical History:  Diagnosis Date   Allergic rhinitis due to pollen    Allergy    Anemia    Arthritis    Asthma     as a child   Atypical atrial flutter ([place])    a. 03/2021 EPS: L sided Aflutter; b. 04/2021 s/p DCCV; c. CHA2DS2VASc = 5-->eliquis. Has been using amio prn.   Coronary artery disease    a. 2007 [facility] [place] b. 12/2017 MV: Neg.   GERD (gastroesophageal reflux disease)    Hx of pleural effusion 2007   after CABG   Hyperlipidemia    Unable to tolerate simvastatin 40 due to myalgias   Hypertension    Mild Aortic insufficiency    a. 04/2021 Echo: EF 50-55%, no rwma, mild LVH, nl RV fxn, mild BAE, mild MR/AI.   Mild Mitral regurgitation    Past Surgical History:  Procedure Laterality Date   ATRIAL TACH ABLATION N/A 04/25/2021   Procedure: ATRIAL TACH ABLATION;  Surgeon: Evans Lance, MD;  Location: [facility];  Service: Cardiovascular;  Laterality: N/A;   CARDIOVERSION N/A 04/27/2021   Procedure: CARDIOVERSION;  Surgeon: Sanda Klein, MD;  Location: [place];  Service: Cardiovascular;  Laterality: N/A;   CESAREAN SECTION     CHOLECYSTECTOMY     COLONOSCOPY     CORONARY ARTERY BYPASS GRAFT  5/07   Readmitted for post op pleural effusions   DOPPLER ECHOCARDIOGRAPHY     NV LV EF 1+ AI/MR   EXCISION METACARPAL MASS Left 02/21/2018   Procedure: EXCISION METACARPAL MASS;  Surgeon: Rudene Christians,  Legrand Como, MD;  Location: [facility];  Service: Orthopedics;  Laterality: Left;   Stress imaging  6/07   Negative EF 67%   TEE WITHOUT CARDIOVERSION N/A 04/27/2021   Procedure: TRANSESOPHAGEAL ECHOCARDIOGRAM (TEE);  Surgeon: Sanda Klein, MD;  [facility];  Service: Cardiovascular;  Laterality: N/A;   TONSILLECTOMY      Current Outpatient Medications  Medication Sig Dispense Refill   acetaminophen (TYLENOL) 325 MG tablet Take 2 tablets (650 mg total) by mouth every 4 (four) hours as needed for headache or mild pain.     apixaban (ELIQUIS) 5 MG TABS tablet Take 1 tablet (5 mg total) by mouth 2 (two) times daily. 180 tablet 1   Calcium  Carb-Cholecalciferol 500-400 MG-UNIT TABS Take 1 tablet by mouth daily.     ferrous sulfate (SLOW RELEASE IRON) 160 (50 Fe) MG TBCR SR tablet Take 160 mg by mouth 3 (three) times a week.     Glucosamine 500 MG CAPS Take 500 mg by mouth daily.     losartan (COZAAR) 50 MG tablet Take 1 tablet (50 mg total) by mouth daily. 90 tablet 3   metoprolol succinate (TOPROL XL) 25 MG 24 hr tablet Take 1 tablet (25 mg total) by mouth at bedtime. 90 tablet 3   Omega-3 Fatty Acids (FISH OIL) 1000 MG CAPS Take 1,000 mg by mouth daily.     Turmeric (QC TUMERIC COMPLEX PO) Take 1 capsule by mouth See admin instructions. Take 1 tablet daily when staying home and 1 tablet twice daily when traveling     No current facility-administered medications for this encounter.    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[place])    Employer: RETIRED    Comment: Retired  Tobacco Use   Smoking status: Former    Types: Cigarettes    Quit date: 06/26/1994    Years since quitting: 27.5   Smokeless tobacco: Never   Tobacco comments:    Former smoker 12/28/21  Vaping Use   Vaping Use: Never used  Substance and Sexual Activity   Alcohol use: Yes    Alcohol/week: 7.0 - 14.0 standard drinks of alcohol    Types: 7 - 14 Standard drinks or equivalent per week    Comment: 1-2 drinks daily 12/28/21   Drug use: No   Sexual activity: Never  Other Topics Concern   Not on file  Social History Narrative   Has living will   Son [name], is health care POA   Would accept resuscitation attempts   No tube feeds if cognitively unaware   Social Determinants of Health   Financial Resource Strain: Not on  file  Food Insecurity: Not on file  Transportation Needs: Not on file  Physical Activity: Not on file  Stress: Not on file  Social Connections: Not on file  Intimate Partner Violence: Not on file     ROS- All systems are reviewed and negative except as per the HPI above.  Physical Exam: Vitals:   12/28/21 0948  BP: (!) 142/92  Pulse: (!) 117  Weight: 84.6 kg  Height:  $'5\' 4"'c$  (1.626 m)    GEN- The patient is a well appearing elderly obesefemale, alert and oriented x 3 today.   Head- normocephalic, atraumatic Eyes-  Sclera clear, conjunctiva pink Ears- hearing intact Oropharynx- clear Neck- supple  Lungs- Clear to ausculation bilaterally, normal work of breathing Heart- Regular rate and rhythm, tachycardia, no murmurs, rubs or gallops  GI- soft, NT, ND, + BS Extremities- no clubbing, cyanosis, or edema MS- no significant deformity or atrophy Skin- no rash or lesion Psych- euthymic mood, full affect Neuro- strength and sensation are intact  Wt Readings from Last 3 Encounters:  12/28/21 84.6 kg  11/10/21 87.1 kg  10/27/21 86.2 kg    EKG today demonstrates  Atypical atrial flutter with 2:1 block Vent. rate 117 BPM PR interval 206 ms QRS duration 86 ms QT/QTcB 292/407 ms  Echo 04/27/21 demonstrated   1. Left ventricular ejection fraction, by estimation, is 50 to 55%. The  left ventricle has low normal function. The left ventricle has no regional  wall motion abnormalities. There is mild left ventricular hypertrophy.  Indeterminate diastolic filling due to E-A fusion.   2. Right ventricular systolic function is mildly reduced. The right  ventricular size is normal. There is normal pulmonary artery systolic  pressure.   3. Left atrial size was mildly dilated.   4. Right atrial size was mildly dilated.   5. The mitral valve is normal in structure. Mild mitral valve  regurgitation.   6. The aortic valve is normal in structure. Aortic valve regurgitation is  mild. Mild  aortic valve sclerosis is present, with no evidence of aortic  valve stenosis.   7. The inferior vena cava is normal in size with greater than 50%  respiratory variability, suggesting right atrial pressure of 3 mmHg.   Epic records are reviewed at length today  CHA2DS2-VASc Score = 5  The patient's score is based upon: CHF History: 0 HTN History: 1 Diabetes History: 0 Stroke History: 0 Vascular Disease History: 1 Age Score: 2 Gender Score: 1       ASSESSMENT AND PLAN: 1. Atypical atrial flutter The patient's CHA2DS2-VASc score is 5, indicating a 7.2% annual risk of stroke.   Patient in atypical atrial flutter today. Patient wants to pursue dofetilide. Continue Eliquis 5 mg BID, states no missed doses in the last 3 weeks. PharmD to screen medications QTc in SR 417 ms Increase Toprol to 12.5 mg AM and 25 mg PM  2. Secondary Hypercoagulable State (ICD10:  D68.69) The patient is at significant risk for stroke/thromboembolism based upon her CHA2DS2-VASc Score of 5.  Continue Apixaban (Eliquis).   3. Obesity Body mass index is 32.03 kg/m. Lifestyle modification was discussed at length including regular exercise and weight reduction.  4. CAD S/p CABG No anginal symptoms.  5. HTN Stable, increase BB as above. Typically well controlled in SR.   Follow up in the AF clinic for dofetilide loading next week.    [facility] [address] [place], [place] [address] [phone] 12/28/2021 9:56 AM

## 2021-12-28 NOTE — Telephone Encounter (Signed)
Medication list reviewed in anticipation of upcoming Tikosyn initiation. Patient is not taking any contraindicated or QTc prolonging medications.   Patient is anticoagulated on Eliquis 5mg BID on the appropriate dose. Please ensure that patient has not missed any anticoagulation doses in the 3 weeks prior to Tikosyn initiation.   Patient will need to be counseled to avoid use of Benadryl while on Tikosyn and in the 2-3 days prior to Tikosyn initiation. 

## 2022-01-02 ENCOUNTER — Other Ambulatory Visit (HOSPITAL_COMMUNITY): Payer: Self-pay

## 2022-01-02 ENCOUNTER — Ambulatory Visit (HOSPITAL_COMMUNITY)
Admission: RE | Admit: 2022-01-02 | Discharge: 2022-01-02 | Disposition: A | Discharge status: Home / Self Care | Payer: Medicare HMO | Source: Ambulatory Visit | Attending: Physician Assistant | Admitting: Physician Assistant

## 2022-01-02 ENCOUNTER — Encounter (HOSPITAL_COMMUNITY): Payer: Self-pay | Admitting: Internal Medicine

## 2022-01-02 ENCOUNTER — Other Ambulatory Visit: Payer: Self-pay

## 2022-01-02 ENCOUNTER — Encounter (HOSPITAL_COMMUNITY): Payer: Self-pay | Admitting: Physician Assistant

## 2022-01-02 ENCOUNTER — Inpatient Hospital Stay (HOSPITAL_COMMUNITY)
Admission: AD | Admit: 2022-01-02 | Discharge: 2022-01-05 | DRG: 309 | Disposition: A | Discharge status: Home / Self Care | Payer: Medicare HMO | Source: Ambulatory Visit | Attending: Internal Medicine | Admitting: Internal Medicine

## 2022-01-02 VITALS — BP 134/72 | HR 63 | Ht 64.0 in | Wt 187.6 lb

## 2022-01-02 DIAGNOSIS — I10 Essential (primary) hypertension: Secondary | ICD-10-CM | POA: Diagnosis present

## 2022-01-02 DIAGNOSIS — J301 Allergic rhinitis due to pollen: Secondary | ICD-10-CM | POA: Diagnosis not present

## 2022-01-02 DIAGNOSIS — Z79899 Other long term (current) drug therapy: Secondary | ICD-10-CM | POA: Diagnosis not present

## 2022-01-02 DIAGNOSIS — Z7901 Long term (current) use of anticoagulants: Secondary | ICD-10-CM

## 2022-01-02 DIAGNOSIS — Z87891 Personal history of nicotine dependence: Secondary | ICD-10-CM | POA: Diagnosis not present

## 2022-01-02 DIAGNOSIS — I2510 Atherosclerotic heart disease of native coronary artery without angina pectoris: Secondary | ICD-10-CM | POA: Diagnosis present

## 2022-01-02 DIAGNOSIS — D6869 Other thrombophilia: Secondary | ICD-10-CM

## 2022-01-02 DIAGNOSIS — E669 Obesity, unspecified: Secondary | ICD-10-CM | POA: Diagnosis not present

## 2022-01-02 DIAGNOSIS — R001 Bradycardia, unspecified: Secondary | ICD-10-CM | POA: Diagnosis not present

## 2022-01-02 DIAGNOSIS — Z888 Allergy status to other drugs, medicaments and biological substances status: Secondary | ICD-10-CM | POA: Diagnosis not present

## 2022-01-02 DIAGNOSIS — Z6832 Body mass index (BMI) 32.0-32.9, adult: Secondary | ICD-10-CM | POA: Diagnosis not present

## 2022-01-02 DIAGNOSIS — I484 Atypical atrial flutter: Secondary | ICD-10-CM

## 2022-01-02 DIAGNOSIS — Z8249 Family history of ischemic heart disease and other diseases of the circulatory system: Secondary | ICD-10-CM | POA: Diagnosis not present

## 2022-01-02 DIAGNOSIS — E785 Hyperlipidemia, unspecified: Secondary | ICD-10-CM | POA: Diagnosis not present

## 2022-01-02 DIAGNOSIS — I4819 Other persistent atrial fibrillation: Secondary | ICD-10-CM | POA: Diagnosis not present

## 2022-01-02 DIAGNOSIS — Z951 Presence of aortocoronary bypass graft: Secondary | ICD-10-CM | POA: Diagnosis not present

## 2022-01-02 DIAGNOSIS — I080 Rheumatic disorders of both mitral and aortic valves: Secondary | ICD-10-CM | POA: Diagnosis present

## 2022-01-02 DIAGNOSIS — M1990 Unspecified osteoarthritis, unspecified site: Secondary | ICD-10-CM | POA: Diagnosis present

## 2022-01-02 DIAGNOSIS — K219 Gastro-esophageal reflux disease without esophagitis: Secondary | ICD-10-CM | POA: Diagnosis present

## 2022-01-02 DIAGNOSIS — Z9049 Acquired absence of other specified parts of digestive tract: Secondary | ICD-10-CM

## 2022-01-02 DIAGNOSIS — Z885 Allergy status to narcotic agent status: Secondary | ICD-10-CM | POA: Diagnosis not present

## 2022-01-02 LAB — BASIC METABOLIC PANEL
Anion gap: 11 (ref 5–15)
BUN: 14 mg/dL (ref 8–23)
CO2: 23 mmol/L (ref 22–32)
Calcium: 9.6 mg/dL (ref 8.9–10.3)
Chloride: 105 mmol/L (ref 98–111)
Creatinine, Ser: 1 mg/dL (ref 0.44–1.00)
GFR, Estimated: 58 mL/min — ABNORMAL LOW (ref >60)
Glucose, Bld: 95 mg/dL (ref 70–99)
Potassium: 4.5 mmol/L (ref 3.5–5.1)
Sodium: 139 mmol/L (ref 135–145)

## 2022-01-02 LAB — MAGNESIUM: Magnesium: 2.1 mg/dL (ref 1.7–2.4)

## 2022-01-02 MED ORDER — APIXABAN 5 MG PO TABS
5.0000 mg | ORAL_TABLET | Freq: Two times a day (BID) | ORAL | Status: DC
  Administered 2022-01-02 – 2022-01-05 (×6): 5 mg via ORAL
  Filled 2022-01-02 (×6): qty 1

## 2022-01-02 MED ORDER — METOPROLOL SUCCINATE ER 25 MG PO TB24: 25.0000 mg | ORAL_TABLET | Freq: Every day | ORAL | Status: DC

## 2022-01-02 MED ORDER — SODIUM CHLORIDE 0.9% FLUSH: 3.0000 mL | INTRAVENOUS | Status: DC | PRN

## 2022-01-02 MED ORDER — LOSARTAN POTASSIUM 50 MG PO TABS
50.0000 mg | ORAL_TABLET | Freq: Every day | ORAL | Status: DC
  Administered 2022-01-03 – 2022-01-05 (×3): 50 mg via ORAL
  Filled 2022-01-02 (×3): qty 1

## 2022-01-02 MED ORDER — METOPROLOL SUCCINATE ER 25 MG PO TB24
12.5000 mg | ORAL_TABLET | Freq: Once | ORAL | Status: AC
  Administered 2022-01-02: 12.5 mg via ORAL
  Filled 2022-01-02: qty 1

## 2022-01-02 MED ORDER — SODIUM CHLORIDE 0.9% FLUSH
3.0000 mL | Freq: Two times a day (BID) | INTRAVENOUS | Status: DC
  Administered 2022-01-02 – 2022-01-04 (×5): 3 mL via INTRAVENOUS

## 2022-01-02 MED ORDER — SODIUM CHLORIDE 0.9 % IV SOLN: 250.0000 mL | INTRAVENOUS | Status: DC | PRN

## 2022-01-02 MED ORDER — DOFETILIDE 500 MCG PO CAPS
500.0000 ug | ORAL_CAPSULE | Freq: Two times a day (BID) | ORAL | Status: DC
Start: 2022-01-02 — End: 2022-01-03
  Administered 2022-01-02: 500 ug via ORAL
  Filled 2022-01-02: qty 1

## 2022-01-02 MED ORDER — ACETAMINOPHEN 325 MG PO TABS: 650.0000 mg | ORAL_TABLET | ORAL | Status: DC | PRN

## 2022-01-02 NOTE — Progress Notes (Signed)
   01/02/22 1331  Assess: MEWS Score  Temp 98.8 F (37.1 C)  BP (!) 141/86  MAP (mmHg) 102  Pulse Rate (!) 112  Resp 18  Level of Consciousness Alert  SpO2 98 %  O2 Device Room Air  Assess: MEWS Score  MEWS Temp 0  MEWS Systolic 0  MEWS Pulse 2  MEWS RR 0  MEWS LOC 0  MEWS Score 2  MEWS Score Color Yellow  Assess: if the MEWS score is Yellow or Red  Were vital signs taken at a resting state? Yes  Focused Assessment No change from prior assessment  Does the patient meet 2 or more of the SIRS criteria? No  MEWS guidelines implemented *See Row Information* Yes  Treat  MEWS Interventions Other (Comment) (patient here for Tikosyn load for a flutter)  Pain Scale 0-10  Pain Score 0  Take Vital Signs  Increase Vital Sign Frequency  Yellow: Q 2hr X 2 then Q 4hr X 2, if remains yellow, continue Q 4hrs  Escalate  MEWS: Escalate Yellow: discuss with charge nurse/RN and consider discussing with provider and RRT  Notify: Charge Nurse/RN  Name of Charge Nurse/RN Notified Christy  Date Charge Nurse/RN Notified 01/02/22  Time Charge Nurse/RN Notified 1403  Notify: Provider  Provider Name/Title Oda Kilts  Date Provider Notified 01/02/22  Time Provider Notified 1403  Method of Notification Page  Notification Reason Other (Comment) (HR)  Provider response At bedside;See new orders  Date of Provider Response 01/02/22  Time of Provider Response 1618  Assess: SIRS CRITERIA  SIRS Temperature  0  SIRS Pulse 1  SIRS Respirations  0  SIRS WBC 0  SIRS Score Sum  1

## 2022-01-02 NOTE — Progress Notes (Signed)
@  2143 Pt converted to SB HR in the 40's. EKG obtained showing SB HR 52 with Qtc 459.  Concern about taking Toprol XL 25 mg that is due soon. Will monitor HR for now and addresss. Jessie Foot, RN

## 2022-01-02 NOTE — Progress Notes (Signed)
Pharmacy: Dofetilide (Tikosyn) - Initial Consult Assessment and Electrolyte Replacement  Pharmacy consulted to assist in monitoring and replacing electrolytes in this 76 y.o. female admitted on 01/02/2022 undergoing dofetilide initiation. First dofetilide dose: 01/02/22 at 8pm  Assessment:  Patient Exclusion Criteria: If any screening criteria checked as "Yes", then  patient  should NOT receive dofetilide until criteria item is corrected.  If "Yes" please indicate correction plan.  YES  NO Patient  Exclusion Criteria Correction Plan   '[]'$   '[x]'$   Baseline QTc interval is greater than or equal to 440 msec. IF above YES box checked dofetilide contraindicated unless patient has ICD; then may proceed if QTc 500-550 msec or with known ventricular conduction abnormalities may proceed with QTc 550-600 msec. QTc = 0.4    '[]'$   '[x]'$   Patient is known or suspected to have a digoxin level greater than 2 ng/ml: No results found for: "DIGOXIN"     '[]'$   '[x]'$   Creatinine clearance less than 20 ml/min (calculated using Cockcroft-Gault, actual body weight and serum creatinine): Estimated Creatinine Clearance: 50.5 mL/min (by C-G formula based on SCr of 1 mg/dL).     '[]'$   '[x]'$  Patient has received drugs known to prolong the QT intervals within the last 48 hours (phenothiazines, tricyclics or tetracyclic antidepressants, erythromycin, H-1 antihistamines, cisapride, fluoroquinolones, azithromycin, ondansetron).   Updated information on QT prolonging agents is available to be searched on the following database:QT prolonging agents     '[]'$   '[x]'$   Patient received a dose of hydrochlorothiazide (Oretic) alone or in any combination including triamterene (Dyazide, Maxzide) in the last 48 hours.    '[]'$   '[x]'$  Patient received a medication known to increase dofetilide plasma concentrations prior to initial dofetilide dose:  Trimethoprim (Primsol, Proloprim) in the last 36 hours Verapamil (Calan, Verelan) in the last 36  hours or a sustained release dose in the last 72 hours Megestrol (Megace) in the last 5 days  Cimetidine (Tagamet) in the last 6 hours Ketoconazole (Nizoral) in the last 24 hours Itraconazole (Sporanox) in the last 48 hours  Prochlorperazine (Compazine) in the last 36 hours     '[]'$   '[x]'$   Patient is known to have a history of torsades de pointes; congenital or acquired long QT syndromes.    '[]'$   '[x]'$   Patient has received a Class 1 antiarrhythmic with less than 2 half-lives since last dose. (Disopyramide, Quinidine, Procainamide, Lidocaine, Mexiletine, Flecainide, Propafenone)    '[]'$   '[x]'$   Patient has received amiodarone therapy in the past 3 months or amiodarone level is greater than 0.3 ng/ml.    Patient has been appropriately anticoagulated with Apixaban '5mg'$  BID dose ok last CBC stable 10/2021.  Labs:    Component Value Date/Time   K 4.5 01/02/2022 1303   MG 2.1 01/02/2022 1303     Plan: Potassium: K >/= 4: Appropriate to initiate Tikosyn, no replacement needed    Magnesium: Mg >2: Appropriate to initiate Tikosyn, no replacement needed      Bonnita Nasuti Pharm.D. CPP, BCPS Clinical Pharmacist [phone] 01/02/2022 2:23 PM

## 2022-01-02 NOTE — Progress Notes (Signed)
Primary Care Physician: Denise Carbon, MD Primary Electrophysiologist: Dr Denise Henson Referring Physician: Tommye Standard PA   Denise Henson is a 76 y.o. female with a history of CAD s/p CABG 2007, HTN, HLD, atrial flutter who presents for follow up in the [facility]. Patient had been maintained on amiodarone but patient discontinued this due to concerns about off target effects. Patient is on Eliquis for a CHADS2VASC score of 5. She is referred to the AF clinic to discuss dofetilide admission.   Patient presents today for dofetilide admission. She denies any missed doses of anticoagulation in the past 3 weeks. She is in SR today, feeling well.   Today, she denies symptoms of palpitations, chest pain, shortness of breath, orthopnea, PND, lower extremity edema, presyncope, syncope, snoring, daytime somnolence, bleeding, or neurologic sequela. The patient is tolerating medications without difficulties and is otherwise without complaint today.    Atrial Fibrillation Risk Factors:  she does not have symptoms or diagnosis of sleep apnea. she does not have a history of rheumatic fever.   she has a BMI of Body mass index is 32.2 kg/m.Marland Kitchen Filed Weights   01/02/22 1142  Weight: 85.1 kg    Family History  Problem Relation Age of Onset   Heart attack Mother 12   Heart attack Maternal Grandmother    Cancer Neg Hx        Breast or colon   Diabetes Neg Hx    Hypertension Neg Hx    Colon cancer Neg Hx    Colon polyps Neg Hx    Esophageal cancer Neg Hx    Rectal cancer Neg Hx    Stomach cancer Neg Hx      Atrial Fibrillation Management history:  Previous antiarrhythmic drugs: amiodarone  Previous cardioversions: 04/27/21 Previous ablations: 04/25/21 Atach CHADS2VASC score: 5 Anticoagulation history: Eliquis   Past Medical History:  Diagnosis Date   Allergic rhinitis due to pollen    Allergy    Anemia    Arthritis    Asthma    as a child    Atypical atrial flutter ([address])    a. 03/2021 EPS: L sided Aflutter; b. 04/2021 s/p DCCV; c. CHA2DS2VASc = 5-->eliquis. Has been using amio prn.   Coronary artery disease    a. 2007 [facility] [place] b. 12/2017 MV: Neg.   GERD (gastroesophageal reflux disease)    Hx of pleural effusion 2007   after CABG   Hyperlipidemia    Unable to tolerate simvastatin 40 due to myalgias   Hypertension    Mild Aortic insufficiency    a. 04/2021 Echo: EF 50-55%, no rwma, mild LVH, nl RV fxn, mild BAE, mild MR/AI.   Mild Mitral regurgitation    Past Surgical History:  Procedure Laterality Date   ATRIAL TACH ABLATION N/A 04/25/2021   Procedure: ATRIAL TACH ABLATION;  Surgeon: Denise Lance, MD;  Location: [facility];  Service: Cardiovascular;  Laterality: N/A;   CARDIOVERSION N/A 04/27/2021   Procedure: CARDIOVERSION;  Surgeon: Denise Klein, MD;  Location: [facility];  Service: Cardiovascular;  Laterality: N/A;   CESAREAN SECTION     CHOLECYSTECTOMY     COLONOSCOPY     CORONARY ARTERY BYPASS GRAFT  5/07   Readmitted for post op pleural effusions   DOPPLER ECHOCARDIOGRAPHY     NV LV EF 1+ AI/MR   EXCISION METACARPAL MASS Left 02/21/2018   Procedure: EXCISION METACARPAL MASS;  Surgeon: Denise Knows,  MD;  Location: [facility];  Service: Orthopedics;  Laterality: Left;   Stress imaging  6/07   Negative EF 67%   TEE WITHOUT CARDIOVERSION N/A 04/27/2021   Procedure: TRANSESOPHAGEAL ECHOCARDIOGRAM (TEE);  Surgeon: Denise Klein, MD;  [facility];  Service: Cardiovascular;  Laterality: N/A;   TONSILLECTOMY      Current Outpatient Medications  Medication Sig Dispense Refill   acetaminophen (TYLENOL) 325 MG tablet Take 2 tablets (650 mg total) by mouth every 4 (four) hours as needed for headache or mild pain.     apixaban (ELIQUIS) 5 MG TABS tablet Take 1 tablet (5 mg total) by mouth 2 (two) times daily. 180 tablet 1   Calcium Carb-Cholecalciferol  500-400 MG-UNIT TABS Take 1 tablet by mouth daily.     ferrous sulfate (SLOW RELEASE IRON) 160 (50 Fe) MG TBCR SR tablet Take 160 mg by mouth 3 (three) times a week.     Glucosamine 500 MG CAPS Take 500 mg by mouth daily.     losartan (COZAAR) 50 MG tablet Take 1 tablet (50 mg total) by mouth daily. 90 tablet 3   metoprolol succinate (TOPROL XL) 25 MG 24 hr tablet Take 1 tablet (25 mg total) by mouth at bedtime. (Patient taking differently: Take 25 mg by mouth at bedtime. Take 12.'5mg'$  AM and '25mg'$  PM) 90 tablet 3   Omega-3 Fatty Acids (FISH OIL) 1000 MG CAPS Take 1,000 mg by mouth daily.     Turmeric (QC TUMERIC COMPLEX PO) Take 1 capsule by mouth See admin instructions. Take 1 tablet daily when staying home and 1 tablet twice daily when traveling     No current facility-administered medications for this encounter.    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[place])    Employer: RETIRED    Comment: Retired  Tobacco Use   Smoking status: Former    Types: Cigarettes    Quit date: 06/26/1994    Years since quitting: 27.5   Smokeless tobacco: Never   Tobacco comments:    Former smoker 12/28/21  Vaping Use   Vaping Use: Never used  Substance and Sexual Activity   Alcohol use: Yes    Alcohol/week: 7.0 - 14.0 Henson drinks of alcohol    Types: 7 - 14 Henson drinks or equivalent per week    Comment: 1-2 drinks daily 12/28/21   Drug use: No   Sexual activity: Never  Other Topics Concern   Not on file  Social History Narrative   Has living will   Son Denise Henson, is health care POA   Would accept resuscitation attempts   No tube feeds if cognitively unaware    Social Determinants of Health   Financial Resource Strain: Not on file  Food Insecurity: Not on file  Transportation Needs: Not on file  Physical Activity: Not on file  Stress: Not on file  Social Connections: Not on file  Intimate Partner Violence: Not on file     ROS- All systems are reviewed and negative except as per the HPI above.  Physical Exam: Vitals:   01/02/22  1142  BP: 134/72  Pulse: 63  Weight: 85.1 kg  Height: '5\' 4"'$  (1.626 m)     GEN- The patient is a well appearing elderly obese female, alert and oriented x 3 today.   HEENT-head normocephalic, atraumatic, sclera clear, conjunctiva pink, hearing intact, trachea midline. Lungs- Clear to ausculation bilaterally, normal work of breathing Heart- Regular rate and rhythm, no murmurs, rubs or gallops  GI- soft, NT, ND, + BS Extremities- no clubbing, cyanosis, or edema MS- no significant deformity or atrophy Skin- no rash or lesion Psych- euthymic mood, full affect Neuro- strength and sensation are intact   Wt Readings from Last 3 Encounters:  01/02/22 85.1 kg  12/28/21 84.6 kg  11/10/21 87.1 kg    EKG today demonstrates  SR Vent. rate 63 BPM PR interval 180 ms QRS duration 84 ms QT/QTcB 406/415 ms  Echo 04/27/21 demonstrated   1. Left ventricular ejection fraction, by estimation, is 50 to 55%. The  left ventricle has low normal function. The left ventricle has no regional  wall motion abnormalities. There is mild left ventricular hypertrophy.  Indeterminate diastolic filling due to E-A fusion.   2. Right ventricular systolic function is mildly reduced. The right  ventricular size is normal. There is normal pulmonary artery systolic  pressure.   3. Left atrial size was mildly dilated.   4. Right atrial size was mildly dilated.   5. The mitral valve is normal in structure. Mild mitral valve  regurgitation.   6. The aortic valve is normal in structure. Aortic valve regurgitation is  mild. Mild  aortic valve sclerosis is present, with no evidence of aortic  valve stenosis.   7. The inferior vena cava is normal in size with greater than 50%  respiratory variability, suggesting right atrial pressure of 3 mmHg.   Epic records are reviewed at length today  CHA2DS2-VASc Score = 5  The patient's score is based upon: CHF History: 0 HTN History: 1 Diabetes History: 0 Stroke History: 0 Vascular Disease History: 1 Age Score: 2 Gender Score: 1       ASSESSMENT AND PLAN: 1. Atypical atrial flutter The patient's CHA2DS2-VASc score is 5, indicating a 7.2% annual risk of stroke.   Patient presents for dofetilide admission. Continue Eliquis 5 mg BID, states no missed doses in the last 3 weeks. No recent benadryl use PharmD has screened medications QTc in SR 415 ms Labs today show creatinine at 1.0, K+ 4.5 and mag 2.1, CrCl calculated at 64 mL/min Continue Toprol 12.5 mg AM and 25 mg PM  2. Secondary Hypercoagulable State (ICD10:  D68.69) The patient is at significant risk for stroke/thromboembolism based upon her CHA2DS2-VASc Score of 5.  Continue Apixaban (Eliquis).   3. Obesity Body mass index is 32.2 kg/m. Lifestyle modification was discussed and encouraged including regular physical activity and weight reduction.  4. CAD S/p CABG No anginal symptoms.  5. HTN Stable, no changes today.   To be admitted later today once a bed becomes available.    [facility] [address] [place], [place] [address] [phone] 01/02/2022 11:51 AM

## 2022-01-02 NOTE — H&P (Addendum)
Electrophysiology H&P  Note    Primary Care Physician: Denise Carbon, MD Primary Electrophysiologist: Dr Denise Henson Referring Physician: Tommye Standard PA     Denise Henson is a 76 y.o. female with a history of CAD s/p CABG 2007, HTN, HLD, atrial flutter who presents for follow up in the [facility]. Patient had been maintained on amiodarone but patient discontinued this due to concerns about off target effects. Patient is on Eliquis for a CHADS2VASC score of 5. She is referred to the AF clinic to discuss dofetilide admission.    Patient presents today for dofetilide admission. She denies any missed doses of anticoagulation in the past 3 weeks. She is in SR today, feeling well.    Today, she denies symptoms of palpitations, chest pain, shortness of breath, orthopnea, PND, lower extremity edema, presyncope, syncope, snoring, daytime somnolence, bleeding, or neurologic sequela. The patient is tolerating medications without difficulties and is otherwise without complaint today.      Atrial Fibrillation Risk Factors:   she does not have symptoms or diagnosis of sleep apnea. she does not have a history of rheumatic fever.     she has a BMI of Body mass index is 32.2 kg/m.Marland Kitchen    Filed Weights    01/02/22 1142  Weight: 85.1 kg           Family History  Problem Relation Age of Onset   Heart attack Mother 63   Heart attack Maternal Grandmother     Cancer Neg Hx          Breast or colon   Diabetes Neg Hx     Hypertension Neg Hx     Colon cancer Neg Hx     Colon polyps Neg Hx     Esophageal cancer Neg Hx     Rectal cancer Neg Hx     Stomach cancer Neg Hx          Atrial Fibrillation Management history:   Previous antiarrhythmic drugs: amiodarone  Previous cardioversions: 04/27/21 Previous ablations: 04/25/21 [place] CHADS2VASC score: 5 Anticoagulation history: Eliquis         Past Medical History:  Diagnosis Date   Allergic rhinitis due to  pollen     Allergy     Anemia     Arthritis     Asthma      as a child   Atypical atrial flutter ([place])      a. 03/2021 EPS: L sided Aflutter; b. 04/2021 s/p DCCV; c. CHA2DS2VASc = 5-->eliquis. Has been using amio prn.   Coronary artery disease      a. 2007 [facility] [place] b. 12/2017 MV: Neg.   GERD (gastroesophageal reflux disease)     Hx of pleural effusion 2007    after CABG   Hyperlipidemia      Unable to tolerate simvastatin 40 due to myalgias   Hypertension     Mild Aortic insufficiency      a. 04/2021 Echo: EF 50-55%, no rwma, mild LVH, nl RV fxn, mild BAE, mild MR/AI.   Mild Mitral regurgitation           Past Surgical History:  Procedure Laterality Date   ATRIAL TACH ABLATION N/A 04/25/2021    Procedure: ATRIAL TACH ABLATION;  Surgeon: Denise Lance, MD;  Location: [facility];  Service: Cardiovascular;  Laterality: N/A;   CARDIOVERSION N/A 04/27/2021    Procedure: CARDIOVERSION;  Surgeon: Denise Klein, MD;  Location: [facility];  Service: Cardiovascular;  Laterality: N/A;   CESAREAN SECTION       CHOLECYSTECTOMY       COLONOSCOPY       CORONARY ARTERY BYPASS GRAFT   5/07    Readmitted for post op pleural effusions   DOPPLER ECHOCARDIOGRAPHY        NV LV EF 1+ AI/MR   EXCISION METACARPAL MASS Left 02/21/2018    Procedure: EXCISION METACARPAL MASS;  Surgeon: Denise Knows, MD;  Location: [facility];  Service: Orthopedics;  Laterality: Left;   Stress imaging   6/07    Negative EF 67%   TEE WITHOUT CARDIOVERSION N/A 04/27/2021    Procedure: TRANSESOPHAGEAL ECHOCARDIOGRAM (TEE);  Surgeon: Denise Klein, MD;  [facility];  Service: Cardiovascular;  Laterality: N/A;   TONSILLECTOMY                Current Outpatient Medications  Medication Sig Dispense Refill   acetaminophen (TYLENOL) 325 MG tablet Take 2 tablets (650 mg total) by mouth every 4 (four) hours as needed for headache or mild pain.       apixaban  (ELIQUIS) 5 MG TABS tablet Take 1 tablet (5 mg total) by mouth 2 (two) times daily. 180 tablet 1   Calcium Carb-Cholecalciferol 500-400 MG-UNIT TABS Take 1 tablet by mouth daily.       ferrous sulfate (SLOW RELEASE IRON) 160 (50 Fe) MG TBCR SR tablet Take 160 mg by mouth 3 (three) times a week.       Glucosamine 500 MG CAPS Take 500 mg by mouth daily.       losartan (COZAAR) 50 MG tablet Take 1 tablet (50 mg total) by mouth daily. 90 tablet 3   metoprolol succinate (TOPROL XL) 25 MG 24 hr tablet Take 1 tablet (25 mg total) by mouth at bedtime. (Patient taking differently: Take 25 mg by mouth at bedtime. Take 12.'5mg'$  AM and '25mg'$  PM) 90 tablet 3   Omega-3 Fatty Acids (FISH OIL) 1000 MG CAPS Take 1,000 mg by mouth daily.       Turmeric (QC TUMERIC COMPLEX PO) Take 1 capsule by mouth See admin instructions. Take 1 tablet daily when staying home and 1 tablet twice daily when traveling        No current facility-administered medications for this encounter.           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[place])      Employer: RETIRED      Comment: Retired  Tobacco Use   Smoking status: Former      Types: Cigarettes      Quit date: 06/26/1994      Years since quitting: 27.5   Smokeless tobacco: Never   Tobacco comments:      Former smoker 12/28/21  Vaping Use   Vaping Use: Never used  Substance and Sexual Activity   Alcohol use: Yes      Alcohol/week: 7.0 - 14.0 Henson drinks of alcohol  Types: 7 - 14 Henson drinks or equivalent per week      Comment: 1-2 drinks daily 12/28/21   Drug use: No   Sexual activity: Never  Other Topics Concern    Not on file  Social History Narrative    Has living will    Son Denise Henson, is health care POA    Would accept resuscitation attempts    No tube feeds if cognitively unaware    Social Determinants of Health    Financial Resource Strain: Not on file  Food Insecurity: Not on file  Transportation Needs: Not on file  Physical Activity: Not on file  Stress: Not on file  Social Connections: Not on file  Intimate Partner Violence: Not on file        ROS- All systems are reviewed and negative except as per the HPI above.   Physical Exam:    Vitals:    01/02/22 1142  BP: 134/72  Pulse: 63  Weight: 85.1 kg  Height: '5\' 4"'$  (1.626 m)        GEN- The patient is a well appearing elderly obese female, alert and oriented x 3 today.   HEENT-head normocephalic, atraumatic, sclera clear, conjunctiva pink, hearing intact, trachea midline. Lungs- Clear to ausculation bilaterally, normal work of breathing Heart- Regular rate and rhythm, no murmurs, rubs or gallops  GI- soft, NT, ND, + BS Extremities- no clubbing, cyanosis, or edema MS- no significant deformity or atrophy Skin- no rash or lesion Psych- euthymic mood, full affect Neuro- strength and sensation are intact        Wt Readings from Last 3 Encounters:  01/02/22 85.1 kg  12/28/21 84.6 kg  11/10/21 87.1 kg      EKG today demonstrates  SR Vent. rate 63 BPM PR interval 180 ms QRS duration 84 ms QT/QTcB 406/415 ms   Echo 04/27/21 demonstrated   1. Left ventricular ejection fraction, by estimation, is 50 to 55%. The  left ventricle has low normal function. The left ventricle has no regional  wall motion abnormalities. There is mild left ventricular hypertrophy.  Indeterminate diastolic filling due to E-A fusion.   2. Right ventricular systolic function is mildly reduced. The right  ventricular size is normal. There is normal pulmonary artery systolic  pressure.   3. Left atrial size was mildly dilated.   4. Right  atrial size was mildly dilated.   5. The mitral valve is normal in structure. Mild mitral valve  regurgitation.   6. The aortic valve is normal in structure. Aortic valve regurgitation is  mild. Mild aortic valve sclerosis is present, with no evidence of aortic  valve stenosis.   7. The inferior vena cava is normal in size with greater than 50%  respiratory variability, suggesting right atrial pressure of 3 mmHg.    Epic records are reviewed at length today   CHA2DS2-VASc Score = 5  The patient's score is based upon: CHF History: 0 HTN History: 1 Diabetes History: 0 Stroke History: 0 Vascular Disease History: 1 Age Score: 2 Gender Score: 1         ASSESSMENT AND PLAN: 1. Atypical atrial flutter The patient's CHA2DS2-VASc score is 5, indicating a 7.2% annual risk of stroke.   Patient presents for dofetilide admission. Continue Eliquis 5 mg BID, states no missed doses in the last 3 weeks. No recent benadryl use PharmD has screened medications QTc in SR 415 ms Labs today show creatinine at 1.0, K+ 4.5 and mag 2.1, CrCl  calculated at 64 mL/min Continue Toprol 12.5 mg AM and 25 mg PM   2. Secondary Hypercoagulable State (ICD10:  D68.69) The patient is at significant risk for stroke/thromboembolism based upon her CHA2DS2-VASc Score of 5.  Continue Apixaban (Eliquis).    3. Obesity Body mass index is 32.2 kg/m. Lifestyle modification was discussed and encouraged including regular physical activity and weight reduction.   4. CAD S/p CABG No anginal symptoms.   5. HTN Stable, no changes today.   She presents today for tikosyn admission.   Legrand Como [address] [place], PA-C  01/02/2022 1:44 PM   EP Attending  Patient seen and examined. Agree with above. The patient presents today for initiation of dofetilide. She was in rhythm and has reverted back to atypical atrial flutter with a RVR. Her QT is acceptable. She has not missed any anti-coagulation. She will undergo DCCV if she  does not revert to NSR in 2 days.   Carleene Overlie Donnalynn Wheeless,MD

## 2022-01-02 NOTE — Care Management (Signed)
  Transition of Care [facility]) Screening Note   Patient Details  Name: Denise Henson Date of Birth: 03-08-1946   Transition of [facility]) CM/SW Contact:    Bethena Roys, RN Phone Number: 01/02/2022, 1:42 PM    Transition of Care Department [facility]) has reviewed the patient. Case Manager received a consult for Tikosyn. Benefits check submitted. Case Manager will follow for cost and pharmacy of choice.

## 2022-01-02 NOTE — Progress Notes (Signed)
Patient arrived to 6E07 in NAD, VS stable, and patient free from pain. Patient oriented to room and call bell in reach

## 2022-01-03 ENCOUNTER — Telehealth (HOSPITAL_COMMUNITY): Payer: Self-pay | Admitting: Pharmacy Technician

## 2022-01-03 ENCOUNTER — Other Ambulatory Visit (HOSPITAL_COMMUNITY): Payer: Self-pay

## 2022-01-03 DIAGNOSIS — I484 Atypical atrial flutter: Secondary | ICD-10-CM | POA: Diagnosis not present

## 2022-01-03 LAB — BASIC METABOLIC PANEL
Anion gap: 8 (ref 5–15)
BUN: 12 mg/dL (ref 8–23)
CO2: 26 mmol/L (ref 22–32)
Calcium: 8.8 mg/dL — ABNORMAL LOW (ref 8.9–10.3)
Chloride: 106 mmol/L (ref 98–111)
Creatinine, Ser: 1.02 mg/dL — ABNORMAL HIGH (ref 0.44–1.00)
GFR, Estimated: 57 mL/min — ABNORMAL LOW (ref >60)
Glucose, Bld: 101 mg/dL — ABNORMAL HIGH (ref 70–99)
Potassium: 4.1 mmol/L (ref 3.5–5.1)
Sodium: 140 mmol/L (ref 135–145)

## 2022-01-03 LAB — MAGNESIUM: Magnesium: 1.8 mg/dL (ref 1.7–2.4)

## 2022-01-03 MED ORDER — DOFETILIDE 250 MCG PO CAPS
250.0000 ug | ORAL_CAPSULE | Freq: Two times a day (BID) | ORAL | Status: DC
  Administered 2022-01-03 – 2022-01-05 (×5): 250 ug via ORAL
  Filled 2022-01-03 (×5): qty 1

## 2022-01-03 MED ORDER — MAGNESIUM SULFATE 4 GM/100ML IV SOLN
4.0000 g | Freq: Once | INTRAVENOUS | Status: AC
  Administered 2022-01-03: 4 g via INTRAVENOUS
  Filled 2022-01-03: qty 100

## 2022-01-03 MED ORDER — METOPROLOL SUCCINATE ER 25 MG PO TB24
12.5000 mg | ORAL_TABLET | Freq: Every day | ORAL | Status: DC
  Administered 2022-01-03: 12.5 mg via ORAL
  Filled 2022-01-03: qty 1

## 2022-01-03 NOTE — Progress Notes (Signed)
Pts has maintained SB on tele with rates 40's-mid 50's, with non sustained drops as low as 36. Pt did not receive Toprol XL 25 mg. Will continue to monitor and have MD address hold parameters for future doses on am rounds. Jessie Foot, RN

## 2022-01-03 NOTE — Telephone Encounter (Signed)
Pharmacy Patient Advocate Encounter  Insurance verification completed.    The patient is insured through [employer]   The patient is currently admitted and ran test claims for the following: Dofetilide (Tikosyn) 500 mcg.  Copays and coinsurance results were relayed to Inpatient clinical team.

## 2022-01-03 NOTE — Plan of Care (Signed)
  Problem: Education: Goal: Knowledge of disease or condition will improve Outcome: Progressing Goal: Understanding of medication regimen will improve Outcome: Progressing   Problem: Health Behavior/Discharge Planning: Goal: Ability to safely manage health-related needs after discharge will improve Outcome: Progressing   Problem: Activity: Goal: Ability to tolerate increased activity will improve Outcome: Completed/Met

## 2022-01-03 NOTE — Plan of Care (Signed)
  Problem: Education: Goal: Knowledge of General Education information will improve Description: Including pain rating scale, medication(s)/side effects and non-pharmacologic comfort measures Outcome: Progressing   Problem: Health Behavior/Discharge Planning: Goal: Ability to manage health-related needs will improve Outcome: Progressing   Problem: Clinical Measurements: Goal: Cardiovascular complication will be avoided Outcome: Progressing   Problem: Safety: Goal: Ability to remain free from injury will improve Outcome: Progressing   

## 2022-01-03 NOTE — Progress Notes (Addendum)
Electrophysiology Rounding Note  Patient Name: Denise Henson Date of Encounter: 01/03/2022  Primary Cardiologist: Cristopher Peru, MD  Electrophysiologist: Cristopher Peru, MD    Subjective   Pt converted to sinus rhythm on Tikosyn 500 mcg BID   QTc from EKG last pm shows borderline QT that corrects due to bradycardia. It is prolonged compared to baseline.   The patient is doing well today.  At this time, the patient denies chest pain, shortness of breath, or any new concerns.  Inpatient Medications    Scheduled Meds:  apixaban  5 mg Oral BID   dofetilide  500 mcg Oral BID   losartan  50 mg Oral Daily   metoprolol succinate  25 mg Oral QHS   sodium chloride flush  3 mL Intravenous Q12H   Continuous Infusions:  sodium chloride     PRN Meds: sodium chloride, acetaminophen, sodium chloride flush   Vital Signs    Vitals:   01/02/22 1730 01/02/22 2148 01/02/22 2204 01/03/22 0456  BP: (!) 162/111 (!) 143/109 (!) 148/77 125/68  Pulse: (!) 116 (!) 55  (!) 57  Resp: '18 18  20  '$ Temp: 97.8 F (36.6 C) 97.6 F (36.4 C)  97.8 F (36.6 C)  TempSrc: Oral Oral  Oral  SpO2: 98% 98%  98%  Weight:      Height:        Intake/Output Summary (Last 24 hours) at 01/03/2022 0710 Last data filed at 01/02/2022 1800 Gross per 24 hour  Intake 600 ml  Output --  Net 600 ml   Filed Weights   01/02/22 1346  Weight: 84.9 kg    Physical Exam    GEN- The patient is well appearing, alert and oriented x 3 today.   Head- normocephalic, atraumatic Eyes-  Sclera clear, conjunctiva pink Ears- hearing intact Oropharynx- clear Neck- supple Lungs- Clear to ausculation bilaterally, normal work of breathing Heart- Regular rate and rhythm, no murmurs, rubs or gallops GI- soft, NT, ND, + BS Extremities- no clubbing, cyanosis, or edema Skin- no rash or lesion Psych- euthymic mood, full affect Neuro- strength and sensation are intact  Labs    CBC No results for input(s): "WBC",  "NEUTROABS", "HGB", "HCT", "MCV", "PLT" in the last 72 hours. Basic Metabolic Panel Recent Labs    01/02/22 1303 01/03/22 0422  NA 139 140  K 4.5 4.1  CL 105 106  CO2 23 26  GLUCOSE 95 101*  BUN 14 12  CREATININE 1.00 1.02*  CALCIUM 9.6 8.8*  MG 2.1 1.8    Potassium  Date/Time Value Ref Range Status  01/03/2022 04:22 AM 4.1 3.5 - 5.1 mmol/L Final   Magnesium  Date/Time Value Ref Range Status  01/03/2022 04:22 AM 1.8 1.7 - 2.4 mg/dL Final    Comment:    Performed at [facility], [place] [address], [place], [place] [address]    Telemetry    NSR on arrival then to atrial flutter, now back in NSR 50s (personally reviewed)  Radiology    No results found.   Patient Profile     Denise Henson is a 76 y.o. female with a past medical history significant for persistent atrial fibrillation.  They were admitted for tikosyn load.   Assessment & Plan    Persistent atrial fibrillation Pt converted to sinus rhythm on Tikosyn 500 mcg BID  QT borderline this am, will discuss with MD Continue Eliquis K 4.1, Mg 1.8. Supplement Mg  Patient will not require cardioversion.  ADDENDUM Will decrease dose to 250 mcg BID per Dr. Lovena Le.   For questions or updates, please contact [place] Please consult [email or web address] for contact info under Cardiology/STEMI.  Signed, Shirley Friar, PA-C  01/03/2022, 7:10 AM   EP Attending  Patient seen and examined. Agree with above. Her QT is increased and we will plan to reduce her dose of dofetilide to 250 mcg bid. Follow QT. She has reverted back to NSR.   Carleene Overlie Sreshta Cressler,MD

## 2022-01-03 NOTE — Progress Notes (Signed)
Morning EKG reviewed    Shows remains in NSR in the 40s bpm with stable QTc at ~480 ms. Improved on lower dose  Continue  Tikosyn 250 mcg BID for now.    Pt will not require DCCV   Annamaria Helling  Pager: [phone]  01/03/2022 11:59 AM

## 2022-01-03 NOTE — Progress Notes (Signed)
Pharmacy: Dofetilide (Tikosyn) - Follow Up Assessment and Electrolyte Replacement  Pharmacy consulted to assist in monitoring and replacing electrolytes in this 76 y.o. female admitted on 01/02/2022 undergoing dofetilide initiation.   Labs:    Component Value Date/Time   K 4.1 01/03/2022 0422   MG 1.8 01/03/2022 0422     Plan: Potassium: K >/= 4: No additional supplementation needed  Magnesium: Mg 4gm IV per MD    Thank you for allowing pharmacy to participate in this patient's care   Hildred Laser, PharmD Clinical Pharmacist **Pharmacist phone directory can now be found on [email or web address] ([email or web address]).  Listed under [place].

## 2022-01-03 NOTE — TOC Benefit Eligibility Note (Signed)
Patient Teacher, English as a foreign language completed.    The patient is currently admitted and upon discharge could be taking dofetilide (Tikosyn) 500 mcg.  The current 30 day co-pay is, $63.30.   The patient is insured through [place], [place] Patient Advocate Specialist [place] Patient Advocate Team Direct Number: [phone]  Fax: [phone]

## 2022-01-04 DIAGNOSIS — I484 Atypical atrial flutter: Secondary | ICD-10-CM | POA: Diagnosis not present

## 2022-01-04 LAB — BASIC METABOLIC PANEL
Anion gap: 6 (ref 5–15)
BUN: 16 mg/dL (ref 8–23)
CO2: 24 mmol/L (ref 22–32)
Calcium: 8.4 mg/dL — ABNORMAL LOW (ref 8.9–10.3)
Chloride: 107 mmol/L (ref 98–111)
Creatinine, Ser: 1.16 mg/dL — ABNORMAL HIGH (ref 0.44–1.00)
GFR, Estimated: 49 mL/min — ABNORMAL LOW (ref >60)
Glucose, Bld: 96 mg/dL (ref 70–99)
Potassium: 4.1 mmol/L (ref 3.5–5.1)
Sodium: 137 mmol/L (ref 135–145)

## 2022-01-04 LAB — MAGNESIUM: Magnesium: 2.4 mg/dL (ref 1.7–2.4)

## 2022-01-04 NOTE — Progress Notes (Signed)
Morning EKG reviewed    Shows remains in NSR with stable QTc at ~450 ms.  Continue  Tikosyn 250 mcg BID.   Plan for home tomorrow if QTc remains stable.   Shirley Friar, PA-C  Pager: [phone]  01/04/2022 11:10 AM

## 2022-01-04 NOTE — Progress Notes (Addendum)
Electrophysiology Rounding Note  Patient Name: Denise Henson Date of Encounter: 01/04/2022  Primary Cardiologist: Cristopher Peru, MD  Electrophysiologist: Cristopher Peru, MD    Subjective   Pt remains in NSR on Tikosyn 250 mcg BID   QTc from EKG last pm shows stable QTc at ~440-450  The patient is doing well today.  At this time, the patient denies chest pain, shortness of breath, or any new concerns.  Inpatient Medications    Scheduled Meds:  apixaban  5 mg Oral BID   dofetilide  250 mcg Oral BID   losartan  50 mg Oral Daily   metoprolol succinate  12.5 mg Oral QHS   sodium chloride flush  3 mL Intravenous Q12H   Continuous Infusions:  sodium chloride     PRN Meds: sodium chloride, acetaminophen, sodium chloride flush   Vital Signs    Vitals:   01/03/22 2215 01/03/22 2259 01/03/22 2326 01/04/22 0357  BP: (!) 159/75 121/81 (!) 128/53 (!) 124/54  Pulse:   (!) 59 (!) 56  Resp:   16 19  Temp:   97.6 F (36.4 C) 98.3 F (36.8 C)  TempSrc:   Oral Oral  SpO2:   100% 97%  Weight:      Height:        Intake/Output Summary (Last 24 hours) at 01/04/2022 0724 Last data filed at 01/03/2022 2200 Gross per 24 hour  Intake 270 ml  Output --  Net 270 ml   Filed Weights   01/02/22 1346  Weight: 84.9 kg    Physical Exam    GEN- The patient is well appearing, alert and oriented x 3 today.   Head- normocephalic, atraumatic Eyes-  Sclera clear, conjunctiva pink Ears- hearing intact Oropharynx- clear Neck- supple Lungs- Clear to ausculation bilaterally, normal work of breathing Heart- Regular rate and rhythm, no murmurs, rubs or gallops GI- soft, NT, ND, + BS Extremities- no clubbing, cyanosis, or edema Skin- no rash or lesion Psych- euthymic mood, full affect Neuro- strength and sensation are intact  Labs    CBC No results for input(s): "WBC", "NEUTROABS", "HGB", "HCT", "MCV", "PLT" in the last 72 hours. Basic Metabolic Panel Recent Labs    01/03/22 0422  01/04/22 0351  NA 140 137  K 4.1 4.1  CL 106 107  CO2 26 24  GLUCOSE 101* 96  BUN 12 16  CREATININE 1.02* 1.16*  CALCIUM 8.8* 8.4*  MG 1.8 2.4    Potassium  Date/Time Value Ref Range Status  01/04/2022 03:51 AM 4.1 3.5 - 5.1 mmol/L Final   Magnesium  Date/Time Value Ref Range Status  01/04/2022 03:51 AM 2.4 1.7 - 2.4 mg/dL Final    Comment:    Performed at [facility], [place] [address], [place], [place] [address]    Telemetry    NSR 40-60s (personally reviewed)  Radiology    No results found.   Patient Profile     Denise Henson is a 76 y.o. female with a past medical history significant for persistent atrial fibrillation.  They were admitted for tikosyn load.   Assessment & Plan    Persistent atrial fibrillation Pt  remains in NSR  on Tikosyn 250 mcg BID  Continue Eliquis K 4.1, Mg 2.4. No supplementation needed  2. Bradycardia Improved with decrease of metoprolol.   Plan for home tomorrow if QTc remains stable.    For questions or updates, please contact [place] Please consult [email or web address] for contact info under Cardiology/STEMI.  Signed, Shirley Friar, PA-C  01/04/2022, 7:24 AM   EP Attending  Patient seen and examined. She is maintaining NSR and her sinus rates are improved with a reduction in her beta blocker. Her QT is acceptable now that we have reduced the dose of dofetilide. Ok to DC tomorrow if her QT is not prolonged.  Carleene Overlie Hallie Ishida,MD

## 2022-01-04 NOTE — Care Management (Addendum)
Case Manager spoke with the patient regarding Tikosyn cost. Patient wants to use [facility] for cost savings. Initial Rx to be filled at [facility] and patient wants Rx Refills 90 day supply to be escribed to [facility] in [place]. No further home needs identified.

## 2022-01-04 NOTE — Progress Notes (Signed)
Pharmacy: Dofetilide (Tikosyn) - Follow Up Assessment and Electrolyte Replacement  Pharmacy consulted to assist in monitoring and replacing electrolytes in this 76 y.o. female admitted on 01/02/2022 undergoing dofetilide initiation.  Labs:    Component Value Date/Time   K 4.1 01/04/2022 0351   MG 2.4 01/04/2022 0351     Plan: Potassium: K >/= 4: No additional supplementation needed  Magnesium: Mg > 2: No additional supplementation needed    Thank you for allowing pharmacy to participate in this patient's care   Hildred Laser, PharmD Clinical Pharmacist **Pharmacist phone directory can now be found on [email or web address] ([email or web address]).  Listed under [place].

## 2022-01-04 NOTE — Plan of Care (Signed)
  Problem: Education: Goal: Knowledge of General Education information will improve Description: Including pain rating scale, medication(s)/side effects and non-pharmacologic comfort measures Outcome: Progressing   Problem: Clinical Measurements: Goal: Cardiovascular complication will be avoided Outcome: Progressing   Problem: Safety: Goal: Ability to remain free from injury will improve Outcome: Progressing   Problem: Cardiac: Goal: Ability to achieve and maintain adequate cardiopulmonary perfusion will improve Outcome: Progressing   Problem: Health Behavior/Discharge Planning: Goal: Ability to safely manage health-related needs after discharge will improve Outcome: Progressing

## 2022-01-05 ENCOUNTER — Other Ambulatory Visit (HOSPITAL_COMMUNITY): Payer: Self-pay

## 2022-01-05 DIAGNOSIS — I484 Atypical atrial flutter: Secondary | ICD-10-CM | POA: Diagnosis not present

## 2022-01-05 LAB — BASIC METABOLIC PANEL
Anion gap: 5 (ref 5–15)
BUN: 17 mg/dL (ref 8–23)
CO2: 25 mmol/L (ref 22–32)
Calcium: 8.9 mg/dL (ref 8.9–10.3)
Chloride: 110 mmol/L (ref 98–111)
Creatinine, Ser: 0.95 mg/dL (ref 0.44–1.00)
GFR, Estimated: >60 mL/min (ref >60)
Glucose, Bld: 95 mg/dL (ref 70–99)
Potassium: 4.4 mmol/L (ref 3.5–5.1)
Sodium: 140 mmol/L (ref 135–145)

## 2022-01-05 LAB — MAGNESIUM: Magnesium: 2.2 mg/dL (ref 1.7–2.4)

## 2022-01-05 MED ORDER — DOFETILIDE 250 MCG PO CAPS
250.0000 ug | ORAL_CAPSULE | Freq: Two times a day (BID) | ORAL | 6 refills | Status: DC
  Filled 2022-01-05: qty 60, 30d supply, fill #0

## 2022-01-05 NOTE — Care Management Important Message (Deleted)
Important Message  Patient Details  Name: KELVIN BURPEE MRN: [id number] Date of Birth: Nov 21, 1945   Medicare Important Message Given:  Yes     Shelda Altes 01/05/2022, 9:41 AM

## 2022-01-05 NOTE — Progress Notes (Signed)
HR = 50- 55 bpm, Stable BP. Held Metoprolol 12.5 mg PO. Informed Mitzie Na, MD.

## 2022-01-05 NOTE — Progress Notes (Signed)
EKG from yesterday evening 01/04/2022 reviewed    Shows remains in NSR at 53 bpm with stable QTc at ~450 ms.  Continue  Tikosyn 250 mcg BID.   K 4.4, Mg 2.2, no supp needed.  Home today if QT remains stable after am dose.     Shirley Friar, PA-C  Pager: [phone]  01/05/2022 7:05 AM

## 2022-01-05 NOTE — Discharge Summary (Addendum)
ELECTROPHYSIOLOGY PROCEDURE DISCHARGE SUMMARY    Patient ID: TAITUM MENTON,  MRN: [id number], DOB/AGE: April 12, 1946 76 y.o.  Admit date: 01/02/2022 Discharge date: 01/05/2022  Primary Care Physician: Venia Carbon, MD  Primary Cardiologist: Cristopher Peru, MD  Electrophysiologist: Cristopher Peru, MD   Primary Discharge Diagnosis:  1.  Persistent atrial fibrillation status post Tikosyn loading this admission  Allergies  Allergen Reactions   Codeine Anxiety    Jittery    Morphine Anxiety    jittery   Other Anxiety    Opiates - Jittery, Anxiety   Zestril [Lisinopril] Cough     Procedures This Admission:  1.  Tikosyn loading   Brief HPI: Denise Henson is a 76 y.o. female with a past medical history as noted above.  They were referred to EP in the outpatient setting for treatment options of atrial fibrillation.  Risks, benefits, and alternatives to Tikosyn were reviewed with the patient who wished to proceed.    Hospital Course:  The patient was admitted and Tikosyn was initiated.  Renal function and electrolytes were followed during the hospitalization.  Their QTc remained stable. The patient converted chemically and did not require cardioversion They were monitored on telemetry up to discharge. On the day of discharge, they were examined by Dr. Lovena Le  who considered them stable for discharge to home.  Follow-up has been arranged with the Atrial Fibrillation clinic in approximately 1 week and with Dr. Lovena Le  in 4 weeks.   Physical Exam: Vitals:   01/04/22 2002 01/04/22 2258 01/05/22 0549 01/05/22 0800  BP: 136/71 (!) 153/66 (!) 158/71 126/70  Pulse: 61 (!) 49 70   Resp: '18 18 19 18  '$ Temp: 98 F (36.7 C) 97.7 F (36.5 C) 97.9 F (36.6 C) 98.5 F (36.9 C)  TempSrc: Oral Oral Oral Oral  SpO2: 97% 100% 90% 96%  Weight:      Height:        GEN- The patient is well appearing, alert and oriented x 3 today.   HEENT: normocephalic, atraumatic; sclera clear,  conjunctiva pink; hearing intact; oropharynx clear; neck supple, no JVP Lymph- no cervical lymphadenopathy Lungs- Clear to ausculation bilaterally, normal work of breathing.  No wheezes, rales, rhonchi Heart- Regular rate and rhythm, no murmurs, rubs or gallops, PMI not laterally displaced GI- soft, non-tender, non-distended, bowel sounds present, no hepatosplenomegaly Extremities- no clubbing, cyanosis, or edema; DP/PT/radial pulses 2+ bilaterally MS- no significant deformity or atrophy Skin- warm and dry, no rash or lesion Psych- euthymic mood, full affect Neuro- strength and sensation are intact  Labs:   Lab Results  Component Value Date   WBC 5.8 10/27/2021   HGB 13.7 10/27/2021   HCT 41.8 10/27/2021   MCV 97.2 10/27/2021   PLT 255 10/27/2021    Recent Labs  Lab 01/05/22 0446  NA 140  K 4.4  CL 110  CO2 25  BUN 17  CREATININE 0.95  CALCIUM 8.9  GLUCOSE 95    Discharge Medications:  Allergies as of 01/05/2022       Reactions   Codeine Anxiety   Jittery   Morphine Anxiety   jittery   Other Anxiety   Opiates - Jittery, Anxiety   Zestril [lisinopril] Cough        Medication List     STOP taking these medications    metoprolol succinate 25 MG 24 hr tablet Commonly known as: Toprol XL       TAKE these medications  acetaminophen 650 MG CR tablet Commonly known as: TYLENOL Take 1,300 mg by mouth 2 (two) times daily as needed for pain.   acetaminophen 325 MG tablet Commonly known as: TYLENOL Take 2 tablets (650 mg total) by mouth every 4 (four) hours as needed for headache or mild pain.   apixaban 5 MG Tabs tablet Commonly known as: ELIQUIS Take 1 tablet (5 mg total) by mouth 2 (two) times daily.   CALCIUM + VITAMIN D3 PO Take 1 tablet by mouth daily.   dofetilide 250 MCG capsule Commonly known as: TIKOSYN Take 1 capsule (250 mcg total) by mouth 2 (two) times daily.   FISH OIL PO Take 1 capsule by mouth daily.   Glucosamine 500 MG  Caps Take 500 mg by mouth daily.   losartan 50 MG tablet Commonly known as: COZAAR Take 1 tablet (50 mg total) by mouth daily.   QC TUMERIC COMPLEX PO Take 1 capsule by mouth See admin instructions. Take 1 tablet daily when staying home and 1 tablet twice daily when traveling   Slow Release Iron 160 (50 Fe) MG Tbcr SR tablet Generic drug: ferrous sulfate Take 160 mg by mouth every Monday, Wednesday, and Friday.        Disposition:    Follow-up Information     [facility] Follow up.   Specialty: Cardiology Why: on 7/19 at 1000 am for post tikosn follow up Contact information: [address] [id number] [place] [address] [phone]                Duration of Discharge Encounter: Greater than 30 minutes including physician time.  Jacalyn Lefevre, PA-C  01/05/2022 10:09 AM  EP Attending  Patient seen and examined. Agree with above. The patient is doiong well with a stable QT and no atrial fib/flutter on dofetilide. She can be discharged home with usual followup as noted above.   Carleene Overlie Alee Gressman,MD

## 2022-01-05 NOTE — Progress Notes (Signed)
Pharmacy: Dofetilide (Tikosyn) - Follow Up Assessment and Electrolyte Replacement  Pharmacy consulted to assist in monitoring and replacing electrolytes in this 76 y.o. female admitted on 01/02/2022 undergoing dofetilide initiation.   Labs:    Component Value Date/Time   K 4.4 01/05/2022 0446   MG 2.2 01/05/2022 0446     Plan: Potassium: K >/= 4: No additional supplementation needed  Magnesium: Mg > 2: No additional supplementation needed   Thank you for allowing pharmacy to participate in this patient's care   Hildred Laser, PharmD Clinical Pharmacist **Pharmacist phone directory can now be found on [email or web address] ([email or web address]).  Listed under [place].

## 2022-01-06 NOTE — Progress Notes (Signed)
Noted  

## 2022-01-11 ENCOUNTER — Ambulatory Visit (HOSPITAL_COMMUNITY)
Admit: 2022-01-11 | Discharge: 2022-01-11 | Disposition: A | Discharge status: Home / Self Care | Payer: Medicare HMO | Source: Ambulatory Visit | Attending: Physician Assistant | Admitting: Physician Assistant

## 2022-01-11 ENCOUNTER — Encounter (HOSPITAL_COMMUNITY): Payer: Self-pay | Admitting: Physician Assistant

## 2022-01-11 VITALS — BP 126/72 | HR 63 | Ht 64.0 in | Wt 183.6 lb

## 2022-01-11 DIAGNOSIS — I484 Atypical atrial flutter: Secondary | ICD-10-CM | POA: Insufficient documentation

## 2022-01-11 DIAGNOSIS — I2510 Atherosclerotic heart disease of native coronary artery without angina pectoris: Secondary | ICD-10-CM | POA: Diagnosis not present

## 2022-01-11 DIAGNOSIS — E669 Obesity, unspecified: Secondary | ICD-10-CM | POA: Diagnosis not present

## 2022-01-11 DIAGNOSIS — Z7901 Long term (current) use of anticoagulants: Secondary | ICD-10-CM | POA: Diagnosis not present

## 2022-01-11 DIAGNOSIS — D6869 Other thrombophilia: Secondary | ICD-10-CM | POA: Insufficient documentation

## 2022-01-11 DIAGNOSIS — Z6831 Body mass index (BMI) 31.0-31.9, adult: Secondary | ICD-10-CM | POA: Insufficient documentation

## 2022-01-11 DIAGNOSIS — Z79899 Other long term (current) drug therapy: Secondary | ICD-10-CM | POA: Diagnosis not present

## 2022-01-11 DIAGNOSIS — I10 Essential (primary) hypertension: Secondary | ICD-10-CM | POA: Diagnosis not present

## 2022-01-11 LAB — BASIC METABOLIC PANEL
Anion gap: 11 (ref 5–15)
BUN: 16 mg/dL (ref 8–23)
CO2: 22 mmol/L (ref 22–32)
Calcium: 9.6 mg/dL (ref 8.9–10.3)
Chloride: 107 mmol/L (ref 98–111)
Creatinine, Ser: 0.96 mg/dL (ref 0.44–1.00)
GFR, Estimated: >60 mL/min (ref >60)
Glucose, Bld: 100 mg/dL — ABNORMAL HIGH (ref 70–99)
Potassium: 4.1 mmol/L (ref 3.5–5.1)
Sodium: 140 mmol/L (ref 135–145)

## 2022-01-11 LAB — MAGNESIUM: Magnesium: 2.1 mg/dL (ref 1.7–2.4)

## 2022-01-11 MED ORDER — DOFETILIDE 250 MCG PO CAPS: 250.0000 ug | ORAL_CAPSULE | Freq: Two times a day (BID) | ORAL | 1 refills | Status: DC

## 2022-01-11 NOTE — Progress Notes (Signed)
Primary Care Physician: Venia Carbon, MD Primary Electrophysiologist: Dr Lovena Le Referring Physician: Tommye Standard PA   Denise Henson is a 76 y.o. female with a history of CAD s/p CABG 2007, HTN, HLD, atrial flutter who presents for follow up in the [facility]. Patient had been maintained on amiodarone but patient discontinued this due to concerns about off target effects. Patient is on Eliquis for a CHADS2VASC score of 5. She is referred to the AF clinic to discuss dofetilide admission.   On follow up today, patient is s/p dofetilide admission 7/10-7/13/23. She did not require DCCV. Patient feels well today and was very pleased with her care during her admission. She denies any tachypalpitations since starting dofetilide.   Today, she denies symptoms of palpitations, chest pain, shortness of breath, orthopnea, PND, lower extremity edema, presyncope, syncope, snoring, daytime somnolence, bleeding, or neurologic sequela. The patient is tolerating medications without difficulties and is otherwise without complaint today.    Atrial Fibrillation Risk Factors:  she does not have symptoms or diagnosis of sleep apnea. she does not have a history of rheumatic fever.   she has a BMI of Body mass index is 31.51 kg/m.Marland Kitchen Filed Weights   01/11/22 1004  Weight: 83.3 kg     Family History  Problem Relation Age of Onset   Heart attack Mother 3   Heart attack Maternal Grandmother    Cancer Neg Hx        Breast or colon   Diabetes Neg Hx    Hypertension Neg Hx    Colon cancer Neg Hx    Colon polyps Neg Hx    Esophageal cancer Neg Hx    Rectal cancer Neg Hx    Stomach cancer Neg Hx      Atrial Fibrillation Management history:  Previous antiarrhythmic drugs: amiodarone, dofetilide   Previous cardioversions: 04/27/21 Previous ablations: 04/25/21 Atach CHADS2VASC score: 5 Anticoagulation history: Eliquis   Past Medical History:  Diagnosis Date    Allergic rhinitis due to pollen    Allergy    Anemia    Arthritis    Asthma    as a child   Atypical atrial flutter ([place])    a. 03/2021 EPS: L sided Aflutter; b. 04/2021 s/p DCCV; c. CHA2DS2VASc = 5-->eliquis. Has been using amio prn.   Coronary artery disease    a. 2007 [facility] [place] b. 12/2017 MV: Neg.   GERD (gastroesophageal reflux disease)    Hx of pleural effusion 2007   after CABG   Hyperlipidemia    Unable to tolerate simvastatin 40 due to myalgias   Hypertension    Mild Aortic insufficiency    a. 04/2021 Echo: EF 50-55%, no rwma, mild LVH, nl RV fxn, mild BAE, mild MR/AI.   Mild Mitral regurgitation    Past Surgical History:  Procedure Laterality Date   ATRIAL TACH ABLATION N/A 04/25/2021   Procedure: ATRIAL TACH ABLATION;  Surgeon: Evans Lance, MD;  Location: [facility];  Service: Cardiovascular;  Laterality: N/A;   CARDIOVERSION N/A 04/27/2021   Procedure: CARDIOVERSION;  Surgeon: Sanda Klein, MD;  [facility];  Service: Cardiovascular;  Laterality: N/A;   CESAREAN SECTION     CHOLECYSTECTOMY     COLONOSCOPY     CORONARY ARTERY BYPASS GRAFT  5/07   Readmitted for post op pleural effusions   DOPPLER ECHOCARDIOGRAPHY     NV LV EF 1+ AI/MR   EXCISION  METACARPAL MASS Left 02/21/2018   Procedure: EXCISION METACARPAL MASS;  Surgeon: Hessie Knows, MD;  Location: [facility];  Service: Orthopedics;  Laterality: Left;   Stress imaging  6/07   Negative EF 67%   TEE WITHOUT CARDIOVERSION N/A 04/27/2021   Procedure: TRANSESOPHAGEAL ECHOCARDIOGRAM (TEE);  Surgeon: Sanda Klein, MD;  Location: [facility];  Service: Cardiovascular;  Laterality: N/A;   TONSILLECTOMY      Current Outpatient Medications  Medication Sig Dispense Refill   acetaminophen (TYLENOL) 650 MG CR tablet Take 1,300 mg by mouth 2 (two) times daily as needed for pain.     apixaban (ELIQUIS) 5 MG TABS tablet Take 1 tablet (5 mg total) by mouth 2  (two) times daily. 180 tablet 1   Calcium Carb-Cholecalciferol (CALCIUM + VITAMIN D3 PO) Take 1 tablet by mouth daily.     ferrous sulfate (SLOW RELEASE IRON) 160 (50 Fe) MG TBCR SR tablet Take 160 mg by mouth every Monday, Wednesday, and Friday.     Glucosamine 500 MG CAPS Take 500 mg by mouth daily.     losartan (COZAAR) 50 MG tablet Take 1 tablet (50 mg total) by mouth daily. 90 tablet 3   Omega-3 Fatty Acids (FISH OIL PO) Take 1 capsule by mouth daily.     Turmeric (QC TUMERIC COMPLEX PO) Take 1 capsule by mouth See admin instructions. Take 1 tablet daily when staying home and 1 tablet twice daily when traveling     dofetilide (TIKOSYN) 250 MCG capsule Take 1 capsule (250 mcg total) by mouth 2 (two) times daily. 90 capsule 1   No current facility-administered medications for this encounter.    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[place])    Employer: RETIRED    Comment: Retired  Tobacco Use   Smoking status: Former    Types: Cigarettes    Quit date: 06/26/1994    Years since quitting: 27.5   Smokeless tobacco: Never   Tobacco comments:    Former smoker 12/28/21  Vaping Use   Vaping Use: Never used  Substance and Sexual Activity   Alcohol use: Not Currently    Alcohol/week: 7.0 - 14.0 Henson drinks of alcohol    Types: 7 - 14 Henson drinks or equivalent per week    Comment: 1-2 drinks daily 12/28/21   Drug use: No   Sexual activity: Never  Other Topics Concern   Not on file  Social History Narrative   Has living will   Son Denise Henson, is health care POA   Would accept resuscitation  attempts   No tube feeds if cognitively unaware   Social Determinants of Health   Financial Resource Strain: Not on file  Food Insecurity: Not on file  Transportation Needs: Not on file  Physical Activity: Not on file  Stress: Not on file  Social Connections: Not on file  Intimate Partner Violence: Not on file     ROS- All systems are reviewed and negative except as per the HPI above.  Physical Exam:  Vitals:   01/11/22 1004  BP: 126/72  Pulse: 63  Weight: 83.3 kg  Height: '5\' 4"'$  (1.626 m)     GEN- The patient is a well appearing elderly obese female, alert and oriented x 3 today.   HEENT-head normocephalic, atraumatic, sclera clear, conjunctiva pink, hearing intact, trachea midline. Lungs- Clear to ausculation bilaterally, normal work of breathing Heart- Regular rate and rhythm, no murmurs, rubs or gallops  GI- soft, NT, ND, + BS Extremities- no clubbing, cyanosis, or edema MS- no significant deformity or atrophy Skin- no rash or lesion Psych- euthymic mood, full affect Neuro- strength and sensation are intact   Wt Readings from Last 3 Encounters:  01/11/22 83.3 kg  01/02/22 84.9 kg  01/02/22 85.1 kg    EKG today demonstrates  SR, NST Vent. rate 63 BPM PR interval 170 ms QRS duration 86 ms QT/QTcB 438/448 ms  Echo 04/27/21 demonstrated   1. Left ventricular ejection fraction, by estimation, is 50 to 55%. The  left ventricle has low normal function. The left ventricle has no regional  wall motion abnormalities. There is mild left ventricular hypertrophy.  Indeterminate diastolic filling due to E-A fusion.   2. Right ventricular systolic function is mildly reduced. The right  ventricular size is normal. There is normal pulmonary artery systolic  pressure.   3. Left atrial size was mildly dilated.   4. Right atrial size was mildly dilated.   5. The mitral valve is normal in structure. Mild mitral valve  regurgitation.   6. The aortic valve is normal in  structure. Aortic valve regurgitation is  mild. Mild aortic valve sclerosis is present, with no evidence of aortic  valve stenosis.   7. The inferior vena cava is normal in size with greater than 50%  respiratory variability, suggesting right atrial pressure of 3 mmHg.   Epic records are reviewed at length today  CHA2DS2-VASc Score = 5  The patient's score is based upon: CHF History: 0 HTN History: 1 Diabetes History: 0 Stroke History: 0 Vascular Disease History: 1 Age Score: 2 Gender Score: 1       ASSESSMENT AND PLAN: 1. Atypical atrial flutter The patient's CHA2DS2-VASc score is 5, indicating a 7.2% annual risk of stroke.   S/p dofetilide admission 7/10-7/13/23 Patient appears to be maintaining SR. Continue dofetilide 250 mcg BID. QT stable. Check bmet/mag today. Continue Eliquis 5 mg BID  2. Secondary Hypercoagulable State (ICD10:  D68.69) The patient is at significant risk for stroke/thromboembolism based upon her CHA2DS2-VASc Score of 5.  Continue Apixaban (Eliquis).   3. Obesity Body mass index is 31.51 kg/m. Lifestyle modification was discussed and encouraged including regular physical activity and weight reduction.  4. CAD S/p CABG No anginal symptoms.  5. HTN Stable, no changes today.   Follow up with Dr Lovena Le as scheduled. AF clinic in 4 months.    [facility] [address] [place], [place] [address] [phone] 01/11/2022 10:27 AM

## 2022-03-02 ENCOUNTER — Encounter: Payer: Self-pay | Admitting: Internal Medicine

## 2022-03-02 ENCOUNTER — Ambulatory Visit: Payer: Medicare HMO | Attending: Internal Medicine | Admitting: Internal Medicine

## 2022-03-02 VITALS — BP 174/80 | HR 61 | Ht 64.0 in | Wt 188.4 lb

## 2022-03-02 DIAGNOSIS — I4891 Unspecified atrial fibrillation: Secondary | ICD-10-CM | POA: Diagnosis not present

## 2022-03-02 DIAGNOSIS — I484 Atypical atrial flutter: Secondary | ICD-10-CM | POA: Diagnosis not present

## 2022-03-02 DIAGNOSIS — I10 Essential (primary) hypertension: Secondary | ICD-10-CM | POA: Diagnosis not present

## 2022-03-02 LAB — BASIC METABOLIC PANEL
BUN/Creatinine Ratio: 20 (ref 12–28)
BUN: 18 mg/dL (ref 8–27)
CO2: 24 mmol/L (ref 20–29)
Calcium: 9.5 mg/dL (ref 8.7–10.3)
Chloride: 104 mmol/L (ref 96–106)
Creatinine, Ser: 0.92 mg/dL (ref 0.57–1.00)
Glucose: 91 mg/dL (ref 70–99)
Potassium: 4.6 mmol/L (ref 3.5–5.2)
Sodium: 141 mmol/L (ref 134–144)
eGFR: 65 mL/min/{1.73_m2} (ref >59)

## 2022-03-02 NOTE — Progress Notes (Signed)
HPI Denise Henson returns today for followup of her left atrial flutter. She is a pleasant 76 yo woman with LA flutter discovered at EP study with a h/o HTN. She was placed on anti-coagulation. She had recurrent atrial flutter after her EPS/DCCV and was placed on amiodarone. She has taken 3 doses total, deciding that she did not really want to take after reading the package insert. She has not had chest pain, sob or edema. Rare palpitations. She was then placed on dofetilide. She is better. She still has some breakthru arrhythmias but overall fairly well controlled. She checks her bp at home and it is usually well controlled.  Allergies  Allergen Reactions   Codeine Anxiety    Jittery    Morphine Anxiety    jittery   Other Anxiety    Opiates - Jittery, Anxiety   Zestril [Lisinopril] Cough     Current Outpatient Medications  Medication Sig Dispense Refill   acetaminophen (TYLENOL) 650 MG CR tablet Take 1,300 mg by mouth 2 (two) times daily as needed for pain.     apixaban (ELIQUIS) 5 MG TABS tablet Take 1 tablet (5 mg total) by mouth 2 (two) times daily. 180 tablet 1   Calcium Carb-Cholecalciferol (CALCIUM + VITAMIN D3 PO) Take 1 tablet by mouth daily.     dofetilide (TIKOSYN) 250 MCG capsule Take 1 capsule (250 mcg total) by mouth 2 (two) times daily. 90 capsule 1   ferrous sulfate (SLOW RELEASE IRON) 160 (50 Fe) MG TBCR SR tablet Take 160 mg by mouth every Monday, Wednesday, and Friday.     Glucosamine 500 MG CAPS Take 500 mg by mouth daily.     losartan (COZAAR) 50 MG tablet Take 1 tablet (50 mg total) by mouth daily. 90 tablet 3   Omega-3 Fatty Acids (FISH OIL PO) Take 1 capsule by mouth daily.     Turmeric (QC TUMERIC COMPLEX PO) Take 1 capsule by mouth See admin instructions. Take 1 tablet daily when staying home and 1 tablet twice daily when traveling     No current facility-administered medications for this visit.     Past Medical History:  Diagnosis Date   Allergic  rhinitis due to pollen    Allergy    Anemia    Arthritis    Asthma    as a child   Atypical atrial flutter ([place])    a. 03/2021 EPS: L sided Aflutter; b. 04/2021 s/p DCCV; c. CHA2DS2VASc = 5-->eliquis. Has been using amio prn.   Coronary artery disease    a. 2007 [facility] [place] b. 12/2017 MV: Neg.   GERD (gastroesophageal reflux disease)    Hx of pleural effusion 2007   after CABG   Hyperlipidemia    Unable to tolerate simvastatin 40 due to myalgias   Hypertension    Mild Aortic insufficiency    a. 04/2021 Echo: EF 50-55%, no rwma, mild LVH, nl RV fxn, mild BAE, mild MR/AI.   Mild Mitral regurgitation     ROS:   All systems reviewed and negative except as noted in the HPI.   Past Surgical History:  Procedure Laterality Date   ATRIAL TACH ABLATION N/A 04/25/2021   Procedure: ATRIAL TACH ABLATION;  Surgeon: Evans Lance, MD;  Location: [facility];  Service: Cardiovascular;  Laterality: N/A;   CARDIOVERSION N/A 04/27/2021   Procedure: CARDIOVERSION;  Surgeon: Sanda Klein, MD;  Location: [facility];  Service: Cardiovascular;  Laterality: N/A;   CESAREAN SECTION     CHOLECYSTECTOMY     COLONOSCOPY     CORONARY ARTERY BYPASS GRAFT  5/07   Readmitted for post op pleural effusions   DOPPLER ECHOCARDIOGRAPHY     NV LV EF 1+ AI/MR   EXCISION METACARPAL MASS Left 02/21/2018   Procedure: EXCISION METACARPAL MASS;  Surgeon: Hessie Knows, MD;  Location: [facility];  Service: Orthopedics;  Laterality: Left;   Stress imaging  6/07   Negative EF 67%   TEE WITHOUT CARDIOVERSION N/A 04/27/2021   Procedure: TRANSESOPHAGEAL ECHOCARDIOGRAM (TEE);  Surgeon: Sanda Klein, MD;  [facility];  Service: Cardiovascular;  Laterality: N/A;   TONSILLECTOMY       Family History  Problem Relation Age of Onset   Heart attack Mother 71   Heart attack Maternal Grandmother    Cancer Neg Hx        Breast or colon   Diabetes Neg Hx     Hypertension Neg Hx    Colon cancer Neg Hx    Colon polyps Neg Hx    Esophageal cancer Neg Hx    Rectal cancer Neg Hx    Stomach cancer Neg Hx      Social History   Socioeconomic History   Marital status: Widowed    Spouse name: Not on file   Number of children: 1   Years of education: Not on file   Highest education level: Not on file  Occupational History   Occupation: Pharmacist, 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[place])    Employer: RETIRED    Comment: Retired  Tobacco Use   Smoking status: Former    Types: Cigarettes    Quit date: 06/26/1994    Years since quitting: 27.7   Smokeless tobacco: Never   Tobacco comments:    Former smoker 12/28/21  Vaping Use   Vaping Use: Never used  Substance and Sexual Activity   Alcohol use: Not Currently    Alcohol/week: 7.0 - 14.0 standard drinks of alcohol    Types: 7 - 14 Standard drinks or equivalent per week    Comment: 1-2 drinks daily 12/28/21   Drug use: No   Sexual activity: Never  Other Topics Concern   Not on file  Social History Narrative   Has living will   Son Denise Henson, is health care POA   Would accept resuscitation attempts   No tube feeds if cognitively unaware   Social Determinants of Health   Financial Resource Strain: Not on file  Food Insecurity: Not on file  Transportation Needs: Not on file  Physical Activity: Not on file  Stress: Not on file  Social Connections: Not on file  Intimate Partner Violence: Not on file     BP (!) 174/80   Pulse 61   Ht '5\' 4"'$  (1.626 m)   Wt 188 lb 6.4 oz (85.5 kg)   SpO2 100%   BMI 32.34 kg/m   Physical Exam:  Well appearing NAD HEENT: Unremarkable Neck:  No JVD, no thyromegally Lymphatics:  No adenopathy Back:  No CVA tenderness Lungs:  Clear HEART:  Regular rate rhythm, no murmurs, no rubs, no clicks Abd:  soft, positive bowel sounds, no organomegally, no rebound, no guarding Ext:  2 plus pulses, no edema, no cyanosis, no clubbing Skin:  No rashes no nodules Neuro:  CN II  through XII intact, motor grossly intact  EKG  DEVICE  Normal device function.  See PaceArt for details.   Assess/Plan:  LA flutter -  She will continue dofetilide. Her symptoms are fairly well controlled. CAD - she denies anginal symptoms. We will follow. She is s/p CABG HTN - her bp is controlled. We will follow. Obesity - her bmi is over 30. She needs to lose weight.   Denise Overlie Jermall Isaacson,MD

## 2022-03-02 NOTE — Patient Instructions (Addendum)
Medication Instructions:  Your physician recommends that you continue on your current medications as directed. Please refer to the Current Medication list given to you today.  *If you need a refill on your cardiac medications before your next appointment, please call your pharmacy*  Lab Work: You will have blood work drawn today:  BMET   Testing/Procedures: None ordered.  Follow-Up: At [facility], you and your health needs are our priority.  As part of our continuing mission to provide you with exceptional heart care, we have created designated Provider Care Teams.  These Care Teams include your primary Cardiologist (physician) and Advanced Practice Providers (APPs -  Physician Assistants and Nurse Practitioners) who all work together to provide you with the care you need, when you need it.  We recommend signing up for the patient portal called "MyChart".  Sign up information is provided on this After Visit Summary.  MyChart is used to connect with patients for Virtual Visits (Telemedicine).  Patients are able to view lab/test results, encounter notes, upcoming appointments, etc.  Non-urgent messages can be sent to your provider as well.   To learn more about what you can do with MyChart, go to [email or web address].    Your next appointment:   Please schedule a follow up appointment in 6 Months with Dr. Cristopher Peru.  The format for your next appointment:   In Person  Provider:   Cristopher Peru, MD{or one of the following Advanced Practice Providers on your designated Care Team:   Tommye Standard, [place] Legrand Como "Jonni Sanger" Chalmers Cater, [place]   Important Information About Sugar      Basic Metabolic Panel Why am I having this test? A basic metabolic panel test is done to learn important information about your metabolism. This includes information on your kidney function, blood glucose level, and levels of important minerals (electrolytes). What is being tested? This test measures the  levels of the following substances in your blood: Glucose. Glucose is a simple sugar that serves as the main source of energy for your body. Creatinine. Creatinine is a waste product of normal muscle activity. It is excreted from the body by the kidneys. Blood urea nitrogen (BUN). Urea nitrogen is a waste product of protein breakdown. It is produced when excess protein in your body is broken down and used for energy. It is excreted by the kidneys. Electrolytes. Electrolytes are minerals in your body that help keep the amount of water in your body in balance. They also help move nutrients into, and waste out of, the body, and help muscles and nerves function properly. The electrolytes measured in a basic metabolic panel include: Potassium. Sodium. Chloride. Calcium. Bicarbonate. What kind of sample is taken?  A blood sample is required for this test. It is usually collected by inserting a needle into a blood vessel. How do I prepare for this test? You may need to stop eating or drinking for a certain amount of time before your blood sample is taken. Follow instructions from your health care provider about eating and drinking. How are the results reported? Your test results will be reported as values for each component of the basic metabolic panel. Your health care provider will compare your results to normal ranges that were established after testing a large group of people (reference ranges). Reference ranges may vary among labs and hospitals. Abnormal values will be flagged. For this test, common reference ranges for each component are: Glucose Cord: 45-96 mg/dL or 2.5-5.3 mmol/L (SI units). Premature infant: 20-60 mg/dL  or 1.1-3.3 mmol/L. Neonate: 30-60 mg/dL or 1.7-3.3 mmol/L. Infant: 40-90 mg/dL or 2.2-5 mmol/L. Child under 70 years old: 60-100 mg/dL or 3.3-5.5 mmol/L. Adult or child over 83 years old: Fasting: 70-110 mg/dL or less than 6.1 mmol/L. Random (non-fasting or casual): less than  or equal to 200 mg/dL or less than 11.1 mmol/L. Creatinine Newborn: 0.3-1.2 mg/dL. Infant: 0.2-0.4 mg/dL. Child: 0.3-0.7 mg/dL. Adolescent: 0.5-1 mg/dL. Adult: Female: 0.5-1.1 mg/dL or 44-97 micromoles/L (SI units). Female: 0.6-1.2 mg/dL or 53-106 micromoles/L (SI units). BUN Cord: 21-40 mg/dL. Newborn: 3-12 mg/dL. Infant: 5-18 mg/dL. Child: 5-18 mg/dL. Adult: 10-20 mg/dL or 3.6-7.1 mmol/L (SI units). Potassium (K+) Newborn: 3.9-5.9 mEq/L. Infant: 4.1-5.3 mEq/L. Child: 3.4-4.7 mEq/L. Adult or elderly: 3.5-5 mEq/L or 3.5-5 mmol/L (SI units). Sodium (Na+) Newborn: 134-144 mEq/L. Infant: 134-150 mEq/L. Child: 136-145 mEq/L. Adult or elderly: 136-145 mEq/L or 136-145 mmol/L (SI units). Chloride (Cl-) Premature infant: 95-110 mEq/L. Newborn: 96-106 mEq/L. Child: 90-110 mEq/L. Adult or elderly: 98-106 mEq/L or 98-106 mmol/L (SI units). Calcium (Ca) Total calcium: Cord: 9-11.5 mg/dL or 2.25-2.88 mmol/L. Newborn under 17 days old: 7.6-10.4 mg/dL or 1.9-2.6 mmol/L. 58 days to 77 years old: 9-10.6 mg/dL or 2.30-2.65 mmol/L. Child: 8.8-10.8 mg/dL or 2.2-2.7 mmol/L. Adult: 9-10.5 mg/dL or 2.25-2.62 mmol/L. Ionized calcium: Newborn: 4.20-5.58 mg/dL or 1.05-1.37 mmol/L. 2 months to 77 years old: 4.80-5.52 mg/dL or 1.20-1.38 mmol/L. Adult: 4.5-5.6 mg/dL or 1.05-1.30 mmol/L. Bicarbonate (HCO3-) Newborn: 13-22 mEq/L. Infant: 20-28 mEq/L. Child: 20-28 mEq/L. Adult or elderly: 23-30 mEq/L or 23-30 mmol/L (SI units). What do the results mean? Diet and levels of activity can affect your test results. Sometimes they can be the cause of values that are outside of normal limits. However, values outside of normal limits may indicate a medical disorder. Glucose Abnormally high glucose levels (hyperglycemia) are usually associated with prediabetes mellitus and diabetes mellitus. They can also occur with severe stress on the body. This can result from surgery or events such as stroke or trauma.  Overactive thyroid gland and pancreatitis or pancreatic cancer can also cause abnormally high glucose levels. Abnormally low glucose levels (hypoglycemia) can occur with underactive thyroid gland and rare insulin-secreting tumors (insulinoma). Creatinine Abnormally high creatinine levels are most often seen in kidney failure. They can also be seen with overactive thyroid (hyperthyroidism), conditions related to overgrowth of the body, abnormal breakdown of muscle tissue, and early-stage muscular dystrophy. Abnormally low creatinine levels can indicate low muscle mass associated with malnutrition or late-stage muscular dystrophy. BUN Abnormally high BUN levels generally mean that your kidneys are not functioning normally. Abnormally low BUN levels can be seen with malnutrition and liver failure. Potassium Abnormally high potassium levels are most often seen with kidney disease. Other causes include massive destruction of red blood cells, and adrenal gland failure. Abnormally low potassium levels can be seen with excessive levels of the hormone aldosterone. Sodium Abnormally high sodium levels can be seen with dehydration, excessive thirst, and urination due to abnormally low levels of antidiuretic hormone (diabetes insipidus). They can also be seen with excessive levels of aldosterone or cortisol in the body. Abnormally low levels of sodium can be seen with congestive heart failure, cirrhosis of the liver, kidney failure, and the syndrome of inappropriate antidiuretic hormone (SIADH). Chloride Abnormally high levels of chloride can be seen with acute kidney failure, diabetes insipidus, prolonged diarrhea, and poisoning with aspirin or bromide. Abnormally low levels of chloride can be seen with prolonged vomiting, acute adrenal gland failure, and SIADH. Calcium Abnormally high levels of calcium can  occur with excessive activity of the parathyroid glands, certain cancers, and a type of inflammation  seen in sarcoidosis and tuberculosis. Abnormally low levels of calcium can be seen with underactive parathyroid glands, vitamin D deficiency, and acute pancreatitis. Bicarbonate Abnormally high bicarbonate levels are seen after prolonged vomiting and diuretic therapy, which lead to a decrease in the amount of acid in the body (metabolic alkalosis). They can also be seen in conditions that increase the amount of bicarbonate in the body. These conditions include rare hereditary disorders that interfere with how your kidneys handle electrolytes. Abnormally low bicarbonate levels are seen with conditions that cause your body to produce too much acid (metabolic acidosis). These conditions include uncontrolled diabetes mellitus and poisoning with aspirin, methanol, or antifreeze (ethylene glycol). Talk with your health care provider about what your results mean. Questions to ask health care provider Ask your health care provider, or the department that is doing the test: When will my results be ready? How will I get my results? What are my treatment options? What other tests do I need? What are my next steps? Summary A basic metabolic panel test is performed to learn about your kidney function, blood glucose level, and levels of important minerals (electrolytes). You may need to stop eating or drinking for a certain amount of time before your blood sample is taken. Follow instructions from your health care provider about eating or drinking. Abnormally low or abnormally high levels of the tested elements may indicate certain diseases and conditions. Talk with your health care provider about what your results mean. This information is not intended to replace advice given to you by your health care provider. Make sure you discuss any questions you have with your health care provider. Document Revised: 04/26/2021 Document Reviewed: 11/06/2019 Elsevier Patient Education  [place].

## 2022-03-27 ENCOUNTER — Encounter: Payer: Self-pay | Admitting: Internal Medicine

## 2022-03-31 ENCOUNTER — Encounter: Payer: Self-pay | Admitting: Internal Medicine

## 2022-04-03 ENCOUNTER — Encounter: Payer: Self-pay | Admitting: Internal Medicine

## 2022-04-03 ENCOUNTER — Other Ambulatory Visit: Payer: Self-pay | Admitting: *Deleted

## 2022-04-03 MED ORDER — DOFETILIDE 250 MCG PO CAPS: 250.0000 ug | ORAL_CAPSULE | Freq: Two times a day (BID) | ORAL | 1 refills | Status: DC

## 2022-04-07 ENCOUNTER — Other Ambulatory Visit: Payer: Self-pay

## 2022-04-07 ENCOUNTER — Encounter: Payer: Self-pay | Admitting: Internal Medicine

## 2022-04-07 DIAGNOSIS — I484 Atypical atrial flutter: Secondary | ICD-10-CM

## 2022-04-07 MED ORDER — APIXABAN 5 MG PO TABS: 5.0000 mg | ORAL_TABLET | Freq: Two times a day (BID) | ORAL | 1 refills | Status: DC

## 2022-04-07 NOTE — Telephone Encounter (Signed)
Prescription refill request for Eliquis received. Indication:Afib Last office visit:9/23 Scr:0.9 Age: 76 Weight:85.5 kg  Prescription refilled

## 2022-04-24 ENCOUNTER — Ambulatory Visit (INDEPENDENT_AMBULATORY_CARE_PROVIDER_SITE_OTHER): Payer: Medicare HMO | Admitting: Internal Medicine

## 2022-04-24 ENCOUNTER — Encounter: Payer: Self-pay | Admitting: Internal Medicine

## 2022-04-24 VITALS — BP 136/72 | HR 64 | Temp 97.7°F | Ht 63.75 in | Wt 189.0 lb

## 2022-04-24 DIAGNOSIS — I700 Atherosclerosis of aorta: Secondary | ICD-10-CM

## 2022-04-24 DIAGNOSIS — I480 Paroxysmal atrial fibrillation: Secondary | ICD-10-CM | POA: Diagnosis not present

## 2022-04-24 DIAGNOSIS — I10 Essential (primary) hypertension: Secondary | ICD-10-CM | POA: Diagnosis not present

## 2022-04-24 DIAGNOSIS — K219 Gastro-esophageal reflux disease without esophagitis: Secondary | ICD-10-CM

## 2022-04-24 DIAGNOSIS — M159 Polyosteoarthritis, unspecified: Secondary | ICD-10-CM

## 2022-04-24 DIAGNOSIS — Z0000 Encounter for general adult medical examination without abnormal findings: Secondary | ICD-10-CM

## 2022-04-24 MED ORDER — TRIAMCINOLONE ACETONIDE 0.1 % EX CREA: 1.0000 | TOPICAL_CREAM | Freq: Two times a day (BID) | CUTANEOUS | 1 refills | Status: DC | PRN

## 2022-04-24 NOTE — Assessment & Plan Note (Signed)
BP Readings from Last 3 Encounters:  04/24/22 136/72  03/02/22 (!) 174/80  01/11/22 126/72   Doing okay on the losartan 50 Recent labs okay

## 2022-04-24 NOTE — Assessment & Plan Note (Signed)
I have personally reviewed the Medicare Annual Wellness questionnaire and have noted 1. The patient's medical and social history 2. Their use of alcohol, tobacco or illicit drugs 3. Their current medications and supplements 4. The patient's functional ability including ADL's, fall risks, home safety risks and hearing or visual             impairment. 5. Diet and physical activities 6. Evidence for depression or mood disorders  The patients weight, height, BMI and visual acuity have been recorded in the chart I have made referrals, counseling and provided education to the patient based review of the above and I have provided the pt with a written personalized care plan for preventive services.  I have provided you with a copy of your personalized plan for preventive services. Please take the time to review along with your updated medication list.  She is done with cancer screening Does regular exercise Had updated COVID and flu vaccines already

## 2022-04-24 NOTE — Patient Instructions (Signed)
You can try over the counter diclofenac gel for your painful joints.

## 2022-04-24 NOTE — Progress Notes (Signed)
Hearing Screening - Comments:: Passed whisper test Vision Screening - Comments:: January 2023  

## 2022-04-24 NOTE — Assessment & Plan Note (Signed)
Mostly in sinus now on tikosyn eliquis  5 bid

## 2022-04-24 NOTE — Progress Notes (Signed)
Subjective:    Patient ID: Denise Henson, female    DOB: 12-18-1945, 76 y.o.   MRN: [id number]  HPI Here for Medicare wellness visit and follow up of chronic health conditions Reviewed advanced directives Reviewed other doctors---Dr [place] cardi[facility], Dr Irven Coe, [facility] Hospitalized in July for Tikosyn initiation. No other hospital stays and no surgery Walks every day -- 1-3 miles per day Vision is okay Hearing is fine Still enjoys scotch daily--- 1-2 daily No tobacco  No falls On depression or anhedonia Independent with instrumental ADLs No sig memory issues  Was planning ablation but that was canceled (I think they found atrial fib) Has done well on the tikosyn Does have episodic spells of palpitations---but seems to be regular and no more than 30-45 minutes. No trigger Tikosyn has affected her stamina---and some lightheadedness Has to moderate her activity some (choose terrain for walking, etc) No chest pain Does get DOE easier since on Tikosyn  Went to [place] for 6 weeks--part of it with her kids (her gift to 69 year old son) Also went to [place] Then recently in the [place] to [place] soon Other trips are planned  Having pain in her right hip With activity and even in bed Takes tylenol---not much help Seems worse with immobility---not a big deal during walking  Current Outpatient Medications on File Prior to Visit  Medication Sig Dispense Refill   acetaminophen (TYLENOL) 650 MG CR tablet Take 1,300 mg by mouth 2 (two) times daily as needed for pain.     apixaban (ELIQUIS) 5 MG TABS tablet Take 1 tablet (5 mg total) by mouth 2 (two) times daily. 180 tablet 1   Calcium Carb-Cholecalciferol (CALCIUM + VITAMIN D3 PO) Take 1 tablet by mouth daily.     dofetilide (TIKOSYN) 250 MCG capsule Take 1 capsule (250 mcg total) by mouth 2 (two) times daily. 180 capsule 1   ferrous sulfate (SLOW RELEASE IRON) 160 (50 Fe) MG TBCR SR tablet Take  160 mg by mouth every Monday, Wednesday, and Friday.     Glucosamine 500 MG CAPS Take 500 mg by mouth daily.     losartan (COZAAR) 50 MG tablet Take 1 tablet (50 mg total) by mouth daily. 90 tablet 3   Omega-3 Fatty Acids (FISH OIL PO) Take 1 capsule by mouth daily.     Turmeric (QC TUMERIC COMPLEX PO) Take 1 capsule by mouth See admin instructions. Take 1 tablet daily when staying home and 1 tablet twice daily when traveling     No current facility-administered medications on file prior to visit.    Allergies  Allergen Reactions   Codeine Anxiety    Jittery    Morphine Anxiety    jittery   Other Anxiety    Opiates - Jittery, Anxiety   Zestril [Lisinopril] Cough    Past Medical History:  Diagnosis Date   Allergic rhinitis due to pollen    Allergy    Anemia    Arthritis    Asthma    as a child   Atypical atrial flutter ([place])    a. 03/2021 EPS: L sided Aflutter; b. 04/2021 s/p DCCV; c. CHA2DS2VASc = 5-->eliquis. Has been using amio prn.   Coronary artery disease    [facility]. [facility] [place] b. 12/2017 MV: Neg.   GERD (gastroesophageal reflux disease)    Hx of pleural effusion 2007   after CABG   Hyperlipidemia    Unable to tolerate simvastatin  39 due to myalgias   Hypertension    Mild Aortic insufficiency    a. 04/2021 Echo: EF 50-55%, no rwma, mild LVH, nl RV fxn, mild BAE, mild MR/AI.   Mild Mitral regurgitation     Past Surgical History:  Procedure Laterality Date   ATRIAL TACH ABLATION N/A 04/25/2021   Procedure: ATRIAL TACH ABLATION;  Surgeon: Evans Lance, MD;  Location: [facility];  Service: Cardiovascular;  Laterality: N/A;   CARDIOVERSION N/A 04/27/2021   Procedure: CARDIOVERSION;  Surgeon: Sanda Klein, MD;  Location: [facility];  Service: Cardiovascular;  Laterality: N/A;   CESAREAN SECTION     CHOLECYSTECTOMY     COLONOSCOPY     CORONARY ARTERY BYPASS GRAFT  5/07   Readmitted for post op pleural effusions    DOPPLER ECHOCARDIOGRAPHY     NV LV EF 1+ AI/MR   EXCISION METACARPAL MASS Left 02/21/2018   Procedure: EXCISION METACARPAL MASS;  Surgeon: Hessie Knows, MD;  Location: [facility];  Service: Orthopedics;  Laterality: Left;   Stress imaging  6/07   Negative EF 67%   TEE WITHOUT CARDIOVERSION N/A 04/27/2021   Procedure: TRANSESOPHAGEAL ECHOCARDIOGRAM (TEE);  Surgeon: Sanda Klein, MD;  Location: [facility];  Service: Cardiovascular;  Laterality: N/A;   TONSILLECTOMY      Family History  Problem Relation Age of Onset   Heart attack Mother 33   Heart attack Maternal Grandmother    Cancer Neg Hx        Breast or colon   Diabetes Neg Hx    Hypertension Neg Hx    Colon cancer Neg Hx    Colon polyps Neg Hx    Esophageal cancer Neg Hx    Rectal cancer Neg Hx    Stomach cancer Neg Hx     Social History   Socioeconomic History   Marital status: Widowed    Spouse name: Not on file   Number of children: 1   Years of education: Not on file   Highest education level: Not on file  Occupational History   Occupation: Pharmacist, 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[place])    Employer: RETIRED    Comment: Retired  Tobacco Use   Smoking status: Former    Types: Cigarettes    Quit date: 06/26/1994    Years since quitting: 27.8    Passive exposure: Never   Smokeless tobacco: Never   Tobacco comments:    Former smoker 12/28/21  Vaping Use   Vaping Use: Never used  Substance and Sexual Activity   Alcohol use: Not Currently    Alcohol/week: 7.0 - 14.0 standard drinks of alcohol    Types: 7 - 14 Standard drinks or equivalent per week    Comment: 1-2 drinks daily 12/28/21   Drug use: No   Sexual activity: Never  Other Topics Concern   Not on file  Social History Narrative   Has living will   Son Denise Henson, is health care POA   Would accept resuscitation attempts   No tube feeds if cognitively unaware   Social Determinants of Health   Financial Resource Strain: Not on file  Food Insecurity: Not on file   Transportation Needs: Not on file  Physical Activity: Not on file  Stress: Not on file  Social Connections: Not on file  Intimate Partner Violence: Not on file   Review of Systems Appetite is fine Weight is stable Sleeps well---occ joint pain Nocturia x 2-3---no urinary problems though Wears seat belt Teeth are okay----keeps up  with dentist No recent problems with heartburn. Uses prn only. No dysphagia Bowels move fine Has some dry, itchy spots on legs. OTC cortisone not enough    Objective:   Physical Exam Constitutional:      Appearance: Normal appearance.  HENT:     Mouth/Throat:     Comments: No lesions Eyes:     Conjunctiva/sclera: Conjunctivae normal.     Pupils: Pupils are equal, round, and reactive to light.  Cardiovascular:     Rate and Rhythm: Normal rate and regular rhythm.     Pulses: Normal pulses.     Heart sounds: No murmur heard.    No gallop.  Pulmonary:     Effort: Pulmonary effort is normal.     Breath sounds: Normal breath sounds. No wheezing or rales.  Abdominal:     Palpations: Abdomen is soft.     Tenderness: There is no abdominal tenderness.  Musculoskeletal:     Cervical back: Neck supple.     Right lower leg: No edema.     Left lower leg: No edema.  Lymphadenopathy:     Cervical: No cervical adenopathy.  Skin:    Findings: No lesion or rash.  Neurological:     General: No focal deficit present.     Mental Status: She is alert and oriented to person, place, and time.     Comments: Mini-cog normal  Psychiatric:        Mood and Affect: Mood normal.        Behavior: Behavior normal.            Assessment & Plan:

## 2022-04-24 NOTE — Assessment & Plan Note (Signed)
On imaging No statin

## 2022-04-24 NOTE — Assessment & Plan Note (Signed)
Better Using the omeprazole daily only

## 2022-04-24 NOTE — Assessment & Plan Note (Signed)
Mild in hip and some in spine Discussed diclofenac gel prn

## 2022-04-28 ENCOUNTER — Encounter (HOSPITAL_COMMUNITY): Payer: Self-pay

## 2022-05-01 ENCOUNTER — Ambulatory Visit (HOSPITAL_COMMUNITY): Payer: Medicare HMO | Admitting: Physician Assistant

## 2022-05-17 ENCOUNTER — Other Ambulatory Visit: Payer: Self-pay | Admitting: *Deleted

## 2022-05-17 DIAGNOSIS — I484 Atypical atrial flutter: Secondary | ICD-10-CM

## 2022-05-17 MED ORDER — APIXABAN 5 MG PO TABS: 5.0000 mg | ORAL_TABLET | Freq: Two times a day (BID) | ORAL | 1 refills | Status: DC

## 2022-05-17 NOTE — Telephone Encounter (Signed)
Eliquis '5mg'$  refill request received. Patient is 76 years old, weight-85.7kg, Crea-0.92 on 03/02/2022, Diagnosis-Aflutter, and last seen by Dr. Lovena Le on 03/02/2022. Dose is appropriate based on dosing criteria. Will send in refill to requested pharmacy.

## 2022-05-27 IMAGING — CR DG CHEST 2V
1 series · 2 of 2 positions shown · non-contrast
Comparison: None.

CLINICAL DATA: Cough.

EXAM:
CHEST - 2 VIEW

[Series 1: dg chest 2 view · 0.14mm/px · 2 of 2 slices shown]
[im 1/2]
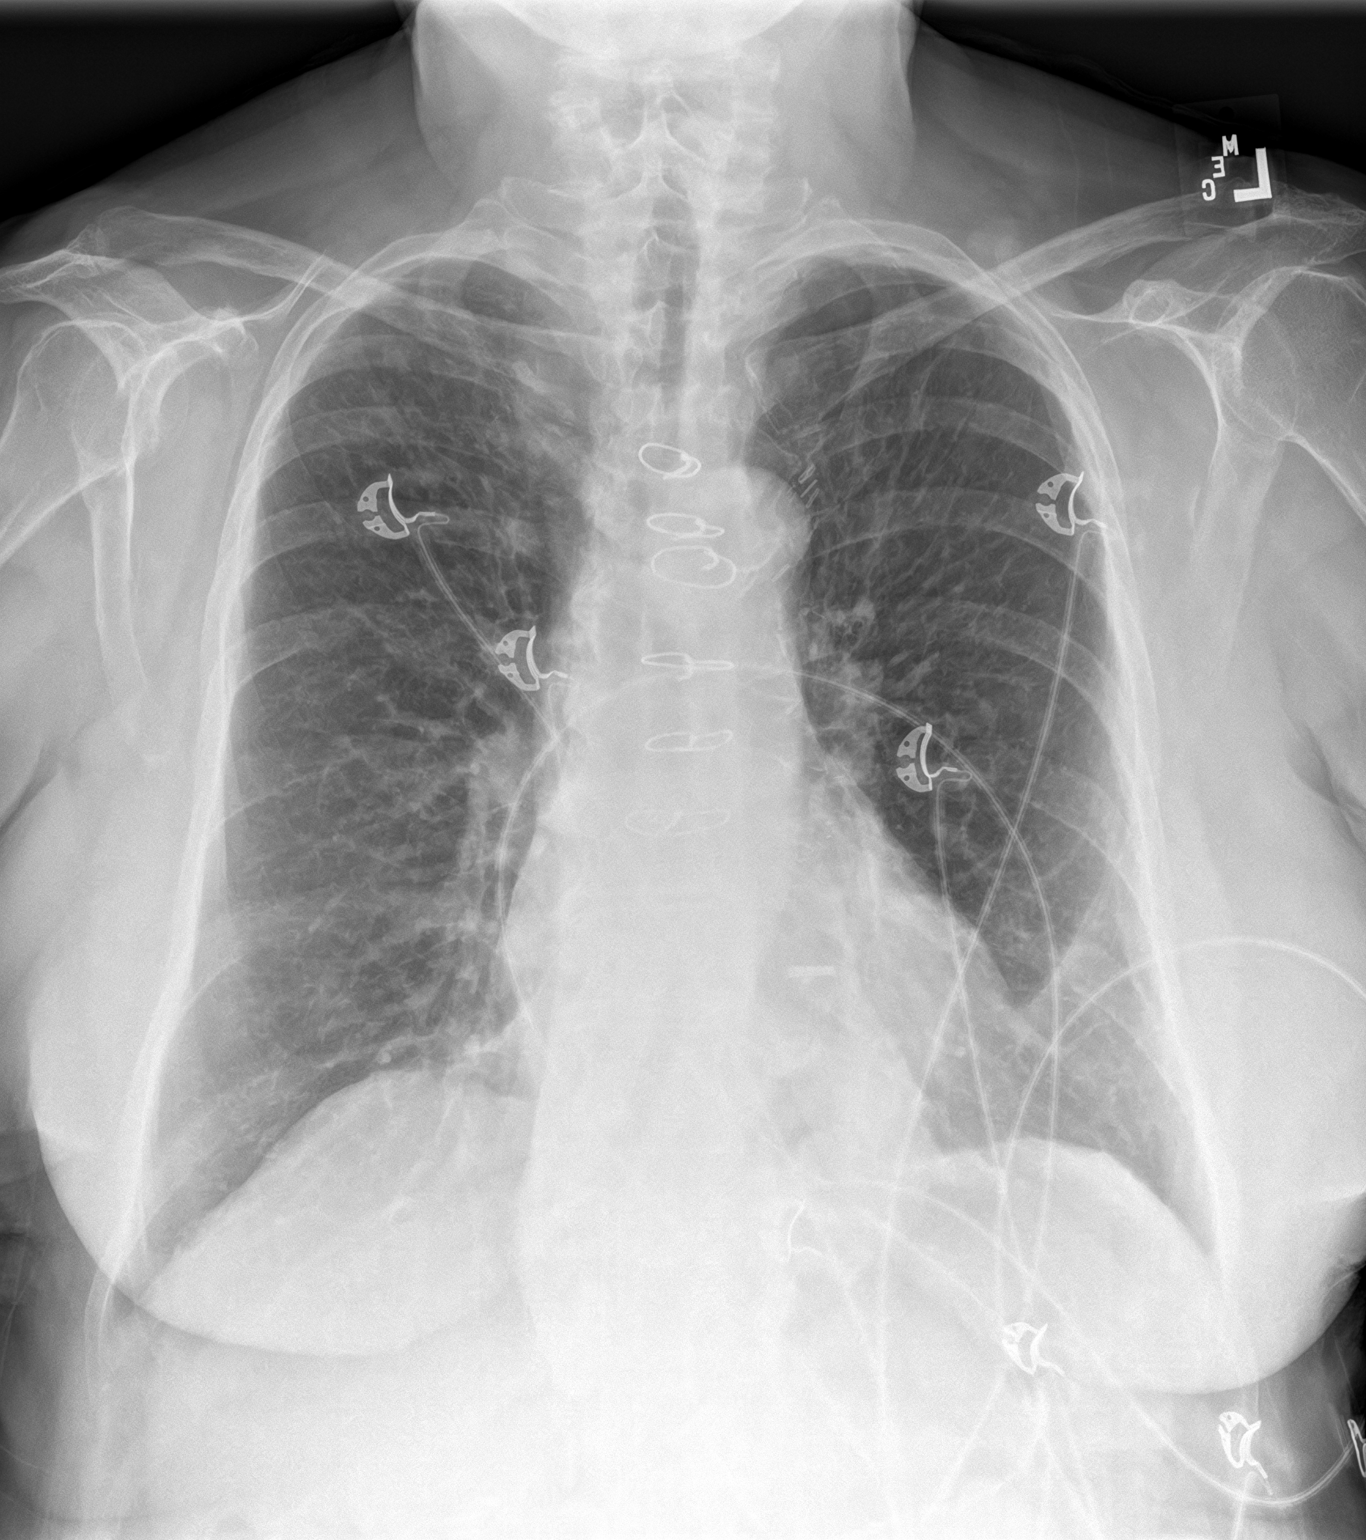
[im 2/2]
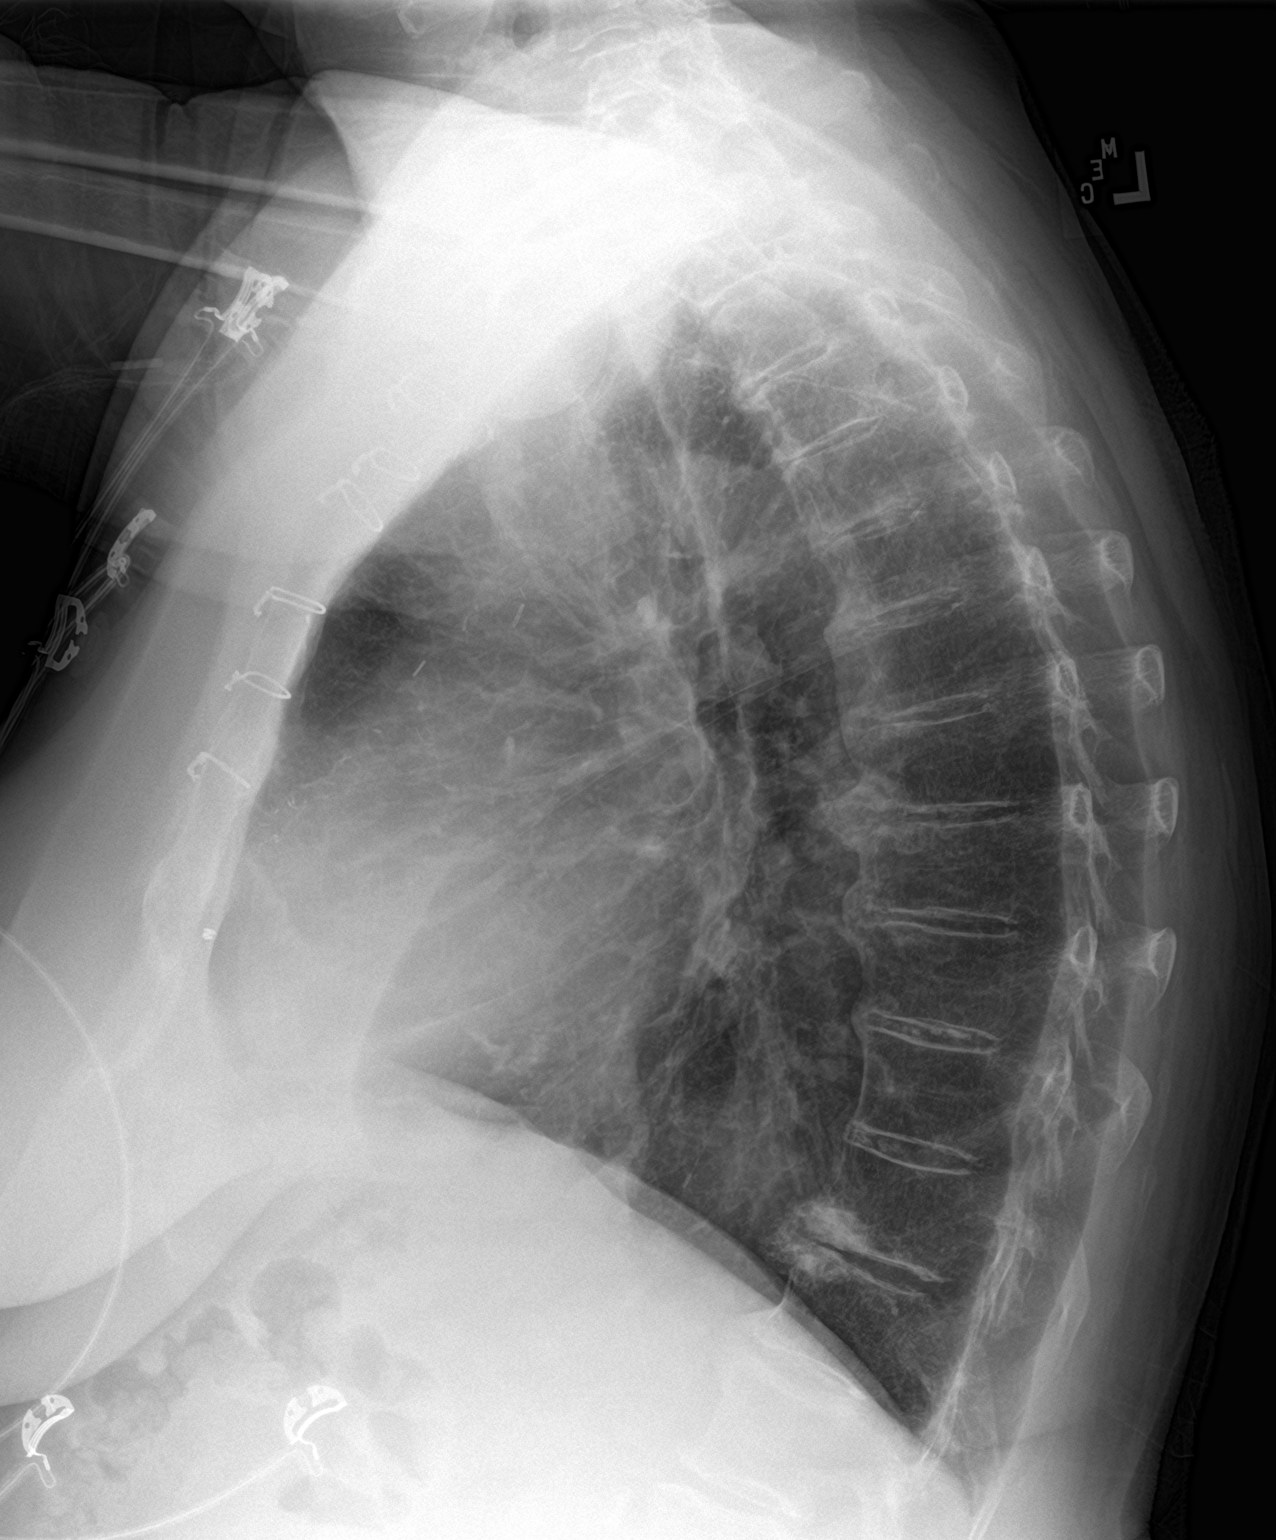

[2 of 2 positions shown; findings below may reference images not displayed]

FINDINGS: The heart size and mediastinal contours are within normal limits.
Both lungs are clear. Sternotomy wires are noted. The visualized
skeletal structures are unremarkable.
IMPRESSION: No active cardiopulmonary disease.

## 2022-05-30 ENCOUNTER — Ambulatory Visit (HOSPITAL_COMMUNITY)
Admission: RE | Admit: 2022-05-30 | Discharge: 2022-05-30 | Disposition: A | Discharge status: Home / Self Care | Payer: Medicare HMO | Source: Ambulatory Visit | Attending: Physician Assistant | Admitting: Physician Assistant

## 2022-05-30 ENCOUNTER — Encounter (HOSPITAL_COMMUNITY): Payer: Self-pay | Admitting: Physician Assistant

## 2022-05-30 VITALS — BP 136/76 | HR 64 | Ht 63.75 in | Wt 194.4 lb

## 2022-05-30 DIAGNOSIS — I10 Essential (primary) hypertension: Secondary | ICD-10-CM | POA: Insufficient documentation

## 2022-05-30 DIAGNOSIS — Z79899 Other long term (current) drug therapy: Secondary | ICD-10-CM | POA: Diagnosis not present

## 2022-05-30 DIAGNOSIS — E785 Hyperlipidemia, unspecified: Secondary | ICD-10-CM | POA: Diagnosis not present

## 2022-05-30 DIAGNOSIS — E669 Obesity, unspecified: Secondary | ICD-10-CM | POA: Insufficient documentation

## 2022-05-30 DIAGNOSIS — Z7901 Long term (current) use of anticoagulants: Secondary | ICD-10-CM | POA: Diagnosis not present

## 2022-05-30 DIAGNOSIS — I080 Rheumatic disorders of both mitral and aortic valves: Secondary | ICD-10-CM | POA: Diagnosis not present

## 2022-05-30 DIAGNOSIS — Z951 Presence of aortocoronary bypass graft: Secondary | ICD-10-CM | POA: Diagnosis not present

## 2022-05-30 DIAGNOSIS — D6869 Other thrombophilia: Secondary | ICD-10-CM | POA: Insufficient documentation

## 2022-05-30 DIAGNOSIS — Z6833 Body mass index (BMI) 33.0-33.9, adult: Secondary | ICD-10-CM | POA: Insufficient documentation

## 2022-05-30 DIAGNOSIS — I2510 Atherosclerotic heart disease of native coronary artery without angina pectoris: Secondary | ICD-10-CM | POA: Diagnosis not present

## 2022-05-30 DIAGNOSIS — I484 Atypical atrial flutter: Secondary | ICD-10-CM

## 2022-05-30 LAB — BASIC METABOLIC PANEL
Anion gap: 8 (ref 5–15)
BUN: 19 mg/dL (ref 8–23)
CO2: 23 mmol/L (ref 22–32)
Calcium: 9 mg/dL (ref 8.9–10.3)
Chloride: 108 mmol/L (ref 98–111)
Creatinine, Ser: 1.02 mg/dL — ABNORMAL HIGH (ref 0.44–1.00)
GFR, Estimated: 57 mL/min — ABNORMAL LOW (ref >60)
Glucose, Bld: 102 mg/dL — ABNORMAL HIGH (ref 70–99)
Potassium: 4 mmol/L (ref 3.5–5.1)
Sodium: 139 mmol/L (ref 135–145)

## 2022-05-30 LAB — MAGNESIUM: Magnesium: 1.9 mg/dL (ref 1.7–2.4)

## 2022-05-30 NOTE — Progress Notes (Signed)
Primary Care Physician: Venia Carbon, MD Primary Electrophysiologist: Dr Lovena Le Referring Physician: Tommye Standard PA   Denise Henson is a 76 y.o. female with a history of CAD s/p CABG 2007, HTN, HLD, atrial flutter who presents for follow up in the [facility]. Patient had been maintained on amiodarone but patient discontinued this due to concerns about off target effects. Patient is on Eliquis for a CHADS2VASC score of 5. She is referred to the AF clinic to discuss dofetilide admission. Patient is s/p dofetilide admission 7/10-7/13/23. She did not require DCCV.   On follow up today, patient reports that she has done reasonably well. She has tachypalpitations about once per week which last 30-45 minutes. She has not been able to identify any specific triggers for her episodes. No bleeding issues on anticoagulation.    Today, she denies symptoms of chest pain, shortness of breath, orthopnea, PND, lower extremity edema, presyncope, syncope, snoring, daytime somnolence, bleeding, or neurologic sequela. The patient is tolerating medications without difficulties and is otherwise without complaint today.    Atrial Fibrillation Risk Factors:  she does not have symptoms or diagnosis of sleep apnea. she does not have a history of rheumatic fever.   she has a BMI of Body mass index is 33.63 kg/m.Marland Kitchen Filed Weights   05/30/22 0936  Weight: 88.2 kg    Family History  Problem Relation Age of Onset   Heart attack Mother 52   Heart attack Maternal Grandmother    Cancer Neg Hx        Breast or colon   Diabetes Neg Hx    Hypertension Neg Hx    Colon cancer Neg Hx    Colon polyps Neg Hx    Esophageal cancer Neg Hx    Rectal cancer Neg Hx    Stomach cancer Neg Hx      Atrial Fibrillation Management history:  Previous antiarrhythmic drugs: amiodarone, dofetilide   Previous cardioversions: 04/27/21 Previous ablations: 04/25/21 Atach CHADS2VASC score:  5 Anticoagulation history: Eliquis   Past Medical History:  Diagnosis Date   Allergic rhinitis due to pollen    Allergy    Anemia    Arthritis    Asthma    as a child   Atypical atrial flutter ([place])    a. 03/2021 EPS: L sided Aflutter; b. 04/2021 s/p DCCV; c. CHA2DS2VASc = 5-->eliquis. Has been using amio prn.   Coronary artery disease    a. [facility] [place] b. 12/2017 MV: Neg.   GERD (gastroesophageal reflux disease)    Hx of pleural effusion 2007   after CABG   Hyperlipidemia    Unable to tolerate simvastatin 40 due to myalgias   Hypertension    Mild Aortic insufficiency    a. 04/2021 Echo: EF 50-55%, no rwma, mild LVH, nl RV fxn, mild BAE, mild MR/AI.   Mild Mitral regurgitation    Past Surgical History:  Procedure Laterality Date   ATRIAL TACH ABLATION N/A 04/25/2021   Procedure: ATRIAL TACH ABLATION;  Surgeon: Evans Lance, MD;  Location: [facility];  Service: Cardiovascular;  Laterality: N/A;   CARDIOVERSION N/A 04/27/2021   Procedure: CARDIOVERSION;  Surgeon: Sanda Klein, MD;  Location: [facility];  Service: Cardiovascular;  Laterality: N/A;   CESAREAN [place] GRAFT  5/07   Readmitted for post op pleural effusions  DOPPLER ECHOCARDIOGRAPHY     NV LV EF 1+ AI/MR   EXCISION METACARPAL MASS Left 02/21/2018   Procedure: EXCISION METACARPAL MASS;  Surgeon: Hessie Knows, MD;  Location: [facility];  Service: Orthopedics;  Laterality: Left;   Stress imaging  6/07   Negative EF 67%   TEE WITHOUT CARDIOVERSION N/A 04/27/2021   Procedure: TRANSESOPHAGEAL ECHOCARDIOGRAM (TEE);  Surgeon: Sanda Klein, MD;  [facility];  Service: Cardiovascular;  Laterality: N/A;   TONSILLECTOMY      Current Outpatient Medications  Medication Sig Dispense Refill   acetaminophen (TYLENOL) 650 MG CR tablet Take 1,300 mg by mouth 2 (two) times daily as needed for  pain.     apixaban (ELIQUIS) 5 MG TABS tablet Take 1 tablet (5 mg total) by mouth 2 (two) times daily. 180 tablet 1   Calcium Carb-Cholecalciferol (CALCIUM + VITAMIN D3 PO) Take 1 tablet by mouth daily.     dofetilide (TIKOSYN) 250 MCG capsule Take 1 capsule (250 mcg total) by mouth 2 (two) times daily. 180 capsule 1   ferrous sulfate (SLOW RELEASE IRON) 160 (50 Fe) MG TBCR SR tablet Take 160 mg by mouth every Monday, Wednesday, and Friday.     Glucosamine 500 MG CAPS Take 500 mg by mouth daily.     losartan (COZAAR) 50 MG tablet Take 1 tablet (50 mg total) by mouth daily. 90 tablet 3   Omega-3 Fatty Acids (FISH OIL PO) Take 1 capsule by mouth daily.     triamcinolone cream (KENALOG) 0.1 % Apply 1 Application topically 2 (two) times daily as needed. 45 g 1   Turmeric (QC TUMERIC COMPLEX PO) Take 1 capsule by mouth See admin instructions. Take 1 tablet daily when staying home and 1 tablet twice daily when traveling     No current facility-administered medications for this encounter.    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[place])    Employer: RETIRED    Comment: Retired  Tobacco Use   Smoking status: Former    Types: Cigarettes    Quit date: 06/26/1994    Years since quitting: 27.9    Passive exposure: Never   Smokeless tobacco: Never   Tobacco comments:    Former smoker 12/28/21  Vaping Use   Vaping Use: Never used  Substance and Sexual Activity   Alcohol use: Yes    Alcohol/week: 7.0 - 14.0 standard drinks of alcohol    Types: 7 - 14 Standard drinks or equivalent per week    Comment: 1-2 drinks  daily 12/28/21   Drug use: No   Sexual activity: Never  Other Topics Concern   Not on file  Social History Narrative   Has living will   Son [name], is health care POA   Would accept resuscitation attempts   No tube feeds if cognitively unaware   Social Determinants of Health   Financial Resource Strain: Not on file  Food Insecurity: Not on file  Transportation Needs: Not on file  Physical Activity: Not on  file  Stress: Not on file  Social Connections: Not on file  Intimate Partner Violence: Not on file     ROS- All systems are reviewed and negative except as per the HPI above.  Physical Exam: Vitals:   05/30/22 0936  BP: 136/76  Pulse: 64  Weight: 88.2 kg  Height: 5' 3.75" (1.619 m)     GEN- The patient is a well appearing elderly female, alert and oriented x 3 today.   HEENT-head normocephalic, atraumatic, sclera clear, conjunctiva pink, hearing intact, trachea midline. Lungs- Clear to ausculation bilaterally, normal work of breathing Heart- Regular rate and rhythm, no murmurs, rubs or gallops  GI- soft, NT, ND, + BS Extremities- no clubbing, cyanosis, or edema MS- no significant deformity or atrophy Skin- no rash or lesion Psych- euthymic mood, full affect Neuro- strength and sensation are intact   Wt Readings from Last 3 Encounters:  05/30/22 88.2 kg  04/24/22 85.7 kg  03/02/22 85.5 kg    EKG today demonstrates  SR, NST Vent. rate 64 BPM PR interval 162 ms QRS duration 82 ms QT/QTcB 412/425 ms  Echo 04/27/21 demonstrated   1. Left ventricular ejection fraction, by estimation, is 50 to 55%. The  left ventricle has low normal function. The left ventricle has no regional  wall motion abnormalities. There is mild left ventricular hypertrophy.  Indeterminate diastolic filling due to E-A fusion.   2. Right ventricular systolic function is mildly reduced. The right  ventricular size is normal. There is normal pulmonary artery systolic  pressure.    3. Left atrial size was mildly dilated.   4. Right atrial size was mildly dilated.   5. The mitral valve is normal in structure. Mild mitral valve  regurgitation.   6. The aortic valve is normal in structure. Aortic valve regurgitation is  mild. Mild aortic valve sclerosis is present, with no evidence of aortic  valve stenosis.   7. The inferior vena cava is normal in size with greater than 50%  respiratory variability, suggesting right atrial pressure of 3 mmHg.   Epic records are reviewed at length today  CHA2DS2-VASc Score = 5  The patient's score is based upon: CHF History: 0 HTN History: 1 Diabetes History: 0 Stroke History: 0 Vascular Disease History: 1 Age Score: 2 Gender Score: 1       ASSESSMENT AND PLAN: 1. Atypical atrial flutter The patient's CHA2DS2-VASc score is 5, indicating a 7.2% annual risk of stroke.   S/p dofetilide admission 7/10-7/13/23 Patient having brief episodes about once per week. We discussed alternate rhythm control options including ablation and amiodarone. She does not want to take amiodarone due to concern about off target effects. She is happy with her present treatment and does not wish to pursue ablation at this time.  Continue dofetilide 250 mcg BID. QT stable. Check bmet/mag today.  Continue Eliquis 5 mg BID  2. Secondary Hypercoagulable State (ICD10:  D68.69) The patient is at significant risk for stroke/thromboembolism based upon her CHA2DS2-VASc Score of 5.  Continue Apixaban (Eliquis).   3. Obesity Body mass index is 33.63 kg/m. Lifestyle modification was discussed and encouraged including regular physical activity and weight reduction.  4. CAD S/p CABG No anginal symptoms.  5. HTN Stable, no changes today.   Follow up with Dr Lovena Le as scheduled. AF clinic in 6 months.    [facility] [address] [place], [place] [address] [phone] 05/30/2022 9:51 AM

## 2022-08-18 ENCOUNTER — Encounter: Payer: Self-pay | Admitting: Internal Medicine

## 2022-08-18 DIAGNOSIS — I484 Atypical atrial flutter: Secondary | ICD-10-CM

## 2022-08-18 MED ORDER — APIXABAN 5 MG PO TABS: 5.0000 mg | ORAL_TABLET | Freq: Two times a day (BID) | ORAL | 1 refills | Status: DC

## 2022-08-18 NOTE — Telephone Encounter (Signed)
Prescription refill request for Eliquis received. Indication: Afib  Last office visit: 05/30/22 Denise Henson)  Scr: 1.02 (05/30/22)  Age: 77 Weight: 88.2kg   Appropriate dose. Refill sent.

## 2022-08-22 ENCOUNTER — Other Ambulatory Visit: Payer: Self-pay | Admitting: *Deleted

## 2022-08-22 DIAGNOSIS — I484 Atypical atrial flutter: Secondary | ICD-10-CM

## 2022-08-22 MED ORDER — APIXABAN 5 MG PO TABS: 5.0000 mg | ORAL_TABLET | Freq: Two times a day (BID) | ORAL | 1 refills | Status: DC

## 2022-08-22 NOTE — Telephone Encounter (Addendum)
Prescription refill request for Eliquis received.  Indication: afib  Last office visit: Denise Henson, 11/10/2021 Scr: 1.02, 05/30/2022 Age: 77 yo  Weight: 88.2 kg   Refill sent.

## 2022-08-28 ENCOUNTER — Ambulatory Visit: Payer: Medicare HMO | Admitting: Internal Medicine

## 2022-08-29 ENCOUNTER — Ambulatory Visit (INDEPENDENT_AMBULATORY_CARE_PROVIDER_SITE_OTHER): Payer: Medicare Other

## 2022-08-29 ENCOUNTER — Encounter: Payer: Self-pay | Admitting: Internal Medicine

## 2022-08-29 ENCOUNTER — Ambulatory Visit: Payer: Medicare Other | Attending: Internal Medicine | Admitting: Internal Medicine

## 2022-08-29 VITALS — BP 162/82 | HR 69 | Ht 64.0 in | Wt 194.4 lb

## 2022-08-29 DIAGNOSIS — I484 Atypical atrial flutter: Secondary | ICD-10-CM | POA: Diagnosis not present

## 2022-08-29 DIAGNOSIS — I10 Essential (primary) hypertension: Secondary | ICD-10-CM

## 2022-08-29 NOTE — Patient Instructions (Addendum)
Medication Instructions:  Your physician recommends that you continue on your current medications as directed. Please refer to the Current Medication list given to you today.  *If you need a refill on your cardiac medications before your next appointment, please call your pharmacy*  Lab Work: You will have lab work drawn today: CBC, BMET, and Magnesium level.     Testing/Procedures: Dr. Cristopher Peru has ordered a 7 day Zio Heart Monitor.   See instructions / education below.    Follow-Up: We will follow up with you, once the Labs, and Zio Heart Monitor results are received; Per Dr. Cristopher Peru.  The format for your next appointment:   In Person  Provider:   Cristopher Peru, MD{or one of the following Advanced Practice Providers on your designated Care Team:   Tommye Standard, [place] Legrand Como "Jonni Sanger" Tillery, PA-C  [place] Monitor Instructions  Your physician has requested you wear a ZIO patch monitor for 14 days.  This is a single patch monitor. [employer] supplies one patch monitor per enrollment. Additional stickers are not available. Please do not apply patch if you will be having a Nuclear Stress Test,  Echocardiogram, Cardiac CT, MRI, or Chest Xray during the period you would be wearing the  monitor. The patch cannot be worn during these tests. You cannot remove and re-apply the  ZIO XT patch monitor.  Your ZIO patch monitor will be mailed 3 day USPS to your address on file. It may take 3-5 days  to receive your monitor after you have been enrolled.  Once you have received your monitor, please review the enclosed instructions. Your monitor  has already been registered assigning a specific monitor serial # to you.  Billing and Patient Assistance Program Information  We have supplied [employer] with any of your insurance information on file for billing purposes. [employer] offers a sliding scale Patient Assistance Program for patients that do not have  insurance, or whose insurance  does not completely cover the cost of the ZIO monitor.  You must apply for the Patient Assistance Program to qualify for this discounted rate.  To apply, please call [employer] at [phone], select option 4, select option 2, ask to apply for  Patient Assistance Program. Theodore Demark will ask your household income, and how many people  are in your household. They will quote your out-of-pocket cost based on that information.  [employer] will also be able to set up a 29-month interest-free payment plan if needed.  Applying the monitor   Shave hair from upper left chest.  Hold abrader disc by orange tab. Rub abrader in 40 strokes over the upper left chest as  indicated in your monitor instructions.  Clean area with 4 enclosed alcohol pads. Let dry.  Apply patch as indicated in monitor instructions. Patch will be placed under collarbone on left  side of chest with arrow pointing upward.  Rub patch adhesive wings for 2 minutes. Remove white label marked "1". Remove the white  label marked "2". Rub patch adhesive wings for 2 additional minutes.  While looking in a mirror, press and release button in center of patch. A small green light will  flash 3-4 times. This will be your only indicator that the monitor has been turned on.  Do not shower for the first 24 hours. You may shower after the first 24 hours.  Press the button if you feel a symptom. You will hear a small click. Record Date, Time and  Symptom in the Patient  Logbook.  When you are ready to remove the patch, follow instructions on the last 2 pages of Patient  Logbook. Stick patch monitor onto the last page of Patient Logbook.  Place Patient Logbook in the blue and white box. Use locking tab on box and tape box closed  securely. The blue and white box has prepaid postage on it. Please place it in the mailbox as  soon as possible. Your physician should have your test results approximately 7 days after the  monitor has been mailed back to  [facility].  Call [place] at [phone] if you have questions regarding  your ZIO XT patch monitor. Call them immediately if you see an orange light blinking on your  monitor.  If your monitor falls off in less than 4 days, contact our Monitor department at [phone].  If your monitor becomes loose or falls off after 4 days call [employer] at [phone] for  suggestions on securing your monitor

## 2022-08-29 NOTE — Progress Notes (Signed)
HPI Mrs. Gahm returns today for followup of her left atrial flutter. She is a pleasant 77 yo woman with LA flutter discovered at EP study with a h/o HTN. She was placed on anti-coagulation. She had recurrent atrial flutter after her EPS/DCCV and was placed on amiodarone. She has taken 3 doses total, deciding that she did not really want to take after reading the package insert. She has not had chest pain, sob or edema. Rare palpitations. She was then placed on dofetilide. She is better. She still has some breakthru arrhythmias but overall fairly well controlled. She checks her bp at home and it is usually well controlled. She just got back from a trip to the [place], and thinks that she got avian flu after visiting a penguin colony. She is finally feeling better. She notes that she thinks her break throughs of atrial fib have worsened.  Allergies  Allergen Reactions   Codeine Anxiety    Jittery    Morphine Anxiety    jittery   Other Anxiety    Opiates - Jittery, Anxiety   Zestril [Lisinopril] Cough     Current Outpatient Medications  Medication Sig Dispense Refill   acetaminophen (TYLENOL) 650 MG CR tablet Take 1,300 mg by mouth 2 (two) times daily as needed for pain.     apixaban (ELIQUIS) 5 MG TABS tablet Take 1 tablet (5 mg total) by mouth 2 (two) times daily. 180 tablet 1   Calcium Carb-Cholecalciferol (CALCIUM + VITAMIN D3 PO) Take 1 tablet by mouth daily.     dofetilide (TIKOSYN) 250 MCG capsule Take 1 capsule (250 mcg total) by mouth 2 (two) times daily. 180 capsule 1   ferrous sulfate (SLOW RELEASE IRON) 160 (50 Fe) MG TBCR SR tablet Take 160 mg by mouth every Monday, Wednesday, and Friday.     Glucosamine 500 MG CAPS Take 500 mg by mouth daily.     losartan (COZAAR) 50 MG tablet Take 1 tablet (50 mg total) by mouth daily. 90 tablet 3   Omega-3 Fatty Acids (FISH OIL PO) Take 1 capsule by mouth daily.     triamcinolone cream (KENALOG) 0.1 % Apply 1 Application  topically 2 (two) times daily as needed. 45 g 1   Turmeric (QC TUMERIC COMPLEX PO) Take 1 capsule by mouth See admin instructions. Take 1 tablet daily when staying home and 1 tablet twice daily when traveling     No current facility-administered medications for this visit.     Past Medical History:  Diagnosis Date   Allergic rhinitis due to pollen    Allergy    Anemia    Arthritis    Asthma    as a child   Atypical atrial flutter ([place])    a. 03/2021 EPS: L sided Aflutter; b. 04/2021 s/p DCCV; c. CHA2DS2VASc = 5-->eliquis. Has been using amio prn.   Coronary artery disease    a. 2007 CAB[facility] x 1: [facility] [place] b. 12/2017 MV: Neg.   GERD (gastroesophageal reflux disease)    Hx of pleural effusion 2007   after CABG   Hyperlipidemia    Unable to tolerate simvastatin 40 due to myalgias   Hypertension    Mild Aortic insufficiency    a. 04/2021 Echo: EF 50-55%, no rwma, mild LVH, nl RV fxn, mild BAE, mild MR/AI.   Mild Mitral regurgitation     ROS:   All systems reviewed and negative except as noted in the HPI.  Past Surgical History:  Procedure Laterality Date   ATRIAL TACH ABLATION N/A 04/25/2021   Procedure: ATRIAL TACH ABLATION;  Surgeon: Evans Lance, MD;  Location: [facility];  Service: Cardiovascular;  Laterality: N/A;   CARDIOVERSION N/A 04/27/2021   Procedure: CARDIOVERSION;  Surgeon: Sanda Klein, MD;  Location: [facility];  Service: Cardiovascular;  Laterality: N/A;   CESAREAN SECTION     CHOLECYSTECTOMY     COLONOSCOPY     CORONARY ARTERY BYPASS GRAFT  5/07   Readmitted for post op pleural effusions   DOPPLER ECHOCARDIOGRAPHY     NV LV EF 1+ AI/MR   EXCISION METACARPAL MASS Left 02/21/2018   Procedure: EXCISION METACARPAL MASS;  Surgeon: Hessie Knows, MD;  Location: [facility];  Service: Orthopedics;  Laterality: Left;   Stress imaging  6/07   Negative EF 67%   TEE WITHOUT CARDIOVERSION N/A 04/27/2021   Procedure:  TRANSESOPHAGEAL ECHOCARDIOGRAM (TEE);  Surgeon: Sanda Klein, MD;  [facility];  Service: Cardiovascular;  Laterality: N/A;   TONSILLECTOMY       Family History  Problem Relation Age of Onset   Heart attack Mother 20   Heart attack Maternal Grandmother    Cancer Neg Hx        Breast or colon   Diabetes Neg Hx    Hypertension Neg Hx    Colon cancer Neg Hx    Colon polyps Neg Hx    Esophageal cancer Neg Hx    Rectal cancer Neg Hx    Stomach cancer Neg Hx      Social History   Socioeconomic History   Marital status: Widowed    Spouse name: Not on file   Number of children: 1   Years of education: Not on file   Highest education level: Not on file  Occupational History   Occupation: Pharmacist, 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[place])    Employer: RETIRED    Comment: Retired  Tobacco Use   Smoking status: Former    Types: Cigarettes    Quit date: 06/26/1994    Years since quitting: 28.1    Passive exposure: Never   Smokeless tobacco: Never   Tobacco comments:    Former smoker 12/28/21  Vaping Use   Vaping Use: Never used  Substance and Sexual Activity   Alcohol use: Yes    Alcohol/week: 7.0 - 14.0 standard drinks of alcohol    Types: 7 - 14 Standard drinks or equivalent per week    Comment: 1-2 drinks daily 12/28/21   Drug use: No   Sexual activity: Never  Other Topics Concern   Not on file  Social History Narrative   Has living will   Son [name], is health care POA   Would accept resuscitation attempts   No tube feeds if cognitively unaware   Social Determinants of Health   Financial Resource Strain: Not on file  Food Insecurity: Not on file  Transportation Needs: Not on file  Physical Activity: Not on file  Stress: Not on file  Social Connections: Not on file  Intimate Partner Violence: Not on file     BP (!) 162/82   Pulse 69   Ht '5\' 4"'$  (1.626 m)   Wt 194 lb 6.4 oz (88.2 kg)   SpO2 98%   BMI 33.37 kg/m   Physical Exam:  Well appearing NAD HEENT:  Unremarkable Neck:  No JVD, no thyromegally Lymphatics:  No adenopathy Back:  No CVA tenderness Lungs:  Clear with no wheezes HEART:  Regular rate rhythm, no murmurs, no rubs, no clicks Abd:  soft, positive bowel sounds, no organomegally, no rebound, no guarding Ext:  2 plus pulses, no edema, no cyanosis, no clubbing Skin:  No rashes no nodules Neuro:  CN II through XII intact, motor grossly intact  EKG - nsr withLVH   Assess/Plan: PAF - she appears to be having some break through of her arrhythmia and I have asked her to obtain a 7 day zio monitor. We will also obtain labs to look at her electrolytes Possible avian flu - we will obtain a CBC though she is improved. She is on systemic anti-coagulation.  Carleene Overlie Siddharth Babington,MD

## 2022-08-29 NOTE — Progress Notes (Unsigned)
Enrolled patient for a 7 day Zio XT monitor to be mailed to patients home.  

## 2022-08-30 LAB — BASIC METABOLIC PANEL
BUN/Creatinine Ratio: 15 (ref 12–28)
BUN: 14 mg/dL (ref 8–27)
CO2: 24 mmol/L (ref 20–29)
Calcium: 9.8 mg/dL (ref 8.7–10.3)
Chloride: 99 mmol/L (ref 96–106)
Creatinine, Ser: 0.94 mg/dL (ref 0.57–1.00)
Glucose: 101 mg/dL — ABNORMAL HIGH (ref 70–99)
Potassium: 4.4 mmol/L (ref 3.5–5.2)
Sodium: 138 mmol/L (ref 134–144)
eGFR: 62 mL/min/{1.73_m2} (ref >59)

## 2022-08-30 LAB — CBC WITH DIFFERENTIAL/PLATELET
Basophils Absolute: 0.1 10*3/uL (ref 0.0–0.2)
Basos: 1 %
EOS (ABSOLUTE): 0.2 10*3/uL (ref 0.0–0.4)
Eos: 3 %
Hematocrit: 29.9 % — ABNORMAL LOW (ref 34.0–46.6)
Hemoglobin: 9.1 g/dL — ABNORMAL LOW (ref 11.1–15.9)
Immature Grans (Abs): 0 10*3/uL (ref 0.0–0.1)
Immature Granulocytes: 0 %
Lymphocytes Absolute: 1 10*3/uL (ref 0.7–3.1)
Lymphs: 13 %
MCH: 25.7 pg — ABNORMAL LOW (ref 26.6–33.0)
MCHC: 30.4 g/dL — ABNORMAL LOW (ref 31.5–35.7)
MCV: 85 fL (ref 79–97)
Monocytes Absolute: 0.6 10*3/uL (ref 0.1–0.9)
Monocytes: 8 %
Neutrophils Absolute: 5.8 10*3/uL (ref 1.4–7.0)
Neutrophils: 75 %
Platelets: 338 10*3/uL (ref 150–450)
RBC: 3.54 x10E6/uL — ABNORMAL LOW (ref 3.77–5.28)
RDW: 15.8 % — ABNORMAL HIGH (ref 11.7–15.4)
WBC: 7.6 10*3/uL (ref 3.4–10.8)

## 2022-08-30 LAB — MAGNESIUM: Magnesium: 1.9 mg/dL (ref 1.6–2.3)

## 2022-08-31 ENCOUNTER — Encounter: Payer: Self-pay | Admitting: Internal Medicine

## 2022-08-31 ENCOUNTER — Telehealth: Payer: Self-pay

## 2022-08-31 DIAGNOSIS — D500 Iron deficiency anemia secondary to blood loss (chronic): Secondary | ICD-10-CM

## 2022-08-31 DIAGNOSIS — I484 Atypical atrial flutter: Secondary | ICD-10-CM | POA: Diagnosis not present

## 2022-08-31 DIAGNOSIS — I10 Essential (primary) hypertension: Secondary | ICD-10-CM | POA: Diagnosis not present

## 2022-08-31 NOTE — Telephone Encounter (Signed)
-----   Message from Evans Lance, MD sent at 08/30/2022  4:53 PM EST ----- Her hemoglobin is down and she needs to be referred back to Dr. Silvio Pate to work up. GT

## 2022-08-31 NOTE — Telephone Encounter (Signed)
Message received from Dr. Lovena Le regarding low HGB of 9.1 on 08/29/2022 chemistry panel lab draw.  Pt contacted, states she has long PMH of near anemia, and takes Iron as needed when labs are low.  Pt only symptom was intermittent fatigue throughout day.    Pt encouraged to reach out to Dr. Silvio Pate, and make him / RN aware of current HGB value.  I will send a message through Epic to Dr. Silvio Pate as well.    Pt appreciated the call, and said she would reach out to [facility] with any future concerns she may have.

## 2022-09-01 NOTE — Telephone Encounter (Signed)
I have 2 messages on her: this one and a MyChart message. The MyChart message is advising me to send her a iFob kit. I need clarification on this message. Does she need a lab visit or an OV?

## 2022-09-04 NOTE — Telephone Encounter (Signed)
Pt is coming in for an [place] on 09-06-22.

## 2022-09-04 NOTE — Telephone Encounter (Signed)
Spoke to pt. Made OV 09-06-22.

## 2022-09-06 ENCOUNTER — Encounter: Payer: Self-pay | Admitting: Internal Medicine

## 2022-09-06 ENCOUNTER — Ambulatory Visit (INDEPENDENT_AMBULATORY_CARE_PROVIDER_SITE_OTHER): Payer: Medicare Other | Admitting: Internal Medicine

## 2022-09-06 VITALS — BP 138/72 | HR 72 | Temp 97.5°F | Ht 64.0 in | Wt 194.0 lb

## 2022-09-06 DIAGNOSIS — D649 Anemia, unspecified: Secondary | ICD-10-CM | POA: Diagnosis not present

## 2022-09-06 LAB — IBC + FERRITIN
Ferritin: 13.6 ng/mL (ref 10.0–291.0)
Iron: 75 ug/dL (ref 42–145)
Saturation Ratios: 14.5 % — ABNORMAL LOW (ref 20.0–50.0)
TIBC: 518 ug/dL — ABNORMAL HIGH (ref 250.0–450.0)
Transferrin: 370 mg/dL — ABNORMAL HIGH (ref 212.0–360.0)

## 2022-09-06 LAB — CBC
HCT: 30.4 % — ABNORMAL LOW (ref 36.0–46.0)
Hemoglobin: 9.5 g/dL — ABNORMAL LOW (ref 12.0–15.0)
MCHC: 31.4 g/dL (ref 30.0–36.0)
MCV: 86.4 fl (ref 78.0–100.0)
Platelets: 297 10*3/uL (ref 150.0–400.0)
RBC: 3.52 Mil/uL — ABNORMAL LOW (ref 3.87–5.11)
RDW: 20 % — ABNORMAL HIGH (ref 11.5–15.5)
WBC: 4.8 10*3/uL (ref 4.0–10.5)

## 2022-09-06 LAB — VITAMIN B12: Vitamin B-12: 329 pg/mL (ref 211–911)

## 2022-09-06 NOTE — Assessment & Plan Note (Signed)
Presumably iron deficiency again She thinks it could be dietary Has started iron---will check labs FIT---colonoscopy if positive Hematology if not iron deficiency

## 2022-09-06 NOTE — Progress Notes (Signed)
Subjective:    Patient ID: Denise Henson, female    DOB: 08-03-1945, 77 y.o.   MRN: [id number]  HPI Here to review the recurrence of anemia  Just back 3 weeks ago from [place] Went to see cardiology and had blood work Found to have the anemia  Did have iron deficiency a few years ago Felt it was dietary--due to husband's deterioration Better with iron She had cut back on beef--but has restarted Is back on daily iron--and started B12  Now on tikosyn for atrial flutter That makes her dizzy and SOB Usually ~114 with spells Feels her trigger is dehydration No symptomatic spell in a month--but has felt it irregular (?atrial fib) Has zio monitor now  Current Outpatient Medications on File Prior to Visit  Medication Sig Dispense Refill   acetaminophen (TYLENOL) 650 MG CR tablet Take 1,300 mg by mouth 2 (two) times daily as needed for pain.     apixaban (ELIQUIS) 5 MG TABS tablet Take 1 tablet (5 mg total) by mouth 2 (two) times daily. 180 tablet 1   Calcium Carb-Cholecalciferol (CALCIUM + VITAMIN D3 PO) Take 1 tablet by mouth daily.     cyanocobalamin (VITAMIN B12) 1000 MCG tablet Take 1,000 mcg by mouth daily.     dofetilide (TIKOSYN) 250 MCG capsule Take 1 capsule (250 mcg total) by mouth 2 (two) times daily. 180 capsule 1   ferrous sulfate (SLOW RELEASE IRON) 160 (50 Fe) MG TBCR SR tablet Take 160 mg by mouth daily.     Glucosamine 500 MG CAPS Take 500 mg by mouth daily.     losartan (COZAAR) 50 MG tablet Take 1 tablet (50 mg total) by mouth daily. 90 tablet 3   Omega-3 Fatty Acids (FISH OIL PO) Take 1 capsule by mouth daily.     triamcinolone cream (KENALOG) 0.1 % Apply 1 Application topically 2 (two) times daily as needed. 45 g 1   Turmeric (QC TUMERIC COMPLEX PO) Take 1 capsule by mouth See admin instructions. Take 1 tablet daily when staying home and 1 tablet twice daily when traveling     No current facility-administered medications on file prior to visit.     Allergies  Allergen Reactions   Codeine Anxiety    Jittery    Morphine Anxiety    jittery   Other Anxiety    Opiates - Jittery, Anxiety   Zestril [Lisinopril] Cough    Past Medical History:  Diagnosis Date   Allergic rhinitis due to pollen    Allergy    Anemia    Arthritis    Asthma    as a child   Atypical atrial flutter ([place])    a. 03/2021 EPS: L sided Aflutter; b. 04/2021 s/p DCCV; c. CHA2DS2VASc = 5-->eliquis. Has been using amio prn.   Coronary artery disease    a. 2007 CABG x 1: [facility] [place] b. 12/2017 MV: Neg.   GERD (gastroesophageal reflux disease)    Hx of pleural effusion 2007   after CABG   Hyperlipidemia    Unable to tolerate simvastatin 40 due to myalgias   Hypertension    Mild Aortic insufficiency    a. 04/2021 Echo: EF 50-55%, no rwma, mild LVH, nl RV fxn, mild BAE, mild MR/AI.   Mild Mitral regurgitation     Past Surgical History:  Procedure Laterality Date   ATRIAL TACH ABLATION N/A 04/25/2021   Procedure: ATRIAL TACH ABLATION;  Surgeon: Evans Lance, MD;  Location: [facility];  Service: Cardiovascular;  Laterality: N/A;   CARDIOVERSION N/A 04/27/2021   Procedure: CARDIOVERSION;  Surgeon: Sanda Klein, MD;  Location: [facility];  Service: Cardiovascular;  Laterality: N/A;   CESAREAN SECTION     CHOLECYSTECTOMY     COLONOSCOPY     CORONARY ARTERY BYPASS GRAFT  5/07   Readmitted for post op pleural effusions   DOPPLER ECHOCARDIOGRAPHY     NV LV EF 1+ AI/MR   EXCISION METACARPAL MASS Left 02/21/2018   Procedure: EXCISION METACARPAL MASS;  Surgeon: Hessie Knows, MD;  Location: [facility];  Service: Orthopedics;  Laterality: Left;   Stress imaging  6/07   Negative EF 67%   TEE WITHOUT CARDIOVERSION N/A 04/27/2021   Procedure: TRANSESOPHAGEAL ECHOCARDIOGRAM (TEE);  Surgeon: Sanda Klein, MD;  [facility];  Service: Cardiovascular;  Laterality: N/A;   TONSILLECTOMY      Family History   Problem Relation Age of Onset   Heart attack Mother 28   Heart attack Maternal Grandmother    Cancer Neg Hx        Breast or colon   Diabetes Neg Hx    Hypertension Neg Hx    Colon cancer Neg Hx    Colon polyps Neg Hx    Esophageal cancer Neg Hx    Rectal cancer Neg Hx    Stomach cancer Neg Hx     Social History   Socioeconomic History   Marital status: Widowed    Spouse name: Not on file   Number of children: 1   Years of education: Not on file   Highest education level: Not on file  Occupational History   Occupation: Pharmacist, 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[employer])    Employer: RETIRED    Comment: Retired  Tobacco Use   Smoking status: Former    Types: Cigarettes    Quit date: 06/26/1994    Years since quitting: 28.2    Passive exposure: Never   Smokeless tobacco: Never   Tobacco comments:    Former smoker 12/28/21  Vaping Use   Vaping Use: Never used  Substance and Sexual Activity   Alcohol use: Yes    Alcohol/week: 7.0 - 14.0 standard drinks of alcohol    Types: 7 - 14 Standard drinks or equivalent per week    Comment: 1-2 drinks daily 12/28/21   Drug use: No   Sexual activity: Never  Other Topics Concern   Not on file  Social History Narrative   Has living will   Son [name], is health care POA   Would accept resuscitation attempts   No tube feeds if cognitively unaware   Social Determinants of Health   Financial Resource Strain: Not on file  Food Insecurity: Not on file  Transportation Needs: Not on file  Physical Activity: Not on file  Stress: Not on file  Social Connections: Not on file  Intimate Partner Violence: Not on file   Review of Systems Sleep is variable---affected by nocturia Mood has been good     Objective:   Physical Exam Constitutional:      Appearance: Normal appearance.  Cardiovascular:     Rate and Rhythm: Normal rate and regular rhythm.     Heart sounds:     No gallop.  Neurological:     Mental Status: She is alert.  Psychiatric:         Mood and Affect: Mood normal.        Behavior: Behavior normal.  Assessment & Plan:

## 2022-09-06 NOTE — Addendum Note (Signed)
Addended by: Ellamae Sia on: 09/06/2022 10:06 AM   Modules accepted: Orders

## 2022-09-07 ENCOUNTER — Other Ambulatory Visit: Payer: Self-pay | Admitting: Internal Medicine

## 2022-09-07 DIAGNOSIS — D508 Other iron deficiency anemias: Secondary | ICD-10-CM

## 2022-09-08 ENCOUNTER — Telehealth: Payer: Self-pay

## 2022-09-08 ENCOUNTER — Other Ambulatory Visit (INDEPENDENT_AMBULATORY_CARE_PROVIDER_SITE_OTHER): Payer: Medicare Other | Admitting: Radiology

## 2022-09-08 DIAGNOSIS — D649 Anemia, unspecified: Secondary | ICD-10-CM | POA: Diagnosis not present

## 2022-09-08 LAB — FECAL OCCULT BLOOD, IMMUNOCHEMICAL: Fecal Occult Bld: POSITIVE — AB

## 2022-09-08 NOTE — Telephone Encounter (Signed)
Thank you. Dr Silvio Pate will advise next steps.

## 2022-09-08 NOTE — Telephone Encounter (Signed)
Holly with [facility] called + ifob. Report is in chart.Earnest Bailey said not critical and can wait until 09/11/22. Sending note to Dr Silvio Pate who is out of office and [place] pool. And I spoke with [facility] CMA.

## 2022-09-09 NOTE — Telephone Encounter (Signed)
Results released Will figure out where she wants to go for GI evaluation

## 2022-09-11 NOTE — Addendum Note (Signed)
Addended by: Viviana Simpler I on: 09/11/2022 01:01 PM   Modules accepted: Orders

## 2022-09-11 NOTE — Progress Notes (Signed)
Referral made 

## 2022-09-14 DIAGNOSIS — I10 Essential (primary) hypertension: Secondary | ICD-10-CM | POA: Diagnosis not present

## 2022-09-14 DIAGNOSIS — I484 Atypical atrial flutter: Secondary | ICD-10-CM | POA: Diagnosis not present

## 2022-09-18 ENCOUNTER — Encounter: Payer: Self-pay | Admitting: Internal Medicine

## 2022-09-28 ENCOUNTER — Telehealth: Payer: Self-pay

## 2022-09-28 NOTE — Telephone Encounter (Signed)
-----   Message from Evans Lance, MD sent at 09/20/2022  3:18 PM EDT ----- No atrial fib. The PVC's and PAC's are improved. No change in treatment.

## 2022-10-01 ENCOUNTER — Other Ambulatory Visit: Payer: Self-pay | Admitting: Physician Assistant

## 2022-10-23 ENCOUNTER — Other Ambulatory Visit (INDEPENDENT_AMBULATORY_CARE_PROVIDER_SITE_OTHER): Payer: Medicare Other

## 2022-10-23 DIAGNOSIS — D508 Other iron deficiency anemias: Secondary | ICD-10-CM

## 2022-10-23 LAB — CBC WITH DIFFERENTIAL/PLATELET
Basophils Absolute: 0 10*3/uL (ref 0.0–0.1)
Basophils Relative: 0.8 % (ref 0.0–3.0)
Eosinophils Absolute: 0.3 10*3/uL (ref 0.0–0.7)
Eosinophils Relative: 5.3 % — ABNORMAL HIGH (ref 0.0–5.0)
HCT: 33.3 % — ABNORMAL LOW (ref 36.0–46.0)
Hemoglobin: 10.8 g/dL — ABNORMAL LOW (ref 12.0–15.0)
Lymphocytes Relative: 26 % (ref 12.0–46.0)
Lymphs Abs: 1.4 10*3/uL (ref 0.7–4.0)
MCHC: 32.5 g/dL (ref 30.0–36.0)
MCV: 96.6 fl (ref 78.0–100.0)
Monocytes Absolute: 0.5 10*3/uL (ref 0.1–1.0)
Monocytes Relative: 9.4 % (ref 3.0–12.0)
Neutro Abs: 3.2 10*3/uL (ref 1.4–7.7)
Neutrophils Relative %: 58.5 % (ref 43.0–77.0)
Platelets: 237 10*3/uL (ref 150.0–400.0)
RBC: 3.45 Mil/uL — ABNORMAL LOW (ref 3.87–5.11)
RDW: 24.2 % — ABNORMAL HIGH (ref 11.5–15.5)
WBC: 5.5 10*3/uL (ref 4.0–10.5)

## 2022-11-01 ENCOUNTER — Encounter: Payer: Self-pay | Admitting: Gastroenterology

## 2022-11-01 ENCOUNTER — Telehealth (INDEPENDENT_AMBULATORY_CARE_PROVIDER_SITE_OTHER): Payer: Medicare Other | Admitting: Gastroenterology

## 2022-11-01 ENCOUNTER — Telehealth: Payer: Self-pay

## 2022-11-01 VITALS — Ht 64.0 in | Wt 194.0 lb

## 2022-11-01 DIAGNOSIS — D500 Iron deficiency anemia secondary to blood loss (chronic): Secondary | ICD-10-CM

## 2022-11-01 NOTE — Progress Notes (Signed)
Midge Minium, MD [address]  [address]  [place], [place] [address]  Main: [phone]  Fax: [phone]    Gastroenterology Virtual/Video Visit  Referring Provider:     Karie Schwalbe, MD Primary Care Physician:  Karie Schwalbe, MD Primary Gastroenterologist:  Dr.Varnell Donate Servando Snare Reason for Consultation:     Iron deficiency anemia        HPI:    Virtual Visit via Video Note Location of the patient: Home Location of provider: Home Office Participating persons: The patient and myself.  I connected with Denise Henson on 11/01/22 at  9:15 AM EDT by a video enabled telemedicine application and verified that I am speaking with the correct person using two identifiers.   I discussed the limitations of evaluation and management by telemedicine and the availability of in person appointments. The patient expressed understanding and agreed to proceed.  Verbal consent to proceed obtained.  History of Present Illness: Denise Henson is a 77 y.o. female referred by Dr. Karie Schwalbe, MD  for consultation & management of iron deficiency anemia.  This patient comes in after having history of iron deficiency anemia that responded to iron in the past and was attributed to the patient having a poor diet.  The patient had also cut back on red meat and was found to have anemia.  The patient's hemoglobin over the last year has shown:  Component     Latest Ref Rng 10/27/2021 08/29/2022 09/06/2022 10/23/2022  Hemoglobin     12.0 - 15.0 g/dL 96.2  9.1 (L)  9.5 (L)  10.8 (L)   HCT     36.0 - 46.0 % 41.8  29.9 (L)  30.4 (L)  33.3 (L)    The patient's iron studies were also checked and showed:  Component     Latest Ref Rng 09/06/2022  Iron     42 - 145 ug/dL 75   TIBC     952.8 - 413.2 mcg/dL 440.1 (H)   %SAT     16 - 45 % (calc)   Ferritin     10.0 - 291.0 ng/mL 13.6   Transferrin     212.0 - 360.0 mg/dL 027.2 (H)   Saturation Ratios     20.0 - 50.0 % 14.5 (L)    In addition  to this the patient had Hemoccult positive stools.  Her B12 was checked and was normal. The patient reports that she has been told that she has iron deficiency since she is young girl.  She denies any family history of colon cancer or colon polyps.  The patient also reports that she is going on vacation in and is not ready to undergo any procedures at this time.  The patient would like to continue her iron and finish up with her medications.  She has no unexplained weight loss and states that her black stools are only when she takes the iron.  The patient's last colonoscopy was 20 years ago.  Past Medical History:  Diagnosis Date   Allergic rhinitis due to pollen    Allergy    Anemia    Arthritis    Asthma    as a child   Atypical atrial flutter ([facility])    a. 03/2021 EPS: L sided Aflutter; b. 04/2021 s/p DCCV; c. CHA2DS2VASc = 5-->eliquis. Has been using amio prn.   Coronary artery disease    a. 2007 [facility] [place] b. 12/2017 MV: Neg.  GERD (gastroesophageal reflux disease)    Hx of pleural effusion 2007   after CABG   Hyperlipidemia    Unable to tolerate simvastatin 40 due to myalgias   Hypertension    Mild Aortic insufficiency    a. 04/2021 Echo: EF 50-55%, no rwma, mild LVH, nl RV fxn, mild BAE, mild MR/AI.   Mild Mitral regurgitation     Past Surgical History:  Procedure Laterality Date   ATRIAL TACH ABLATION N/A 04/25/2021   Procedure: ATRIAL TACH ABLATION;  Surgeon: Marinus Maw, MD;  [facility];  Service: Cardiovascular;  Laterality: N/A;   CARDIOVERSION N/A 04/27/2021   Procedure: CARDIOVERSION;  Surgeon: Thurmon Fair, MD;  [facility];  Service: Cardiovascular;  Laterality: N/A;   CESAREAN SECTION     CHOLECYSTECTOMY     COLONOSCOPY     CORONARY ARTERY BYPASS GRAFT  5/07   Readmitted for post op pleural effusions   DOPPLER ECHOCARDIOGRAPHY     NV LV EF 1+ AI/MR   EXCISION METACARPAL MASS Left 02/21/2018    Procedure: EXCISION METACARPAL MASS;  Surgeon: Kennedy Bucker, MD;  Location: [facility];  Service: Orthopedics;  Laterality: Left;   Stress imaging  6/07   Negative EF 67%   TEE WITHOUT CARDIOVERSION N/A 04/27/2021   Procedure: TRANSESOPHAGEAL ECHOCARDIOGRAM (TEE);  Surgeon: Thurmon Fair, MD;  Location: [facility];  Service: Cardiovascular;  Laterality: N/A;   TONSILLECTOMY      Prior to Admission medications   Medication Sig Start Date End Date Taking? Authorizing Provider  acetaminophen (TYLENOL) 650 MG CR tablet Take 1,300 mg by mouth 2 (two) times daily as needed for pain.    [provider]  apixaban (ELIQUIS) 5 MG TABS tablet Take 1 tablet (5 mg total) by mouth 2 (two) times daily. 08/22/22   Marinus Maw, MD  Calcium Carb-Cholecalciferol (CALCIUM + VITAMIN D3 PO) Take 1 tablet by mouth daily.    [provider]  cyanocobalamin (VITAMIN B12) 1000 MCG tablet Take 1,000 mcg by mouth daily.    [provider]  dofetilide (TIKOSYN) 250 MCG capsule TAKE ONE CAPSULE BY MOUTH TWICE A DAY 10/02/22   Fenton, Clint R, PA  ferrous sulfate (SLOW RELEASE IRON) 160 (50 Fe) MG TBCR SR tablet Take 160 mg by mouth daily.    [provider]  Glucosamine 500 MG CAPS Take 500 mg by mouth daily.    [provider]  losartan (COZAAR) 50 MG tablet Take 1 tablet (50 mg total) by mouth daily. 10/20/21   Karie Schwalbe, MD  Omega-3 Fatty Acids (FISH OIL PO) Take 1 capsule by mouth daily.    [provider]  triamcinolone cream (KENALOG) 0.1 % Apply 1 Application topically 2 (two) times daily as needed. 04/24/22   Karie Schwalbe, MD  Turmeric (QC TUMERIC COMPLEX PO) Take 1 capsule by mouth See admin instructions. Take 1 tablet daily when staying home and 1 tablet twice daily when traveling    [provider]    Family History  Problem Relation Age of Onset   Heart attack Mother 43   Heart attack Maternal Grandmother    Cancer Neg Hx         Breast or colon   Diabetes Neg Hx    Hypertension Neg Hx    Colon cancer Neg Hx    Colon polyps Neg Hx    Esophageal cancer Neg Hx    Rectal cancer Neg Hx    Stomach  cancer Neg Hx      Social History   Tobacco Use   Smoking status: Former    Types: Cigarettes    Quit date: 06/26/1994    Years since quitting: 28.3    Passive exposure: Never   Smokeless tobacco: Never   Tobacco comments:    Former smoker 12/28/21  Vaping Use   Vaping Use: Never used  Substance Use Topics   Alcohol use: Yes    Alcohol/week: 7.0 - 14.0 standard drinks of alcohol    Types: 7 - 14 Standard drinks or equivalent per week    Comment: 1-2 drinks daily 12/28/21   Drug use: No    Allergies as of 11/01/2022 - Review Complete 09/06/2022  Allergen Reaction Noted   Codeine Anxiety    Morphine Anxiety 09/28/2006   Other Anxiety 01/02/2022   Zestril [lisinopril] Cough     Review of Systems:    All systems reviewed and negative except where noted in HPI.   Observations/Objective:  Labs: CBC    Component Value Date/Time   WBC 5.5 10/23/2022 0737   RBC 3.45 (L) 10/23/2022 0737   HGB 10.8 (L) 10/23/2022 0737   HGB 9.1 (L) 08/29/2022 1105   HCT 33.3 (L) 10/23/2022 0737   HCT 29.9 (L) 08/29/2022 1105   PLT 237.0 10/23/2022 0737   PLT 338 08/29/2022 1105   MCV 96.6 10/23/2022 0737   MCV 85 08/29/2022 1105   MCH 25.7 (L) 08/29/2022 1105   MCH 31.9 10/27/2021 0910   MCHC 32.5 10/23/2022 0737   RDW 24.2 (H) 10/23/2022 0737   RDW 15.8 (H) 08/29/2022 1105   LYMPHSABS 1.4 10/23/2022 0737   LYMPHSABS 1.0 08/29/2022 1105   MONOABS 0.5 10/23/2022 0737   EOSABS 0.3 10/23/2022 0737   EOSABS 0.2 08/29/2022 1105   BASOSABS 0.0 10/23/2022 0737   BASOSABS 0.1 08/29/2022 1105   CMP     Component Value Date/Time   NA 138 08/29/2022 1105   K 4.4 08/29/2022 1105   CL 99 08/29/2022 1105   CO2 24 08/29/2022 1105   GLUCOSE 101 (H) 08/29/2022 1105   GLUCOSE 102 (H) 05/30/2022 1009   BUN 14 08/29/2022  1105   CREATININE 0.94 08/29/2022 1105   CREATININE 0.87 03/20/2018 1006   CALCIUM 9.8 08/29/2022 1105   PROT 7.5 10/24/2021 0955   ALBUMIN 3.8 10/24/2021 0955   AST 39 10/24/2021 0955   ALT 34 10/24/2021 0955   ALKPHOS 82 10/24/2021 0955   BILITOT 0.9 10/24/2021 0955   GFRNONAA 57 (L) 05/30/2022 1009   GFRAA 36 (L) 11/07/2019 1920    Imaging Studies: No results found.  Assessment and Plan:   Denise Henson is a 77 y.o. y/o female has been referred for iron deficiency anemia.  The patient reports that she has had iron deficiency her whole life.  The patient has responded in the past to iron supplementation.  She reports that she would like to take iron for the next 2 months and see if it gets better since she has a very busy schedule with vacations planned over the next 2 months.  I have informed her that supplementing iron does not mean that she is not bleeding and therefore even if she responds to the iron she should consider a GI workup.  The patient has not had a colonoscopy in 20 years.  The patient has been explained the plan and agrees to contact me when she gets back from vacation to consider having the EGD and  colonoscopy for workup of her iron deficiency anemia.  She is adamant that she does not want to start any workup at this time.  Follow Up Instructions:  I discussed the assessment and treatment plan with the patient. The patient was provided an opportunity to ask questions and all were answered. The patient agreed with the plan and demonstrated an understanding of the instructions.   The patient was advised to call back or seek an in-person evaluation if the symptoms worsen or if the condition fails to improve as anticipated.  I provided 25 minutes of non-face-to-face time during this encounter including chart review In preparation for the encounter.   Midge Minium, MD  Speech recognition software was used to dictate the above note.

## 2022-11-01 NOTE — Telephone Encounter (Signed)
The patient reports that she does not want to have any workup at this time until after she finishes her vacations over the next 2 months and then will call us back in 2 months if she wants to proceed with a GI workup for her anemia.

## 2022-11-14 ENCOUNTER — Encounter: Payer: Self-pay | Admitting: Internal Medicine

## 2022-11-22 ENCOUNTER — Other Ambulatory Visit: Payer: Self-pay

## 2022-11-22 DIAGNOSIS — I484 Atypical atrial flutter: Secondary | ICD-10-CM

## 2022-11-22 MED ORDER — APIXABAN 5 MG PO TABS: 5.0000 mg | ORAL_TABLET | Freq: Two times a day (BID) | ORAL | 1 refills | Status: DC

## 2022-11-22 NOTE — Telephone Encounter (Signed)
Prescription refill request for Eliquis received. Indication: AF Last office visit: 3/24 [place] Scr: 0.94 Age: 77 Weight: 88 kg

## 2022-11-27 ENCOUNTER — Ambulatory Visit (HOSPITAL_COMMUNITY)
Admission: RE | Admit: 2022-11-27 | Discharge: 2022-11-27 | Disposition: A | Discharge status: Home / Self Care | Payer: Medicare Other | Source: Ambulatory Visit | Attending: Physician Assistant | Admitting: Physician Assistant

## 2022-11-27 ENCOUNTER — Encounter (HOSPITAL_COMMUNITY): Payer: Self-pay | Admitting: Physician Assistant

## 2022-11-27 VITALS — BP 150/88 | HR 68 | Ht 64.0 in | Wt 202.0 lb

## 2022-11-27 DIAGNOSIS — Z79899 Other long term (current) drug therapy: Secondary | ICD-10-CM

## 2022-11-27 DIAGNOSIS — D6869 Other thrombophilia: Secondary | ICD-10-CM | POA: Insufficient documentation

## 2022-11-27 DIAGNOSIS — I10 Essential (primary) hypertension: Secondary | ICD-10-CM | POA: Insufficient documentation

## 2022-11-27 DIAGNOSIS — E669 Obesity, unspecified: Secondary | ICD-10-CM | POA: Insufficient documentation

## 2022-11-27 DIAGNOSIS — I484 Atypical atrial flutter: Secondary | ICD-10-CM | POA: Diagnosis not present

## 2022-11-27 DIAGNOSIS — Z8249 Family history of ischemic heart disease and other diseases of the circulatory system: Secondary | ICD-10-CM | POA: Insufficient documentation

## 2022-11-27 DIAGNOSIS — Z5181 Encounter for therapeutic drug level monitoring: Secondary | ICD-10-CM | POA: Diagnosis not present

## 2022-11-27 DIAGNOSIS — Z87891 Personal history of nicotine dependence: Secondary | ICD-10-CM | POA: Diagnosis not present

## 2022-11-27 DIAGNOSIS — E785 Hyperlipidemia, unspecified: Secondary | ICD-10-CM | POA: Insufficient documentation

## 2022-11-27 DIAGNOSIS — Z6834 Body mass index (BMI) 34.0-34.9, adult: Secondary | ICD-10-CM | POA: Insufficient documentation

## 2022-11-27 DIAGNOSIS — I2510 Atherosclerotic heart disease of native coronary artery without angina pectoris: Secondary | ICD-10-CM | POA: Insufficient documentation

## 2022-11-27 DIAGNOSIS — Z951 Presence of aortocoronary bypass graft: Secondary | ICD-10-CM | POA: Diagnosis not present

## 2022-11-27 DIAGNOSIS — Z7901 Long term (current) use of anticoagulants: Secondary | ICD-10-CM | POA: Diagnosis not present

## 2022-11-27 LAB — BASIC METABOLIC PANEL
Anion gap: 10 (ref 5–15)
BUN: 14 mg/dL (ref 8–23)
CO2: 25 mmol/L (ref 22–32)
Calcium: 9.1 mg/dL (ref 8.9–10.3)
Chloride: 105 mmol/L (ref 98–111)
Creatinine, Ser: 0.86 mg/dL (ref 0.44–1.00)
GFR, Estimated: >60 mL/min (ref >60)
Glucose, Bld: 111 mg/dL — ABNORMAL HIGH (ref 70–99)
Potassium: 4.2 mmol/L (ref 3.5–5.1)
Sodium: 140 mmol/L (ref 135–145)

## 2022-11-27 LAB — CBC
HCT: 36.3 % (ref 36.0–46.0)
Hemoglobin: 11.6 g/dL — ABNORMAL LOW (ref 12.0–15.0)
MCH: 33.2 pg (ref 26.0–34.0)
MCHC: 32 g/dL (ref 30.0–36.0)
MCV: 104 fL — ABNORMAL HIGH (ref 80.0–100.0)
Platelets: 218 10*3/uL (ref 150–400)
RBC: 3.49 MIL/uL — ABNORMAL LOW (ref 3.87–5.11)
RDW: 17.2 % — ABNORMAL HIGH (ref 11.5–15.5)
WBC: 4.4 10*3/uL (ref 4.0–10.5)
nRBC: 0 % (ref 0.0–0.2)

## 2022-11-27 LAB — MAGNESIUM: Magnesium: 2.1 mg/dL (ref 1.7–2.4)

## 2022-11-27 NOTE — Progress Notes (Signed)
Primary Care Physician: Karie Schwalbe, MD Primary Electrophysiologist: Dr Ladona Ridgel Referring Physician: Francis Dowse PA   Denise Henson is a 77 y.o. female with a history of CAD s/p CABG 2007, HTN, HLD, atrial flutter who presents for follow up in the [facility]. Patient had been maintained on amiodarone but patient discontinued this due to concerns about off target effects. Patient is on Eliquis for a CHADS2VASC score of 5. She is referred to the AF clinic to discuss dofetilide admission. Patient is s/p dofetilide admission 7/10-7/13/23. She did not require DCCV.   On follow up today, patient reports that she has done well since her last visit. She did have frequent palpitations after returning from [place], felt to be related to possible avian flu. She wore a cardiac monitor  08/2022 which showed no afib. She is in SR today.   Today, she denies symptoms of palpitations, chest pain, shortness of breath, orthopnea, PND, lower extremity edema, presyncope, syncope, snoring, daytime somnolence, bleeding, or neurologic sequela. The patient is tolerating medications without difficulties and is otherwise without complaint today.    Atrial Fibrillation Risk Factors:  she does not have symptoms or diagnosis of sleep apnea. she does not have a history of rheumatic fever.   she has a BMI of Body mass index is 34.67 kg/m.Marland Kitchen Filed Weights   11/27/22 1034  Weight: 91.6 kg   Family History  Problem Relation Age of Onset   Heart attack Mother 70   Heart attack Maternal Grandmother    Cancer Neg Hx        Breast or colon   Diabetes Neg Hx    Hypertension Neg Hx    Colon cancer Neg Hx    Colon polyps Neg Hx    Esophageal cancer Neg Hx    Rectal cancer Neg Hx    Stomach cancer Neg Hx      Atrial Fibrillation Management history:  Previous antiarrhythmic drugs: amiodarone, dofetilide   Previous cardioversions: 04/27/21 Previous ablations: 04/25/21  Atach Anticoagulation history: Eliquis   Past Medical History:  Diagnosis Date   Allergic rhinitis due to pollen    Allergy    Anemia    Arthritis    Asthma    as a child   Atypical atrial flutter (HCC)    a. 03/2021 EPS: L sided Aflutter; b. 04/2021 s/p DCCV; c. CHA2DS2VASc = 5-->eliquis. Has been using amio prn.   Coronary artery disease    a. [facility] [place] b. 12/2017 MV: Neg.   GERD (gastroesophageal reflux disease)    Hx of pleural effusion 2007   after CABG   Hyperlipidemia    Unable to tolerate simvastatin 40 due to myalgias   Hypertension    Mild Aortic insufficiency    a. 04/2021 Echo: EF 50-55%, no rwma, mild LVH, nl RV fxn, mild BAE, mild MR/AI.   Mild Mitral regurgitation    Past Surgical History:  Procedure Laterality Date   ATRIAL TACH ABLATION N/A 04/25/2021   Procedure: ATRIAL TACH ABLATION;  Surgeon: Marinus Maw, MD;  Location: [facility] INVAS[facility] CV LAB;  Service: Cardiovascular;  Laterality: N/A;   CARDIOVERSION N/A 04/27/2021   Procedure: CARDIOVERSION;  Surgeon: Thurmon Fair, MD;  Location: [facility] ENDOSCOPY;  Service: Cardiovascular;  Laterality: N/A;   CESAREAN SECTION     CHOLECYSTECTOMY     COLONOSCOPY     CORONARY ARTERY BYPASS GRAFT  5/07   Readmitted for post op  pleural effusions   DOPPLER ECHOCARDIOGRAPHY     NV LV EF 1+ AI/MR   EXCISION METACARPAL MASS Left 02/21/2018   Procedure: EXCISION METACARPAL MASS;  Surgeon: Kennedy Bucker, MD;  Location: [facility] ORS;  Service: Orthopedics;  Laterality: Left;   Stress imaging  6/07   Negative EF 67%   TEE WITHOUT CARDIOVERSION N/A 04/27/2021   Procedure: TRANSESOPHAGEAL ECHOCARDIOGRAM (TEE);  Surgeon: Thurmon Fair, MD;  Location: [facility] ENDOSCOPY;  Service: Cardiovascular;  Laterality: N/A;   TONSILLECTOMY      Current Outpatient Medications  Medication Sig Dispense Refill   apixaban (ELIQUIS) 5 MG TABS tablet Take 1 tablet (5 mg total) by mouth 2 (two) times daily. 180  tablet 1   Calcium Carb-Cholecalciferol (CALCIUM + VITAMIN D3 PO) Take 1 tablet by mouth daily.     cyanocobalamin (VITAMIN B12) 1000 MCG tablet Take 1,000 mcg by mouth daily.     dofetilide (TIKOSYN) 250 MCG capsule TAKE ONE CAPSULE BY MOUTH TWICE A DAY 180 capsule 1   ferrous sulfate (SLOW RELEASE IRON) 160 (50 Fe) MG TBCR SR tablet Take 160 mg by mouth daily.     Glucosamine 500 MG CAPS Take 500 mg by mouth daily.     losartan (COZAAR) 50 MG tablet Take 1 tablet (50 mg total) by mouth daily. 90 tablet 3   Omega-3 Fatty Acids (FISH OIL PO) Take 1 capsule by mouth daily.     Turmeric (QC TUMERIC COMPLEX PO) Take 1 capsule by mouth See admin instructions. Take 1 tablet daily when staying home and 1 tablet twice daily when traveling     acetaminophen (TYLENOL) 650 MG CR tablet Take 1,300 mg by mouth 2 (two) times daily as needed for pain.     triamcinolone cream (KENALOG) 0.1 % Apply 1 Application topically 2 (two) times daily as needed. 45 g 1   No current facility-administered medications for this encounter.    Allergies  Allergen Reactions   Codeine Anxiety    Jittery    Morphine Anxiety    jittery   Other Anxiety    Opiates - Jittery, Anxiety   Zestril [Lisinopril] Cough    Social History   Socioeconomic History   Marital status: Widowed    Spouse name: Not on file   Number of children: 1   Years of education: Not on file   Highest education level: Not on file  Occupational History   Occupation: Runner, broadcasting/film/video (Master's in Ed)    Employer: RETIRED    Comment: Retired  Tobacco Use   Smoking status: Former    Types: Cigarettes    Quit date: 06/26/1994    Years since quitting: 28.4    Passive exposure: Never   Smokeless tobacco: Never   Tobacco comments:    Former smoker 12/28/21  Vaping Use   Vaping Use: Never used  Substance and Sexual Activity   Alcohol use: Yes    Alcohol/week: 7.0 - 14.0 standard drinks of alcohol    Types: 7 - 14 Standard drinks or equivalent per  week    Comment: 1-2 drinks daily 12/28/21   Drug use: No   Sexual activity: Never  Other Topics Concern   Not on file  Social History Narrative   Has living will   Son [name], is health care POA   Would accept resuscitation attempts   No tube feeds if cognitively unaware   Social Determinants of Health   Financial Resource Strain: Not on file  Food Insecurity: Not on  file  Transportation Needs: Not on file  Physical Activity: Not on file  Stress: Not on file  Social Connections: Not on file  Intimate Partner Violence: Not on file     ROS- All systems are reviewed and negative except as per the HPI above.  Physical Exam: Vitals:   11/27/22 1034  BP: (!) 150/88  Pulse: 68  Weight: 91.6 kg  Height: 5\' 4"  (1.626 m)    GEN- The patient is a well appearing elderly female, alert and oriented x 3 today.   HEENT-head normocephalic, atraumatic, sclera clear, conjunctiva pink, hearing intact, trachea midline. Lungs- Clear to ausculation bilaterally, normal work of breathing Heart- Regular rate and rhythm, no murmurs, rubs or gallops  GI- soft, NT, ND, + BS Extremities- no clubbing, cyanosis, or edema MS- no significant deformity or atrophy Skin- no rash or lesion Psych- euthymic mood, full affect Neuro- strength and sensation are intact   Wt Readings from Last 3 Encounters:  11/27/22 91.6 kg  11/01/22 88 kg  09/06/22 88 kg    EKG today demonstrates  SR, NST Vent. rate 68 BPM PR interval 168 ms QRS duration 82 ms QT/QTcB 404/429 ms  Echo 04/27/21 demonstrated   1. Left ventricular ejection fraction, by estimation, is 50 to 55%. The  left ventricle has low normal function. The left ventricle has no regional  wall motion abnormalities. There is mild left ventricular hypertrophy.  Indeterminate diastolic filling due to E-A fusion.   2. Right ventricular systolic function is mildly reduced. The right  ventricular size is normal. There is normal pulmonary artery  systolic  pressure.   3. Left atrial size was mildly dilated.   4. Right atrial size was mildly dilated.   5. The mitral valve is normal in structure. Mild mitral valve  regurgitation.   6. The aortic valve is normal in structure. Aortic valve regurgitation is  mild. Mild aortic valve sclerosis is present, with no evidence of aortic  valve stenosis.   7. The inferior vena cava is normal in size with greater than 50%  respiratory variability, suggesting right atrial pressure of 3 mmHg.   Epic records are reviewed at length today  CHA2DS2-VASc Score = 5  The patient's score is based upon: CHF History: 0 HTN History: 1 Diabetes History: 0 Stroke History: 0 Vascular Disease History: 1 Age Score: 2 Gender Score: 1        ASSESSMENT AND PLAN: 1. Atypical atrial flutter The patient's CHA2DS2-VASc score is 5, indicating a 7.2% annual risk of stroke.   S/p dofetilide admission 7/10-7/13/23 Monitor 08/2022 showed no afib. Patient questioning if her issues with atrial flutter have been related to acute illnesses (COVID and avian flu). She would like a trial off dofetilide. We did discuss that she is likely to have recurrent flutter off AAD and that she would need to be hospitalized again to restart the medication. She voices understanding and plans to stop dofetilide after her summer vacation. She is interested in ablation if she is felt to be a good candidate, will refer.  Continue dofetilide 250 mcg BID for now. QT stable.  Check bmet/mag today. Continue Eliquis 5 mg BID  2. Secondary Hypercoagulable State (ICD10:  D68.69) The patient is at significant risk for stroke/thromboembolism based upon her CHA2DS2-VASc Score of 5.  Continue Apixaban (Eliquis).   3. Obesity Body mass index is 34.67 kg/m. Lifestyle modification was discussed and encouraged including regular physical activity and weight reduction.  4. CAD S/p CABG No  anginal symptoms.  5. HTN Stable, no changes today.     Follow up in the AF clinic in 3 months.    Jorja Loa PA-C Afib Clinic [facility] [address] [place], [place] [address] [phone] 11/27/2022 1:16 PM

## 2022-11-27 NOTE — Patient Instructions (Signed)
Call office when stopping dofetilide - [phone]

## 2023-01-08 ENCOUNTER — Other Ambulatory Visit (HOSPITAL_COMMUNITY): Payer: Self-pay

## 2023-01-08 MED ORDER — DOFETILIDE 250 MCG PO CAPS: 250.0000 ug | ORAL_CAPSULE | Freq: Two times a day (BID) | ORAL | 0 refills | Status: DC

## 2023-01-08 NOTE — Telephone Encounter (Signed)
Patient called requesting Dofetilide to be sent into [facility]. She only wants a 30 day supply to be sent in. She will be stopping Dofetilide soon. [facility]. If she needs further refills she will reach out to the [facility].

## 2023-01-08 NOTE — Progress Notes (Unsigned)
Electrophysiology Office Note:    Date:  01/09/2023   ID:  Denise Henson, DOB May 29, 1946, MRN [id number]  PCP:  Karie Schwalbe, MD   [facility] Providers Cardiologist:  Lewayne Bunting, MD Electrophysiologist:  Lewayne Bunting, MD     Referring MD: Danice Goltz, PA   History of Present Illness:    Denise Henson is a 77 y.o. female with a medical history significant for atypical atrial flutter, CAD s/b CABG in 2007 referred for arrhythmia management.     She was diagnosed with her atrial arrhythmia a few weeks after recovering from COVID.  She was minimally symptomatic, and the rhythm was detected incidentally during a visit with her PCP  She has been maintained on amiodarone in the past but wanted to discontinue it due to possible toxicities. She underwent dofetilide admission 7/10-7/13/23. She has been maintaining sinus rhythm on this medication but has been experiencing dizziness that she attributes to the medication.  She is planning to stop the medication when she returns from a trip to [place] here in a few weeks to see if her symptoms of dizziness subside.  She underwent an EP study by Dr. Ladona Ridgel in Oct 2022 that showed an atyical left-sided flutter.      Today, she reports that she feels well.  She does not have palpitations or fatigue --no evidence of recurrence of atrial arrhythmias.  She does feel dizzy at times which she attributes to Tikosyn.   EKGs/Labs/Other Studies Reviewed Today:    Echocardiogram:  TTE 04/27/2021 EF 50-55%; left and right atria were mildly dilated   Monitors:  70 Zio; 08/31/2022  - my interpretation Sinus rhythm, heart rate 52-99 beats minute, average 66 bpm Rare ectopy including nonsustained atrial runs  Stress testing:   Advanced imaging:   Cardiac catherization    EKG:   EKG Interpretation Date/Time:  Tuesday January 09 2023 09:07:34 EDT Ventricular Rate:  75 PR Interval:  148 QRS Duration:  84 QT  Interval:  382 QTC Calculation: 426 R Axis:   72  Text Interpretation: Normal sinus rhythm with sinus arrhythmia ST & T wave abnormality, consider lateral ischemia When compared with ECG of 27-Nov-2022 10:37, Nonspecific T wave abnormality has replaced inverted T waves in Anterior leads Confirmed by York Pellant [phone]) on 01/09/2023 9:50:53 AM   EKGs --reviewed the roughly 25 EKGs available in [facility].  Most showed sinus rhythm the abnormal EKG showed showed atrial flutter.  I did not see evidence of atrial fibrillation.  Physical Exam:    VS:  BP (!) 150/76   Pulse 75   Ht 5\' 4"  (1.626 m)   Wt 195 lb 12.8 oz (88.8 kg)   SpO2 96%   BMI 33.61 kg/m     Wt Readings from Last 3 Encounters:  01/09/23 195 lb 12.8 oz (88.8 kg)  11/27/22 202 lb (91.6 kg)  11/01/22 194 lb (88 kg)     GEN:  Well nourished, well developed in no acute distress CARDIAC: RRR, no murmurs, rubs, gallops RESPIRATORY:  Normal work of breathing MUSCULOSKELETAL: no edema    ASSESSMENT & PLAN:    Left atrial flutter Left atrial flutter diagnosed by Dr. Ladona Ridgel on EP study Maintained sinus on dofetilide I have not seen any EKGs that show atrial fibrillation I do not think she is a good candidate for ablation If she feels better off Tikosyn, I think it would be reasonable to continue to monitor her rhythm If she has recurrence, she  will probably need to go back on Tikosyn  Secondary hypercoagulable state CHA2DS2-VASc score is 5 Continue apixaban 5 mg  Obesity Weight loss encouraged  CAD S/p CABG  Follow-up in AF clinic.   Signed, Maurice Small, MD  01/09/2023 9:52 AM     [facility]

## 2023-01-09 ENCOUNTER — Encounter: Payer: Self-pay | Admitting: Cardiovascular Disease

## 2023-01-09 ENCOUNTER — Ambulatory Visit: Payer: Medicare Other | Attending: Cardiovascular Disease | Admitting: Cardiovascular Disease

## 2023-01-09 VITALS — BP 150/76 | HR 75 | Ht 64.0 in | Wt 195.8 lb

## 2023-01-09 DIAGNOSIS — I471 Supraventricular tachycardia, unspecified: Secondary | ICD-10-CM

## 2023-01-09 DIAGNOSIS — I4891 Unspecified atrial fibrillation: Secondary | ICD-10-CM | POA: Diagnosis not present

## 2023-01-09 DIAGNOSIS — I484 Atypical atrial flutter: Secondary | ICD-10-CM | POA: Diagnosis not present

## 2023-01-09 NOTE — Patient Instructions (Signed)
Medication Instructions:  Your physician recommends that you continue on your current medications as directed. Please refer to the Current Medication list given to you today. *If you need a refill on your cardiac medications before your next appointment, please call your pharmacy*   Follow-Up: At [facility], you and your health needs are our priority.  As part of our continuing mission to provide you with exceptional heart care, we have created designated Provider Care Teams.  These Care Teams include your primary Cardiologist (physician) and Advanced Practice Providers (APPs -  Physician Assistants and Nurse Practitioners) who all work together to provide you with the care you need, when you need it.  We recommend signing up for the patient portal called "MyChart".  Sign up information is provided on this After Visit Summary.  MyChart is used to connect with patients for Virtual Visits (Telemedicine).  Patients are able to view lab/test results, encounter notes, upcoming appointments, etc.  Non-urgent messages can be sent to your provider as well.   To learn more about what you can do with MyChart, go to [email or web address].    Your next appointment:   Keep follow up appointment with AF clinic  Provider:   York Pellant, MD

## 2023-01-26 ENCOUNTER — Other Ambulatory Visit: Payer: Self-pay | Admitting: Internal Medicine

## 2023-01-26 DIAGNOSIS — I484 Atypical atrial flutter: Secondary | ICD-10-CM

## 2023-01-29 NOTE — Telephone Encounter (Signed)
Prescription refill request for Eliquis received. Indication:afib Last office visit:7/24 Scr:0.86  6/24 Age: 77 Weight:88.8  kg  Prescription refilled

## 2023-02-01 ENCOUNTER — Telehealth (HOSPITAL_COMMUNITY): Payer: Self-pay | Admitting: *Deleted

## 2023-02-01 NOTE — Telephone Encounter (Signed)
Patient called in stating she has returned from vacation and has stopped dofetilide. Educated that she can not restart dofetilide without readmission to the hospital. Pt verbalized agreement.

## 2023-03-07 ENCOUNTER — Encounter (HOSPITAL_COMMUNITY): Payer: Self-pay | Admitting: Physician Assistant

## 2023-03-07 ENCOUNTER — Ambulatory Visit (HOSPITAL_COMMUNITY)
Admission: RE | Admit: 2023-03-07 | Discharge: 2023-03-07 | Disposition: A | Discharge status: Home / Self Care | Payer: Medicare Other | Source: Ambulatory Visit | Attending: Physician Assistant | Admitting: Physician Assistant

## 2023-03-07 VITALS — BP 144/90 | HR 73 | Ht 64.0 in | Wt 199.8 lb

## 2023-03-07 DIAGNOSIS — D6869 Other thrombophilia: Secondary | ICD-10-CM | POA: Insufficient documentation

## 2023-03-07 DIAGNOSIS — I484 Atypical atrial flutter: Secondary | ICD-10-CM | POA: Diagnosis not present

## 2023-03-07 DIAGNOSIS — I2510 Atherosclerotic heart disease of native coronary artery without angina pectoris: Secondary | ICD-10-CM | POA: Insufficient documentation

## 2023-03-07 DIAGNOSIS — R9431 Abnormal electrocardiogram [ECG] [EKG]: Secondary | ICD-10-CM | POA: Insufficient documentation

## 2023-03-07 DIAGNOSIS — I10 Essential (primary) hypertension: Secondary | ICD-10-CM | POA: Insufficient documentation

## 2023-03-07 DIAGNOSIS — Z951 Presence of aortocoronary bypass graft: Secondary | ICD-10-CM | POA: Diagnosis not present

## 2023-03-07 DIAGNOSIS — Z6834 Body mass index (BMI) 34.0-34.9, adult: Secondary | ICD-10-CM | POA: Insufficient documentation

## 2023-03-07 DIAGNOSIS — E669 Obesity, unspecified: Secondary | ICD-10-CM | POA: Diagnosis not present

## 2023-03-07 DIAGNOSIS — Z7901 Long term (current) use of anticoagulants: Secondary | ICD-10-CM | POA: Diagnosis not present

## 2023-03-07 NOTE — Progress Notes (Signed)
Primary Care Physician: Karie Schwalbe, MD Primary Electrophysiologist: Dr Ladona Ridgel  Referring Physician: Francis Dowse PA   Denise Henson is a 77 y.o. female with a history of CAD s/p CABG 2007, HTN, HLD, atrial flutter who presents for follow up in the [facility]. Patient had been maintained on amiodarone but patient discontinued this due to concerns about off target effects. Patient is on Eliquis for a CHADS2VASC score of 5. She is referred to the AF clinic to discuss dofetilide admission. Patient is s/p dofetilide admission 7/10-7/13/23. She did not require DCCV. Patient did not have any significant recurrence of her arrhythmia and she opted for a trial off dofetilide.   On follow up today, patient reports that she has done well since her last visit. She has not had any interim symptoms of tachypalpitations. No bleeding issues on anticoagulation.   Today, she denies symptoms of palpitations, chest pain, shortness of breath, orthopnea, PND, lower extremity edema, presyncope, syncope, snoring, daytime somnolence, bleeding, or neurologic sequela. The patient is tolerating medications without difficulties and is otherwise without complaint today.    Atrial Fibrillation Risk Factors:  she does not have symptoms or diagnosis of sleep apnea. she does not have a history of rheumatic fever.   Atrial Fibrillation Management history:  Previous antiarrhythmic drugs: amiodarone, dofetilide   Previous cardioversions: 04/27/21 Previous ablations: 04/25/21 Atach Anticoagulation history: Eliquis   Past Medical History:  Diagnosis Date   Allergic rhinitis due to pollen    Allergy    Anemia    Arthritis    Asthma    as a child   Atypical atrial flutter (HCC)    a. 03/2021 EPS: L sided Aflutter; b. 04/2021 s/p DCCV; c. CHA2DS2VASc = 5-->eliquis. Has been using amio prn.   Coronary artery disease    a. 2007 [facility] x 1[facility] [place] b.  12/2017 MV: Neg.   GERD (gastroesophageal reflux disease)    Hx of pleural effusion 2007   after CABG   Hyperlipidemia    Unable to tolerate simvastatin 40 due to myalgias   Hypertension    Mild Aortic insufficiency    a. 04/2021 Echo: EF 50-55%, no rwma, mild LVH, nl RV fxn, mild BAE, mild MR/AI.   Mild Mitral regurgitation     Current Outpatient Medications  Medication Sig Dispense Refill   acetaminophen (TYLENOL) 650 MG CR tablet Take 1,300 mg by mouth 2 (two) times daily as needed for pain.     Calcium Carb-Cholecalciferol (CALCIUM + VITAMIN D3 PO) Take 1 tablet by mouth daily.     cyanocobalamin (VITAMIN B12) 1000 MCG tablet Take 1,000 mcg by mouth daily.     ELIQUIS 5 MG TABS tablet TAKE 1 TABLET BY MOUTH TWICE  DAILY 200 tablet 2   ferrous sulfate (SLOW RELEASE IRON) 160 (50 Fe) MG TBCR SR tablet Take 160 mg by mouth daily.     Glucosamine 500 MG CAPS Take 500 mg by mouth daily.     losartan (COZAAR) 50 MG tablet Take 1 tablet (50 mg total) by mouth daily. 90 tablet 3   Omega-3 Fatty Acids (FISH OIL PO) Take 1 capsule by mouth daily.     triamcinolone cream (KENALOG) 0.1 % Apply 1 Application topically 2 (two) times daily as needed. 45 g 1   Turmeric (QC TUMERIC COMPLEX PO) Take 1 capsule by mouth See admin instructions. Take 1 tablet daily when staying home and 1 tablet twice daily when  traveling     No current facility-administered medications for this encounter.   ROS- All systems are reviewed and negative except as per the HPI above.  Physical Exam: Vitals:   03/07/23 1004  BP: (!) 144/90  Pulse: 73  Weight: 90.6 kg  Height: 5\' 4"  (1.626 m)    GEN: Well nourished, well developed in no acute distress NECK: No JVD; No carotid bruits CARDIAC: Regular rate and rhythm, no murmurs, rubs, gallops RESPIRATORY:  Clear to auscultation without rales, wheezing or rhonchi  ABDOMEN: Soft, non-tender, non-distended EXTREMITIES:  No edema; No deformity    Wt Readings from Last  3 Encounters:  03/07/23 90.6 kg  01/09/23 88.8 kg  11/27/22 91.6 kg    EKG today demonstrates  SR, NST similar to previous Vent. rate 73 BPM PR interval 150 ms QRS duration 88 ms QT/QTcB 380/418 ms  Echo 04/27/21 demonstrated   1. Left ventricular ejection fraction, by estimation, is 50 to 55%. The  left ventricle has low normal function. The left ventricle has no regional  wall motion abnormalities. There is mild left ventricular hypertrophy.  Indeterminate diastolic filling due to E-A fusion.   2. Right ventricular systolic function is mildly reduced. The right  ventricular size is normal. There is normal pulmonary artery systolic  pressure.   3. Left atrial size was mildly dilated.   4. Right atrial size was mildly dilated.   5. The mitral valve is normal in structure. Mild mitral valve  regurgitation.   6. The aortic valve is normal in structure. Aortic valve regurgitation is  mild. Mild aortic valve sclerosis is present, with no evidence of aortic  valve stenosis.   7. The inferior vena cava is normal in size with greater than 50%  respiratory variability, suggesting right atrial pressure of 3 mmHg.   Epic records are reviewed at length today  CHA2DS2-VASc Score = 5  The patient's score is based upon: CHF History: 0 HTN History: 1 Diabetes History: 0 Stroke History: 0 Vascular Disease History: 1 Age Score: 2 Gender Score: 1        ASSESSMENT AND PLAN: Atypical atrial flutter The patient's CHA2DS2-VASc score is 5, indicating a 7.2% annual risk of stroke.   S/p dofetilide admission 7/10-7/13/23 Monitor 08/2022 showed no afib. Now off dofetilide  Continue Eliquis 5 mg BID  Secondary Hypercoagulable State (ICD10:  D68.69) The patient is at significant risk for stroke/thromboembolism based upon her CHA2DS2-VASc Score of 5.  Continue Apixaban (Eliquis).   Obesity Body mass index is 34.3 kg/m.  Encouraged lifestyle modification  CAD S/p CABG No anginal  symptoms  HTN Stable on current regimen   Follow up with Dr Ladona Ridgel in one year.    Denise Loa PA-C Afib Clinic [facility] [address] [place], [place] [address] [phone] 03/07/2023 1:04 PM

## 2023-03-20 ENCOUNTER — Ambulatory Visit (INDEPENDENT_AMBULATORY_CARE_PROVIDER_SITE_OTHER): Payer: Medicare Other | Admitting: Internal Medicine

## 2023-03-20 ENCOUNTER — Encounter: Payer: Self-pay | Admitting: Internal Medicine

## 2023-03-20 VITALS — BP 132/74 | HR 85 | Temp 97.7°F | Ht 64.0 in | Wt 195.0 lb

## 2023-03-20 DIAGNOSIS — R3915 Urgency of urination: Secondary | ICD-10-CM | POA: Insufficient documentation

## 2023-03-20 LAB — POC URINALSYSI DIPSTICK (AUTOMATED)
Blood, UA: NEGATIVE
Glucose, UA: NEGATIVE
Nitrite, UA: NEGATIVE
Protein, UA: POSITIVE — AB
Spec Grav, UA: 1.025 (ref 1.010–1.025)
Urobilinogen, UA: 0.2 E.U./dL
pH, UA: 5.5 (ref 5.0–8.0)

## 2023-03-20 MED ORDER — SULFAMETHOXAZOLE-TRIMETHOPRIM 800-160 MG PO TABS: 1.0000 | ORAL_TABLET | Freq: Two times a day (BID) | ORAL | 0 refills | Status: DC

## 2023-03-20 NOTE — Progress Notes (Addendum)
Subjective:    Patient ID: Denise Henson, female    DOB: 05-12-1946, 77 y.o.   MRN: [id number]  HPI Here due to urinary symptoms  Has had low grade fever---afternoon then waking in sweat in the middle of the night No sweat last night Nocturia every hour---but not as much in the day Bad urgency No dysuria No hematuria  Current Outpatient Medications on File Prior to Visit  Medication Sig Dispense Refill   acetaminophen (TYLENOL) 650 MG CR tablet Take 1,300 mg by mouth 2 (two) times daily as needed for pain.     Calcium Carb-Cholecalciferol (CALCIUM + VITAMIN D3 PO) Take 1 tablet by mouth daily.     cyanocobalamin (VITAMIN B12) 1000 MCG tablet Take 1,000 mcg by mouth daily.     ELIQUIS 5 MG TABS tablet TAKE 1 TABLET BY MOUTH TWICE  DAILY 200 tablet 2   ferrous sulfate (SLOW RELEASE IRON) 160 (50 Fe) MG TBCR SR tablet Take 160 mg by mouth daily.     Glucosamine 500 MG CAPS Take 500 mg by mouth daily.     losartan (COZAAR) 50 MG tablet Take 1 tablet (50 mg total) by mouth daily. 90 tablet 3   Omega-3 Fatty Acids (FISH OIL PO) Take 1 capsule by mouth daily.     triamcinolone cream (KENALOG) 0.1 % Apply 1 Application topically 2 (two) times daily as needed. 45 g 1   Turmeric (QC TUMERIC COMPLEX PO) Take 1 capsule by mouth See admin instructions. Take 1 tablet daily when staying home and 1 tablet twice daily when traveling     No current facility-administered medications on file prior to visit.    Allergies  Allergen Reactions   Codeine Anxiety    Jittery    Morphine Anxiety    jittery   Other Anxiety    Opiates - Jittery, Anxiety   Zestril [Lisinopril] Cough    Past Medical History:  Diagnosis Date   Allergic rhinitis due to pollen    Allergy    Anemia    Arthritis    Asthma    as a child   Atypical atrial flutter (HCC)    a. 03/2021 EPS: L sided Aflutter; b. 04/2021 s/p DCCV; c. CHA2DS2VASc = 5-->eliquis. Has been using amio prn.   Coronary artery disease    a.  [facility] [place] b. 12/2017 MV: Neg.   GERD (gastroesophageal reflux disease)    Hx of pleural effusion 2007   after CABG   Hyperlipidemia    Unable to tolerate simvastatin 40 due to myalgias   Hypertension    Mild Aortic insufficiency    a. 04/2021 Echo: EF 50-55%, no rwma, mild LVH, nl RV fxn, mild BAE, mild MR/AI.   Mild Mitral regurgitation     Past Surgical History:  Procedure Laterality Date   ATRIAL TACH ABLATION N/A 04/25/2021   Procedure: ATRIAL TACH ABLATION;  Surgeon: Marinus Maw, MD;  [facility];  Service: Cardiovascular;  Laterality: N/A;   CARDIOVERSION N/A 04/27/2021   Procedure: CARDIOVERSION;  Surgeon: Thurmon Fair, MD;  [facility];  Service: Cardiovascular;  Laterality: N/A;   CESAREAN SECTION     CHOLECYSTECTOMY     COLONOSCOPY     CORONARY ARTERY BYPASS GRAFT  5/07   Readmitted for post op pleural effusions   DOPPLER ECHOCARDIOGRAPHY     NV LV EF 1+ AI/MR   EXCISION METACARPAL MASS Left 02/21/2018   Procedure:  EXCISION METACARPAL MASS;  Surgeon: Kennedy Bucker, MD;  Location: [facility];  Service: Orthopedics;  Laterality: Left;   Stress imaging  6/07   Negative EF 67%   TEE WITHOUT CARDIOVERSION N/A 04/27/2021   Procedure: TRANSESOPHAGEAL ECHOCARDIOGRAM (TEE);  Surgeon: Thurmon Fair, MD;  [facility];  Service: Cardiovascular;  Laterality: N/A;   TONSILLECTOMY      Family History  Problem Relation Age of Onset   Heart attack Mother 45   Heart attack Maternal Grandmother    Cancer Neg Hx        Breast or colon   Diabetes Neg Hx    Hypertension Neg Hx    Colon cancer Neg Hx    Colon polyps Neg Hx    Esophageal cancer Neg Hx    Rectal cancer Neg Hx    Stomach cancer Neg Hx     Social History   Socioeconomic History   Marital status: Widowed    Spouse name: Not on file   Number of children: 1   Years of education: Not on file   Highest education level: Not on file   Occupational History   Occupation: Runner, broadcasting/film/video (Master's in Ed)    Employer: RETIRED    Comment: Retired  Tobacco Use   Smoking status: Former    Current packs/day: 0.00    Types: Cigarettes    Quit date: 06/26/1994    Years since quitting: 28.7    Passive exposure: Never   Smokeless tobacco: Never   Tobacco comments:    Former smoker 12/28/21  Vaping Use   Vaping status: Never Used  Substance and Sexual Activity   Alcohol use: Yes    Alcohol/week: 7.0 - 14.0 standard drinks of alcohol    Types: 7 - 14 Standard drinks or equivalent per week    Comment: 1-2 drinks daily 12/28/21   Drug use: No   Sexual activity: Never  Other Topics Concern   Not on file  Social History Narrative   Has living will   Son [name], is health care POA   Would accept resuscitation attempts   No tube feeds if cognitively unaware   Social Determinants of Health   Financial Resource Strain: Not on file  Food Insecurity: Not on file  Transportation Needs: Not on file  Physical Activity: Not on file  Stress: Not on file  Social Connections: Not on file  Intimate Partner Violence: Not on file   Review of Systems No N/V No abdominal or back pain---but has lower abdominal pressure Some red on toilet paper--from hemorrhoids Off tikosyn---no problems thus far    Objective:   Physical Exam Constitutional:      Appearance: Normal appearance.  Abdominal:     Palpations: Abdomen is soft.     Tenderness: There is no abdominal tenderness. There is no right CVA tenderness or left CVA tenderness.  Neurological:     Mental Status: She is alert.            Assessment & Plan:

## 2023-03-20 NOTE — Addendum Note (Signed)
Addended by: Eual Fines on: 03/20/2023 10:37 AM   Modules accepted: Orders

## 2023-03-20 NOTE — Assessment & Plan Note (Addendum)
Seems like clear bladder infection---but not clearly uncomplicated with the fever/sweats (though not last night) Urinalysis 2+ leuks but negative nitrite Will send culture  Empiric septra DS bid x 5 days (will give 10 to take along on trip)

## 2023-03-21 LAB — URINE CULTURE
MICRO NUMBER:: 15508792
Result:: NO GROWTH
SPECIMEN QUALITY:: ADEQUATE

## 2023-03-22 ENCOUNTER — Encounter: Payer: Self-pay | Admitting: Internal Medicine

## 2023-03-22 MED ORDER — LOSARTAN POTASSIUM 50 MG PO TABS: 50.0000 mg | ORAL_TABLET | Freq: Every day | ORAL | 3 refills | Status: DC

## 2023-03-22 NOTE — Addendum Note (Signed)
Addended by: Eual Fines on: 03/22/2023 03:15 PM   Modules accepted: Orders

## 2023-04-30 ENCOUNTER — Encounter: Payer: Medicare Other | Admitting: Internal Medicine

## 2023-05-02 ENCOUNTER — Encounter: Payer: Medicare Other | Admitting: Internal Medicine

## 2023-05-17 ENCOUNTER — Other Ambulatory Visit: Payer: Self-pay | Admitting: Internal Medicine

## 2023-05-17 DIAGNOSIS — I10 Essential (primary) hypertension: Secondary | ICD-10-CM

## 2023-05-17 DIAGNOSIS — I4891 Unspecified atrial fibrillation: Secondary | ICD-10-CM

## 2023-05-31 ENCOUNTER — Other Ambulatory Visit (INDEPENDENT_AMBULATORY_CARE_PROVIDER_SITE_OTHER): Payer: Medicare Other

## 2023-05-31 DIAGNOSIS — I10 Essential (primary) hypertension: Secondary | ICD-10-CM

## 2023-05-31 DIAGNOSIS — I4891 Unspecified atrial fibrillation: Secondary | ICD-10-CM

## 2023-05-31 LAB — COMPREHENSIVE METABOLIC PANEL
ALT: 38 U/L — ABNORMAL HIGH (ref 0–35)
AST: 35 U/L (ref 0–37)
Albumin: 4.1 g/dL (ref 3.5–5.2)
Alkaline Phosphatase: 74 U/L (ref 39–117)
BUN: 15 mg/dL (ref 6–23)
CO2: 30 meq/L (ref 19–32)
Calcium: 10.2 mg/dL (ref 8.4–10.5)
Chloride: 103 meq/L (ref 96–112)
Creatinine, Ser: 0.91 mg/dL (ref 0.40–1.20)
GFR: 60.56 mL/min (ref >60.00)
Glucose, Bld: 105 mg/dL — ABNORMAL HIGH (ref 70–99)
Potassium: 4.2 meq/L (ref 3.5–5.1)
Sodium: 141 meq/L (ref 135–145)
Total Bilirubin: 0.5 mg/dL (ref 0.2–1.2)
Total Protein: 6.7 g/dL (ref 6.0–8.3)

## 2023-05-31 LAB — TSH: TSH: 1.49 u[IU]/mL (ref 0.35–5.50)

## 2023-05-31 LAB — CBC
HCT: 40.7 % (ref 36.0–46.0)
Hemoglobin: 13.7 g/dL (ref 12.0–15.0)
MCHC: 33.7 g/dL (ref 30.0–36.0)
MCV: 103.4 fL — ABNORMAL HIGH (ref 78.0–100.0)
Platelets: 284 10*3/uL (ref 150.0–400.0)
RBC: 3.93 Mil/uL (ref 3.87–5.11)
RDW: 14.6 % (ref 11.5–15.5)
WBC: 6.1 10*3/uL (ref 4.0–10.5)

## 2023-05-31 LAB — LIPID PANEL
Cholesterol: 201 mg/dL — ABNORMAL HIGH (ref 0–200)
HDL: 60.3 mg/dL (ref >39.00)
LDL Cholesterol: 112 mg/dL — ABNORMAL HIGH (ref 0–99)
NonHDL: 140.23
Total CHOL/HDL Ratio: 3
Triglycerides: 139 mg/dL (ref 0.0–149.0)
VLDL: 27.8 mg/dL (ref 0.0–40.0)

## 2023-06-07 ENCOUNTER — Encounter: Payer: Self-pay | Admitting: Internal Medicine

## 2023-06-07 ENCOUNTER — Ambulatory Visit: Payer: Medicare Other | Admitting: Internal Medicine

## 2023-06-07 VITALS — BP 138/88 | HR 78 | Temp 97.9°F | Ht 63.5 in | Wt 199.0 lb

## 2023-06-07 DIAGNOSIS — I480 Paroxysmal atrial fibrillation: Secondary | ICD-10-CM

## 2023-06-07 DIAGNOSIS — I10 Essential (primary) hypertension: Secondary | ICD-10-CM | POA: Diagnosis not present

## 2023-06-07 DIAGNOSIS — Z0000 Encounter for general adult medical examination without abnormal findings: Secondary | ICD-10-CM | POA: Diagnosis not present

## 2023-06-07 DIAGNOSIS — M150 Primary generalized (osteo)arthritis: Secondary | ICD-10-CM

## 2023-06-07 NOTE — Progress Notes (Signed)
Hearing Screening - Comments:: Passed whisper test Vision Screening - Comments:: January 2024

## 2023-06-07 NOTE — Progress Notes (Signed)
Subjective:    Patient ID: Denise Henson, female    DOB: 1945-09-06, 77 y.o.   MRN: [id number]  HPI Here for Medicare wellness visit and follow up of chronic health conditions Reviewed advanced directives Reviewed other doctors---Dr Wohl--GI, Dr Pilar Jarvis cardiology, Dr Alverda Skeans, [facility] No hospitalizations or surgery in the past year Still walks every day--gardens and does other things for resistance Vision is fine Hearing is good Regular alcohol No tobacco No falls No depression or anhedonia Independent with instrumental ADLs Mild memory issues---word finding delays  Continues to travel regularly Recent trip to [place], also [place]  No health concerns of note Irritated by right hip arthritis Troubles her in AM--but then better in the next hour or so Does use tylenol at times--night mostly  Now off tikosyn No recurrence of atrial flutter or fib that she knows of--no palpitations (and smart watch recordings are normal) Continues on eliquis No chest pain Stable DOE on steps if hurrys--no change No syncope. Does have orthostatic dizziness--like hopping out of bed Known aortic atherosclerosis  Can have heartburn if eats rich desserts Will take omeprazole before and that prevents problems No dysphagia  Current Outpatient Medications on File Prior to Visit  Medication Sig Dispense Refill   acetaminophen (TYLENOL) 650 MG CR tablet Take 1,300 mg by mouth 2 (two) times daily as needed for pain.     Calcium Carb-Cholecalciferol (CALCIUM + VITAMIN D3 PO) Take 1 tablet by mouth daily.     cyanocobalamin (VITAMIN B12) 1000 MCG tablet Take 1,000 mcg by mouth daily.     ELIQUIS 5 MG TABS tablet TAKE 1 TABLET BY MOUTH TWICE  DAILY 200 tablet 2   ferrous sulfate (SLOW RELEASE IRON) 160 (50 Fe) MG TBCR SR tablet Take 160 mg by mouth daily.     Glucosamine 500 MG CAPS Take 500 mg by mouth daily.     losartan (COZAAR) 50 MG tablet Take 1 tablet (50 mg total) by  mouth daily. 90 tablet 3   Omega-3 Fatty Acids (FISH OIL PO) Take 1 capsule by mouth daily.     triamcinolone cream (KENALOG) 0.1 % Apply 1 Application topically 2 (two) times daily as needed. 45 g 1   Turmeric (QC TUMERIC COMPLEX PO) Take 1 capsule by mouth See admin instructions. Take 1 tablet daily when staying home and 1 tablet twice daily when traveling     No current facility-administered medications on file prior to visit.    Allergies  Allergen Reactions   Codeine Anxiety    Jittery    Morphine Anxiety    jittery   Other Anxiety    Opiates - Jittery, Anxiety   Zestril [Lisinopril] Cough    Past Medical History:  Diagnosis Date   Allergic rhinitis due to pollen    Allergy    Anemia    Arthritis    Asthma    as a child   Atypical atrial flutter (HCC)    a. 03/2021 EPS: L sided Aflutter; b. 04/2021 s/p DCCV; c. CHA2DS2VASc = 5-->eliquis. Has been using amio prn.   Coronary artery disease    a. [facility] [place] b. 12/2017 MV: Neg.   GERD (gastroesophageal reflux disease)    Hx of pleural effusion 2007   after CABG   Hyperlipidemia    Unable to tolerate simvastatin 40 due to myalgias   Hypertension    Mild Aortic insufficiency    a. 04/2021 Echo: EF 50-55%, no rwma,  mild LVH, nl RV fxn, mild BAE, mild MR/AI.   Mild Mitral regurgitation     Past Surgical History:  Procedure Laterality Date   ATRIAL TACH ABLATION N/A 04/25/2021   Procedure: ATRIAL TACH ABLATION;  Surgeon: Marinus Maw, MD;  [facility];  Service: Cardiovascular;  Laterality: N/A;   CARDIOVERSION N/A 04/27/2021   Procedure: CARDIOVERSION;  Surgeon: Thurmon Fair, MD;  [facility];  Service: Cardiovascular;  Laterality: N/A;   CESAREAN SECTION     CHOLECYSTECTOMY     COLONOSCOPY     CORONARY ARTERY BYPASS GRAFT  5/07   Readmitted for post op pleural effusions   DOPPLER ECHOCARDIOGRAPHY     NV LV EF 1+ AI/MR   EXCISION  METACARPAL MASS Left 02/21/2018   Procedure: EXCISION METACARPAL MASS;  Surgeon: Kennedy Bucker, MD;  Location: [facility];  Service: Orthopedics;  Laterality: Left;   Stress imaging  6/07   Negative EF 67%   TEE WITHOUT CARDIOVERSION N/A 04/27/2021   Procedure: TRANSESOPHAGEAL ECHOCARDIOGRAM (TEE);  Surgeon: Thurmon Fair, MD;  [facility];  Service: Cardiovascular;  Laterality: N/A;   TONSILLECTOMY      Family History  Problem Relation Age of Onset   Heart attack Mother 71   Heart attack Maternal Grandmother    Cancer Neg Hx        Breast or colon   Diabetes Neg Hx    Hypertension Neg Hx    Colon cancer Neg Hx    Colon polyps Neg Hx    Esophageal cancer Neg Hx    Rectal cancer Neg Hx    Stomach cancer Neg Hx     Social History   Socioeconomic History   Marital status: Widowed    Spouse name: Not on file   Number of children: 1   Years of education: Not on file   Highest education level: Master's degree (e.g., MA, MS, MEng, MEd, MSW, MBA)  Occupational History   Occupation: Runner, broadcasting/film/video (Master's in Ed)    Employer: RETIRED    Comment: Retired  Tobacco Use   Smoking status: Former    Current packs/day: 0.00    Types: Cigarettes    Quit date: 06/26/1994    Years since quitting: 28.9    Passive exposure: Never   Smokeless tobacco: Never   Tobacco comments:    Former smoker 12/28/21  Vaping Use   Vaping status: Never Used  Substance and Sexual Activity   Alcohol use: Yes    Alcohol/week: 7.0 - 14.0 standard drinks of alcohol    Types: 7 - 14 Standard drinks or equivalent per week    Comment: 1-2 drinks daily 12/28/21   Drug use: No   Sexual activity: Never  Other Topics Concern   Not on file  Social History Narrative   Has living will   Son [name], is health care POA   Would accept resuscitation attempts   No tube feeds if cognitively unaware   Social Drivers of Health   Financial Resource Strain: Not on file  Food Insecurity: No Food Insecurity  (05/02/2023)   Hunger Vital Sign    Worried About Running Out of Food in the Last Year: Never true    Ran Out of Food in the Last Year: Never true  Transportation Needs: No Transportation Needs (05/02/2023)   PRAPARE - Administrator, Civil Service (Medical): No    Lack of Transportation (Non-Medical): No  Physical Activity: Insufficiently Active (05/02/2023)  Exercise Vital Sign    Days of Exercise per Week: 3 days    Minutes of Exercise per Session: 40 min  Stress: No Stress Concern Present (05/02/2023)   [employer] of Occupational Health - Occupational Stress Questionnaire    Feeling of Stress : Only a little  Social Connections: Moderately Integrated (05/02/2023)   Social Connection and Isolation Panel [NHANES]    Frequency of Communication with Friends and Family: More than three times a week    Frequency of Social Gatherings with Friends and Family: More than three times a week    Attends Religious Services: More than 4 times per year    Active Member of [employer] or Organizations: Yes    Attends Banker Meetings: More than 4 times per year    Marital Status: Widowed  Intimate Partner Violence: Not on file   Review of Systems Appetite is okay Weight is stable Sleeps is variable---okay most nights Wears seat belt Teeth are okay---keeps up with dentist Bowels move fine--no blood. Is on iron--uses stool softener Voids fine---no sig incontinence No suspicious skin lesions    Objective:   Physical Exam Constitutional:      Appearance: Normal appearance.  HENT:     Mouth/Throat:     Pharynx: No oropharyngeal exudate or posterior oropharyngeal erythema.  Eyes:     Conjunctiva/sclera: Conjunctivae normal.     Pupils: Pupils are equal, round, and reactive to light.  Cardiovascular:     Rate and Rhythm: Normal rate and regular rhythm.     Pulses: Normal pulses.     Heart sounds: No murmur heard.    No gallop.  Pulmonary:     Effort: Pulmonary  effort is normal.     Breath sounds: Normal breath sounds. No wheezing or rales.  Abdominal:     Palpations: Abdomen is soft.     Tenderness: There is no abdominal tenderness.  Musculoskeletal:     Cervical back: Neck supple.     Right lower leg: No edema.     Left lower leg: No edema.  Lymphadenopathy:     Cervical: No cervical adenopathy.  Skin:    Findings: No rash.  Neurological:     General: No focal deficit present.     Mental Status: She is alert and oriented to person, place, and time.     Comments: Word naming---12 /1 minute Recall --- 3/3  Psychiatric:        Mood and Affect: Mood normal.        Behavior: Behavior normal.            Assessment & Plan:

## 2023-06-07 NOTE — Assessment & Plan Note (Signed)
Still doing okay without tikosyn Continues on eliqius 5 bid

## 2023-06-07 NOTE — Assessment & Plan Note (Signed)
Mostly right hip No limitations Uses tylenol prn

## 2023-06-07 NOTE — Assessment & Plan Note (Signed)
I have personally reviewed the Medicare Annual Wellness questionnaire and have noted 1. The patient's medical and social history 2. Their use of alcohol, tobacco or illicit drugs 3. Their current medications and supplements 4. The patient's functional ability including ADL's, fall risks, home safety risks and hearing or visual             impairment. 5. Diet and physical activities 6. Evidence for depression or mood disorders  The patients weight, height, BMI and visual acuity have been recorded in the chart I have made referrals, counseling and provided education to the patient based review of the above and I have provided the pt with a written personalized care plan for preventive services.  I have provided you with a copy of your personalized plan for preventive services. Please take the time to review along with your updated medication list.  Healthy Exercises regularly Will get flu and COVID vaccines soon Also recommended RSV

## 2023-06-07 NOTE — Assessment & Plan Note (Signed)
BP Readings from Last 3 Encounters:  06/07/23 138/88  03/20/23 132/74  03/07/23 (!) 144/90   On losartan 50

## 2023-07-06 ENCOUNTER — Encounter: Payer: Self-pay | Admitting: Internal Medicine

## 2023-07-09 MED ORDER — LOSARTAN POTASSIUM 50 MG PO TABS: 50.0000 mg | ORAL_TABLET | Freq: Every day | ORAL | 3 refills | Status: DC

## 2023-09-17 ENCOUNTER — Encounter: Payer: Self-pay | Admitting: Internal Medicine

## 2023-09-17 DIAGNOSIS — I484 Atypical atrial flutter: Secondary | ICD-10-CM

## 2023-09-17 MED ORDER — LOSARTAN POTASSIUM 50 MG PO TABS: 50.0000 mg | ORAL_TABLET | Freq: Every day | ORAL | 3 refills | Status: DC

## 2023-09-17 MED ORDER — APIXABAN 5 MG PO TABS: 5.0000 mg | ORAL_TABLET | Freq: Two times a day (BID) | ORAL | 0 refills | Status: DC

## 2023-09-17 NOTE — Telephone Encounter (Signed)
 Prescription refill request for Eliquis received. Indication: Afib  Last office visit: 03/07/23 Charlean Merl)  Scr: 0.91 (05/31/23)  Age: 78 Weight: 90.3kg  Appropriate dose. Refill sent.

## 2023-09-18 MED ORDER — LOSARTAN POTASSIUM 50 MG PO TABS: 50.0000 mg | ORAL_TABLET | Freq: Every day | ORAL | 3 refills | Status: AC

## 2023-09-18 NOTE — Addendum Note (Signed)
 Addended by: Eual Fines on: 09/18/2023 08:14 AM   Modules accepted: Orders

## 2023-09-24 ENCOUNTER — Encounter: Payer: Self-pay | Admitting: Internal Medicine

## 2023-09-24 ENCOUNTER — Ambulatory Visit (INDEPENDENT_AMBULATORY_CARE_PROVIDER_SITE_OTHER)
Admission: RE | Admit: 2023-09-24 | Discharge: 2023-09-24 | Disposition: A | Discharge status: Home / Self Care | Payer: Medicare (Managed Care) | Source: Ambulatory Visit | Attending: Internal Medicine | Admitting: Internal Medicine

## 2023-09-24 ENCOUNTER — Ambulatory Visit (INDEPENDENT_AMBULATORY_CARE_PROVIDER_SITE_OTHER): Payer: Medicare (Managed Care) | Admitting: Internal Medicine

## 2023-09-24 VITALS — BP 118/70 | HR 74 | Temp 98.5°F | Ht 63.5 in | Wt 203.0 lb

## 2023-09-24 DIAGNOSIS — R053 Chronic cough: Secondary | ICD-10-CM

## 2023-09-24 MED ORDER — PREDNISONE 20 MG PO TABS: 40.0000 mg | ORAL_TABLET | Freq: Every day | ORAL | 0 refills | Status: DC

## 2023-09-24 MED ORDER — AZITHROMYCIN 250 MG PO TABS
ORAL_TABLET | ORAL | 0 refills | Status: DC
Start: 2023-09-24 — End: 2024-01-30

## 2023-09-24 NOTE — Assessment & Plan Note (Addendum)
 Has findings of mild asthma ---had this as a child Will check CXR to be sure no lobar pneumonia  CXR look stable over the past 2 years (RUL nodule gone----and some chronic scarring) No lobar pneumonia Will give short course of prednisone 40 x 3, 20 x 3 Z-pak---just in case

## 2023-09-24 NOTE — Progress Notes (Signed)
 Subjective:    Patient ID: Denise Henson, female    DOB: March 19, 1946, 78 y.o.   MRN: [id number]  HPI Here due to a respiratory infection  Has been coughing since a cruise 3 weeks ago Has to sleep in chair Coughing up lots of clear stuff--also clear rhinorrhea No fever Some SOB--mostly with cough  (and seems to gather in her airway) Oxygen levels are normal  No sore throat or headache No ear pain  Tried antihistamine--no help Using ibuprofen--may help some Nothing for the cough Has heard some wheezing--but seems in upper airway  Current Outpatient Medications on File Prior to Visit  Medication Sig Dispense Refill   acetaminophen (TYLENOL) 650 MG CR tablet Take 1,300 mg by mouth 2 (two) times daily as needed for pain.     apixaban (ELIQUIS) 5 MG TABS tablet Take 1 tablet (5 mg total) by mouth 2 (two) times daily. 240 tablet 0   Calcium Carb-Cholecalciferol (CALCIUM + VITAMIN D3 PO) Take 1 tablet by mouth daily.     cyanocobalamin (VITAMIN B12) 1000 MCG tablet Take 1,000 mcg by mouth daily.     ferrous sulfate (SLOW RELEASE IRON) 160 (50 Fe) MG TBCR SR tablet Take 160 mg by mouth daily.     Glucosamine 500 MG CAPS Take 500 mg by mouth daily.     losartan (COZAAR) 50 MG tablet Take 1 tablet (50 mg total) by mouth daily. 90 tablet 3   Omega-3 Fatty Acids (FISH OIL PO) Take 1 capsule by mouth daily.     triamcinolone cream (KENALOG) 0.1 % Apply 1 Application topically 2 (two) times daily as needed. 45 g 1   Turmeric (QC TUMERIC COMPLEX PO) Take 1 capsule by mouth See admin instructions. Take 1 tablet daily when staying home and 1 tablet twice daily when traveling     No current facility-administered medications on file prior to visit.    Allergies  Allergen Reactions   Codeine Anxiety    Jittery    Morphine Anxiety    jittery   Other Anxiety    Opiates - Jittery, Anxiety   Zestril [Lisinopril] Cough    Past Medical History:  Diagnosis Date   Allergic rhinitis due to  pollen    Allergy    Anemia    Arthritis    Asthma    as a child   Atypical atrial flutter (HCC)    a. 03/2021 EPS: L sided Aflutter; b. 04/2021 s/p DCCV; c. CHA2DS2VASc = 5-->eliquis. Has been using amio prn.   Coronary artery disease    a. 2007 [facility] [place] b. 12/2017 MV: Neg.   GERD (gastroesophageal reflux disease)    Hx of pleural effusion 2007   after CABG   Hyperlipidemia    Unable to tolerate simvastatin 40 due to myalgias   Hypertension    Mild Aortic insufficiency    a. 04/2021 Echo: EF 50-55%, no rwma, mild LVH, nl RV fxn, mild BAE, mild MR/AI.   Mild Mitral regurgitation     Past Surgical History:  Procedure Laterality Date   ATRIAL TACH ABLATION N/A 04/25/2021   Procedure: ATRIAL TACH ABLATION;  Surgeon: Marinus Maw, MD;  [facility];  Service: Cardiovascular;  Laterality: N/A;   CARDIOVERSION N/A 04/27/2021   Procedure: CARDIOVERSION;  Surgeon: Thurmon Fair, MD;  [facility];  Service: Cardiovascular;  Laterality: N/A;   CESAREAN SECTION     CHOLECYSTECTOMY     COLONOSCOPY  CORONARY ARTERY BYPASS GRAFT  5/07   Readmitted for post op pleural effusions   DOPPLER ECHOCARDIOGRAPHY     NV LV EF 1+ AI/MR   EXCISION METACARPAL MASS Left 02/21/2018   Procedure: EXCISION METACARPAL MASS;  Surgeon: Kennedy Bucker, MD;  Location: [facility];  Service: Orthopedics;  Laterality: Left;   Stress imaging  6/07   Negative EF 67%   TEE WITHOUT CARDIOVERSION N/A 04/27/2021   Procedure: TRANSESOPHAGEAL ECHOCARDIOGRAM (TEE);  Surgeon: Thurmon Fair, MD;  [facility];  Service: Cardiovascular;  Laterality: N/A;   TONSILLECTOMY      Family History  Problem Relation Age of Onset   Heart attack Mother 66   Heart attack Maternal Grandmother    Cancer Neg Hx        Breast or colon   Diabetes Neg Hx    Hypertension Neg Hx    Colon cancer Neg Hx    Colon polyps Neg Hx    Esophageal cancer Neg Hx     Rectal cancer Neg Hx    Stomach cancer Neg Hx     Social History   Socioeconomic History   Marital status: Widowed    Spouse name: Not on file   Number of children: 1   Years of education: Not on file   Highest education level: Master's degree (e.g., MA, MS, MEng, MEd, MSW, MBA)  Occupational History   Occupation: Runner, broadcasting/film/video (Master's in Ed)    Employer: RETIRED    Comment: Retired  Tobacco Use   Smoking status: Former    Current packs/day: 0.00    Types: Cigarettes    Quit date: 06/26/1994    Years since quitting: 29.2    Passive exposure: Never   Smokeless tobacco: Never   Tobacco comments:    Former smoker 12/28/21  Vaping Use   Vaping status: Never Used  Substance and Sexual Activity   Alcohol use: Yes    Alcohol/week: 7.0 - 14.0 standard drinks of alcohol    Types: 7 - 14 Standard drinks or equivalent per week    Comment: 1-2 drinks daily 12/28/21   Drug use: No   Sexual activity: Never  Other Topics Concern   Not on file  Social History Narrative   Has living will   Son Denise Henson, is health care POA   Would accept resuscitation attempts   No tube feeds if cognitively unaware   Social Drivers of Health   Financial Resource Strain: Low Risk  (09/23/2023)   Overall Financial Resource Strain (CARDIA)    Difficulty of Paying Living Expenses: Not hard at all  Food Insecurity: No Food Insecurity (09/23/2023)   Hunger Vital Sign    Worried About Running Out of Food in the Last Year: Never true    Ran Out of Food in the Last Year: Never true  Transportation Needs: No Transportation Needs (09/23/2023)   PRAPARE - Administrator, Civil Service (Medical): No    Lack of Transportation (Non-Medical): No  Physical Activity: Sufficiently Active (09/23/2023)   Exercise Vital Sign    Days of Exercise per Week: 7 days    Minutes of Exercise per Session: 30 min  Stress: No Stress Concern Present (09/23/2023)   [employer] of Occupational Health - Occupational  Stress Questionnaire    Feeling of Stress : Only a little  Social Connections: Moderately Integrated (09/23/2023)   Social Connection and Isolation Panel [NHANES]    Frequency of Communication with Friends and Family: More than three  times a week    Frequency of Social Gatherings with Friends and Family: More than three times a week    Attends Religious Services: More than 4 times per year    Active Member of Clubs or Organizations: Yes    Attends Banker Meetings: More than 4 times per year    Marital Status: Widowed  Intimate Partner Violence: Not on file   Review of Systems Near vomiting from the cough--and some nausea Appetite is off     Objective:   Physical Exam Constitutional:      Appearance: Normal appearance.  HENT:     Head:     Comments: No sinus tenderness    Right Ear: Tympanic membrane and ear canal normal.     Left Ear: Tympanic membrane and ear canal normal.     Mouth/Throat:     Pharynx: No oropharyngeal exudate or posterior oropharyngeal erythema.  Pulmonary:     Effort: Pulmonary effort is normal.     Breath sounds: No rales.     Comments: Slightly prolonged expiratory phase and exp "moan" Musculoskeletal:     Cervical back: Neck supple.  Lymphadenopathy:     Cervical: No cervical adenopathy.  Neurological:     Mental Status: She is alert.            Assessment & Plan:

## 2023-10-01 ENCOUNTER — Encounter: Payer: Self-pay | Admitting: Internal Medicine

## 2023-12-04 ENCOUNTER — Other Ambulatory Visit: Payer: Self-pay | Admitting: Internal Medicine

## 2023-12-04 DIAGNOSIS — I484 Atypical atrial flutter: Secondary | ICD-10-CM

## 2023-12-04 NOTE — Telephone Encounter (Signed)
 Prescription refill request for Eliquis  received. Indication: Afib  Last office visit: 03/07/23 Lujean Sake)  Scr:0.91 (05/31/23)   Age: 78 Weight: 92.1kg  Appropriate dose. Refill sent.

## 2024-01-24 ENCOUNTER — Telehealth: Payer: Self-pay

## 2024-01-24 DIAGNOSIS — R739 Hyperglycemia, unspecified: Secondary | ICD-10-CM

## 2024-01-24 DIAGNOSIS — E559 Vitamin D deficiency, unspecified: Secondary | ICD-10-CM

## 2024-01-24 DIAGNOSIS — M8580 Other specified disorders of bone density and structure, unspecified site: Secondary | ICD-10-CM

## 2024-01-24 DIAGNOSIS — I700 Atherosclerosis of aorta: Secondary | ICD-10-CM

## 2024-01-24 DIAGNOSIS — I484 Atypical atrial flutter: Secondary | ICD-10-CM

## 2024-01-24 DIAGNOSIS — I480 Paroxysmal atrial fibrillation: Secondary | ICD-10-CM

## 2024-01-24 NOTE — Telephone Encounter (Unsigned)
 Copied from CRM [id number]. Topic: Clinical - Request for Lab/Test Order >> Jan 24, 2024  9:17 AM Zane F wrote: Reason for CRM:   Patient is calling in to have her routine blood work done before her scheduled appointment on January 30, 2024. Please submit all blood work applicable and contact the patient for scheduling.   Callback Number: [id number]

## 2024-01-25 ENCOUNTER — Encounter: Payer: Self-pay | Admitting: Internal Medicine

## 2024-01-25 NOTE — Telephone Encounter (Signed)
 Copied from CRM [id number]. Topic: Clinical - Request for Lab/Test Order >> Jan 24, 2024  9:17 AM Zane F wrote: Reason for CRM:   Patient is calling in to have her routine blood work done before her scheduled appointment on January 30, 2024. Please submit all blood work applicable and contact the patient for scheduling.   Callback Number: [id number] >> Jan 25, 2024 12:22 PM Zane F wrote: Patient called in to get an update on whether the routine blood work before her appointment on August 6th had been placed. The patient would like to inform the office that she doesn't want to have the office visit without having her labs completed for review so she would have to cancel the August 6th appointment if the lab orders are not placed for scheduling.   Please call the patient back for an update by end of business day.   Callback Number: [id number]

## 2024-01-25 NOTE — Telephone Encounter (Signed)
 Message routed to PCP Jimmy Charlie FERNS, MD . Please advise. Do your new patients usually have labs done before establishing with you?

## 2024-01-25 NOTE — Telephone Encounter (Signed)
 Patient should have labs collected before appointment on Wednesday.

## 2024-01-28 DIAGNOSIS — M8580 Other specified disorders of bone density and structure, unspecified site: Secondary | ICD-10-CM | POA: Diagnosis not present

## 2024-01-28 DIAGNOSIS — I484 Atypical atrial flutter: Secondary | ICD-10-CM | POA: Diagnosis not present

## 2024-01-28 DIAGNOSIS — R739 Hyperglycemia, unspecified: Secondary | ICD-10-CM | POA: Diagnosis not present

## 2024-01-28 DIAGNOSIS — I700 Atherosclerosis of aorta: Secondary | ICD-10-CM | POA: Diagnosis not present

## 2024-01-28 DIAGNOSIS — E559 Vitamin D deficiency, unspecified: Secondary | ICD-10-CM | POA: Diagnosis not present

## 2024-01-28 DIAGNOSIS — I480 Paroxysmal atrial fibrillation: Secondary | ICD-10-CM | POA: Diagnosis not present

## 2024-01-29 ENCOUNTER — Ambulatory Visit: Payer: Self-pay | Admitting: Student

## 2024-01-29 LAB — CBC WITH DIFFERENTIAL/PLATELET
Absolute Lymphocytes: 1197 {cells}/uL (ref 850–3900)
Absolute Monocytes: 577 {cells}/uL (ref 200–950)
Basophils Absolute: 68 {cells}/uL (ref 0–200)
Basophils Relative: 1.1 %
Eosinophils Absolute: 217 {cells}/uL (ref 15–500)
Eosinophils Relative: 3.5 %
HCT: 39.6 % (ref 35.0–45.0)
Hemoglobin: 12.9 g/dL (ref 11.7–15.5)
MCH: 32.7 pg (ref 27.0–33.0)
MCHC: 32.6 g/dL (ref 32.0–36.0)
MCV: 100.3 fL — ABNORMAL HIGH (ref 80.0–100.0)
MPV: 10.5 fL (ref 7.5–12.5)
Monocytes Relative: 9.3 %
Neutro Abs: 4142 {cells}/uL (ref 1500–7800)
Neutrophils Relative %: 66.8 %
Platelets: 267 Thousand/uL (ref 140–400)
RBC: 3.95 Million/uL (ref 3.80–5.10)
RDW: 13.6 % (ref 11.0–15.0)
Total Lymphocyte: 19.3 %
WBC: 6.2 Thousand/uL (ref 3.8–10.8)

## 2024-01-29 LAB — COMPLETE METABOLIC PANEL WITHOUT GFR
AG Ratio: 1.6 (calc) (ref 1.0–2.5)
ALT: 26 U/L (ref 6–29)
AST: 31 U/L (ref 10–35)
Albumin: 3.9 g/dL (ref 3.6–5.1)
Alkaline phosphatase (APISO): 78 U/L (ref 37–153)
BUN: 10 mg/dL (ref 7–25)
CO2: 30 mmol/L (ref 20–32)
Calcium: 9.3 mg/dL (ref 8.6–10.4)
Chloride: 103 mmol/L (ref 98–110)
Creat: 0.81 mg/dL (ref 0.60–1.00)
Globulin: 2.5 g/dL (ref 1.9–3.7)
Glucose, Bld: 100 mg/dL — ABNORMAL HIGH (ref 65–99)
Potassium: 4.3 mmol/L (ref 3.5–5.3)
Sodium: 140 mmol/L (ref 135–146)
Total Bilirubin: 0.5 mg/dL (ref 0.2–1.2)
Total Protein: 6.4 g/dL (ref 6.1–8.1)

## 2024-01-29 LAB — LIPID PANEL
Cholesterol: 192 mg/dL (ref <200)
HDL: 68 mg/dL (ref >=50)
LDL Cholesterol (Calc): 101 mg/dL — ABNORMAL HIGH
Non-HDL Cholesterol (Calc): 124 mg/dL (ref <130)
Total CHOL/HDL Ratio: 2.8 (calc) (ref <5.0)
Triglycerides: 130 mg/dL (ref <150)

## 2024-01-29 LAB — TSH: TSH: 1.66 m[IU]/L (ref 0.40–4.50)

## 2024-01-29 LAB — HEMOGLOBIN A1C
Hgb A1c MFr Bld: 5.5 % (ref <5.7)
Mean Plasma Glucose: 111 mg/dL
eAG (mmol/L): 6.2 mmol/L

## 2024-01-29 LAB — VITAMIN D 25 HYDROXY (VIT D DEFICIENCY, FRACTURES): Vit D, 25-Hydroxy: 49 ng/mL (ref 30–100)

## 2024-01-29 LAB — VITAMIN B12: Vitamin B-12: 790 pg/mL (ref 200–1100)

## 2024-01-30 ENCOUNTER — Encounter: Payer: Self-pay | Admitting: Student

## 2024-01-30 ENCOUNTER — Ambulatory Visit: Payer: Medicare (Managed Care) | Admitting: Student

## 2024-01-30 VITALS — BP 160/86 | HR 63 | Temp 98.0°F | Ht 63.5 in | Wt 199.2 lb

## 2024-01-30 DIAGNOSIS — K219 Gastro-esophageal reflux disease without esophagitis: Secondary | ICD-10-CM

## 2024-01-30 DIAGNOSIS — M8580 Other specified disorders of bone density and structure, unspecified site: Secondary | ICD-10-CM | POA: Diagnosis not present

## 2024-01-30 DIAGNOSIS — N200 Calculus of kidney: Secondary | ICD-10-CM

## 2024-01-30 DIAGNOSIS — H04203 Unspecified epiphora, bilateral lacrimal glands: Secondary | ICD-10-CM | POA: Diagnosis not present

## 2024-01-30 DIAGNOSIS — G43109 Migraine with aura, not intractable, without status migrainosus: Secondary | ICD-10-CM | POA: Diagnosis not present

## 2024-01-30 DIAGNOSIS — I10 Essential (primary) hypertension: Secondary | ICD-10-CM

## 2024-01-30 DIAGNOSIS — I872 Venous insufficiency (chronic) (peripheral): Secondary | ICD-10-CM | POA: Diagnosis not present

## 2024-01-30 DIAGNOSIS — I480 Paroxysmal atrial fibrillation: Secondary | ICD-10-CM

## 2024-01-30 DIAGNOSIS — I25810 Atherosclerosis of coronary artery bypass graft(s) without angina pectoris: Secondary | ICD-10-CM | POA: Diagnosis not present

## 2024-01-30 MED ORDER — OMEPRAZOLE 20 MG PO CPDR: 20.0000 mg | DELAYED_RELEASE_CAPSULE | Freq: Every day | ORAL | 3 refills | Status: DC | PRN

## 2024-01-30 NOTE — Patient Instructions (Signed)
 VISIT SUMMARY:  During your visit, we reviewed your medical conditions and discussed preparations for your upcoming trip to [place]. We addressed your episodes of ocular migraine, increased tearing in your eyes, arthritis, atrial flutter, hypertension, coronary artery disease, chronic venous insufficiency, osteoarthritis, gastroesophageal reflux disease, osteopenia, and history of nephrolithiasis. We also discussed medications for travel-related illnesses.  YOUR PLAN:  -PAROXYSMAL ATRIAL FLUTTER AND ATRIAL FIBRILLATION: You have experienced fewer episodes of atrial flutter and have been off medication for over a year. Continue to monitor your heart rate with a wearable device, carry metoprolol  for emergencies, and continue taking apixaban  to prevent blood clots.  -HYPERTENSION: Your blood pressure was elevated during the visit, likely due to white coat syndrome. Continue taking losartan  to manage your blood pressure and monitor it at home.  -CORONARY ARTERY DISEASE, STATUS POST CORONARY ARTERY BYPASS GRAFT: You have a history of coronary artery disease and underwent bypass surgery in your early sixties. No new treatment changes were made.  -CHRONIC VENOUS INSUFFICIENCY OF LOWER EXTREMITIES: You have chronic venous insufficiency in your legs and have declined surgical intervention. No new treatment changes were made.  -OSTEOARTHRITIS OF RIGHT HAND AND HIPS: You have osteoarthritis in your right hand and hips, with stiffness that improves throughout the day. No imaging was performed, and no new treatment changes were made.  -GASTROESOPHAGEAL REFLUX DISEASE: You have gastroesophageal reflux disease and have been using omeprazole  as needed for relief. Continue using omeprazole  as needed, and be aware of potential long-term effects on nutrient absorption and bone density.  -OSTEOPENIA: You have osteopenia, which means your bone density is lower than normal. Continue taking calcium  and vitamin D   supplements. Your last DEXA scan was in 2012.  -HISTORY OF NEPHROLITHIASIS: You have a history of kidney stones but have not had any recent episodes. No new treatment changes were made.  -OCULAR MIGRAINE, LEFT EYE: You experienced three episodes of ocular migraine while in [place], which resolved within an hour and did not cause pain. No new treatment changes were made.  -EPIPHORA AND MORNING EYE DISCHARGE: You have experienced increased tearing and morning eye discharge over the past six months. Discuss these symptoms with your ophthalmologist at your next appointment.  INSTRUCTIONS:  Continue monitoring your heart rate and blood pressure at home. Carry metoprolol  for emergencies. Discuss your eye symptoms with your ophthalmologist at your next appointment. For your upcoming trip to [place], we will provide medications for travel-related illnesses, including azithromycin  for traveler's diarrhea and a malarial prophylactic.

## 2024-01-30 NOTE — Progress Notes (Unsigned)
 Location:  [facility] POS: [facility] Provider: ABDUL  Code Status: Full Code Goals of Care:     01/30/2024    1:06 PM  Advanced Directives  Does Patient Have a Medical Advance Directive? Yes  Type of Estate agent of [place];Living will  Does patient want to make changes to medical advance directive? No - Patient declined  Copy of Healthcare Power of Attorney in Chart? No - copy requested     Chief Complaint  Patient presents with  . Establish Care    NP to Establish Care.     HPI:   Discussed the use of AI scribe software for clinical note transcription with the patient, who gave verbal consent to proceed.  History of Present Illness   Denise Henson is a 78 year old female who presents to establish care. She is here for a comprehensive review of her medical conditions and travel preparation.  She experienced three episodes of ocular migraine while traveling in [place] over the summer. These episodes were characterized by an aura in her left eye, lasting less than an hour, with decreasing severity over time. The first episode was the most disturbing due to the size of the aura. No pain was associated with these episodes.  Over the past six months, she has noticed increased tearing in her eyes at night, resulting in crusting in the corners of her eyes upon waking. She initially suspected conjunctivitis but ruled it out due to the absence of redness. Her last eye exam is not due until spring, and she is regularly monitored by an ophthalmologist due to her father's history of eye issues.  She has arthritis in her right hand, affecting the same two fingers as her mother, and experiences stiffness in her right hip, which improves as the day progresses. She experiences stiffness in her right hip, which improves as the day progresses.  She developed atrial flutter following a COVID-19 infection, which initially required medical intervention. She was placed  on medication that was unpleasant but effective, experiencing episodes every two weeks that resolved spontaneously. After a suspected bird flu infection in [place], her episodes ceased, and she has been off medication for over a year without recurrence. She occasionally experiences brief episodes of increased heart rate, which resolve quickly. She carries metoprolol  for emergencies but has not needed to use it.  She is preparing for a trip to [place] in January and requests medications for travel-related illnesses, including azithromycin  for traveler's diarrhea and a malarial prophylactic. She has previously used ciprofloxacin  successfully for traveler's diarrhea but is open to alternatives.  She has a history of coronary artery disease, having undergone bypass surgery in her early sixties. She monitors her blood pressure at home, which typically runs around 130/80 mmHg, and takes losartan  for hypertension. She also takes Eliquis  for atrial flutter, along with various supplements including turmeric, vitamin D , calcium , B12, iron, glucosamine, and omega-3.  She has a history of low hemoglobin levels during stressful periods, managed with Slow Fe iron supplements and vitamin C. She has experienced left-sided sciatica and osteopenia, for which she takes calcium  and vitamin D . She has a history of nephrolithiasis with two episodes of kidney stones.  She quit smoking in 1997 and consumes alcohol daily, likening her habits to Inola Child's approach to cooking with wine. She has a history of chronic venous insufficiency, for which she declined surgery. She drives regularly, including long distances, and has not had any falls in the  past ten years.      - Tobacco: Former smoker (1997) - Alcohol: Consumes alcohol daily, typically a cocktail. - Employment: Tourist information centre manager (3 years), Consulting civil engineer - Partner Status: Widowed - The patient has a Manufacturing engineer in education, has done extensive  volunteer work, and has organized Engineer, civil (consulting). The patient is a frequent traveler, having visited [place], [place], and plans to visit [place]. The patient has one son and three grandsons.   Past Medical History:  Diagnosis Date  . Allergic rhinitis due to pollen   . Allergy   . Anemia   . Arthritis   . Asthma    as a child  . Atypical atrial flutter (HCC)    a. 03/2021 EPS: L sided Aflutter; b. 04/2021 s/p DCCV; c. CHA2DS2VASc = 5-->eliquis . Has been using amio prn.  . Coronary artery disease    a. [facility] [place] b. 12/2017 MV: Neg.  SABRA GERD (gastroesophageal reflux disease)   . Hx of pleural effusion 2007   after CABG  . Hyperlipidemia    Unable to tolerate simvastatin  40 due to myalgias  . Hypertension   . Mild Aortic insufficiency    a. 04/2021 Echo: EF 50-55%, no rwma, mild LVH, nl RV fxn, mild BAE, mild MR/AI.  SABRA Mild Mitral regurgitation     Past Surgical History:  Procedure Laterality Date  . ATRIAL TACH ABLATION N/A 04/25/2021   Procedure: ATRIAL TACH ABLATION;  Surgeon: Waddell Danelle ORN, MD;  Location: [facility];  Service: Cardiovascular;  Laterality: N/A;  . CARDIOVERSION N/A 04/27/2021   Procedure: CARDIOVERSION;  Surgeon: Francyne Headland, MD;  Location: [facility] ENDOSCOPY;  Service: Cardiovascular;  Laterality: N/A;  . CESAREAN SECTION    . CHOLECYSTECTOMY    . COLONOSCOPY    . CORONARY ARTERY BYPASS GRAFT  5/07   Readmitted for post op pleural effusions  . DOPPLER ECHOCARDIOGRAPHY     NV LV EF 1+ AI/MR  . EXCISION METACARPAL MASS Left 02/21/2018   Procedure: EXCISION METACARPAL MASS;  Surgeon: Kathlynn Sharper, MD;  Location: [facility] ORS;  Service: Orthopedics;  Laterality: Left;  . Stress imaging  6/07   Negative EF 67%  . TEE WITHOUT CARDIOVERSION N/A 04/27/2021   Procedure: TRANSESOPHAGEAL ECHOCARDIOGRAM (TEE);  Surgeon: Francyne Headland, MD;  Location: [facility] ENDOSCOPY;  Service: Cardiovascular;  Laterality: N/A;  .  TONSILLECTOMY      Allergies  Allergen Reactions  . Codeine Anxiety    Jittery   . Morphine  Anxiety    jittery  . Other Anxiety    Opiates - Jittery, Anxiety  . Zestril [Lisinopril] Cough    Outpatient Encounter Medications as of 01/30/2024  Medication Sig  . acetaminophen  (TYLENOL ) 650 MG CR tablet Take 1,300 mg by mouth 2 (two) times daily as needed for pain.  . apixaban  (ELIQUIS ) 5 MG TABS tablet TAKE 1 TABLET TWICE A DAY  . Calcium  Carb-Cholecalciferol (CALCIUM  + VITAMIN D3 PO) Take 1 tablet by mouth daily.  . cyanocobalamin  (VITAMIN B12) 1000 MCG tablet Take 1,000 mcg by mouth daily. (Patient taking differently: Take 1,000 mcg by mouth. Three times weekly)  . ferrous sulfate  (SLOW RELEASE IRON) 160 (50 Fe) MG TBCR SR tablet Take 160 mg by mouth daily.  . Glucosamine 500 MG CAPS Take 500 mg by mouth daily.  . losartan  (COZAAR ) 50 MG tablet Take 1 tablet (50 mg total) by mouth daily.  . Omega-3 Fatty Acids (FISH OIL PO) Take 1 capsule  by mouth daily.  . omeprazole  (PRILOSEC) 20 MG capsule Take 1 capsule (20 mg total) by mouth daily as needed.  . Turmeric (QC TUMERIC COMPLEX PO) Take 1 capsule by mouth See admin instructions. Take 1 tablet daily when staying home and 1 tablet twice daily when traveling  . [DISCONTINUED] azithromycin  (ZITHROMAX  Z-PAK) 250 MG tablet Take 2 tablets (500 mg) on  Day 1,  followed by 1 tablet (250 mg) once daily on Days 2 through 5.  . [DISCONTINUED] predniSONE  (DELTASONE ) 20 MG tablet Take 2 tablets (40 mg total) by mouth daily. For 3 days, then 1 tab daily for 3 days  . [DISCONTINUED] triamcinolone  cream (KENALOG ) 0.1 % Apply 1 Application topically 2 (two) times daily as needed.   No facility-administered encounter medications on file as of 01/30/2024.    Review of Systems:  Review of Systems  Health Maintenance  Topic Date Due  . COVID-19 Vaccine (8 - Moderna risk 2024-25 season) 01/06/2024  . INFLUENZA VACCINE  01/25/2024  . Medicare Annual  Wellness (AWV)  06/06/2024  . DTaP/Tdap/Td (3 - Tdap) 03/15/2027  . Pneumococcal Vaccine: 50+ Years  Completed  . DEXA SCAN  Completed  . Hepatitis C Screening  Completed  . Zoster Vaccines- Shingrix  Completed  . Hepatitis B Vaccines  Aged Out  . HPV VACCINES  Aged Out  . Meningococcal B Vaccine  Aged Out  . Colonoscopy  Discontinued    Physical Exam: Vitals:   01/30/24 1302 01/30/24 1308  BP: (!) 164/90 (!) 160/86  Pulse: 63   Temp: 98 F (36.7 C)   SpO2: 98%   Weight: 199 lb 3.2 oz (90.4 kg)   Height: 5' 3.5 (1.613 m)    Body mass index is 34.73 kg/m. Physical Exam Physical Exam   HEENT: Ears clear, no cerumen impaction. Pupils equal, round, and reactive to light. CHEST: Lungs clear to auscultation bilaterally. CARDIOVASCULAR: Heart rate regular. EXTREMITIES: Trace edema in lower extremities. NEUROLOGICAL: Awake and alert.      Labs reviewed: Basic Metabolic Panel: Recent Labs    05/31/23 0858 01/28/24 0737  NA 141 140  K 4.2 4.3  CL 103 103  CO2 30 30  GLUCOSE 105* 100*  BUN 15 10  CREATININE 0.91 0.81  CALCIUM  10.2 9.3  TSH 1.49 1.66   Liver Function Tests: Recent Labs    05/31/23 0858 01/28/24 0737  AST 35 31  ALT 38* 26  ALKPHOS 74  --   BILITOT 0.5 0.5  PROT 6.7 6.4  ALBUMIN 4.1  --    No results for input(s): LIPASE, AMYLASE in the last 8760 hours. No results for input(s): AMMONIA in the last 8760 hours. CBC: Recent Labs    05/31/23 0858 01/28/24 0737  WBC 6.1 6.2  NEUTROABS  --  4,142  HGB 13.7 12.9  HCT 40.7 39.6  MCV 103.4* 100.3*  PLT 284.0 267   Lipid Panel: Recent Labs    05/31/23 0858 01/28/24 0737  CHOL 201* 192  HDL 60.30 68  LDLCALC 112* 101*  TRIG 139.0 130  CHOLHDL 3 2.8   Lab Results  Component Value Date   HGBA1C 5.5 01/28/2024    Procedures since last visit: No results found. Results          Assessment/Plan     Paroxysmal atrial flutter and atrial fibrillation Episodes decreased in  frequency and resolved after a presumed bird flu infection. Off medication for over a year with no significant episodes. Occasional brief episodes of  increased heart rate, self-resolving. - Continue monitoring heart rate with wearable device. - Carry metoprolol  for emergency use. - Continue apixaban  for anticoagulation.  Hypertension Blood pressure elevated during visit, attributed to white coat syndrome. Typically around 130/80 mmHg at home. - Continue losartan  for blood pressure management. - Monitor blood pressure at home.  Coronary artery disease, status post coronary artery bypass graft History of coronary artery bypass graft in early sixties.  Chronic venous insufficiency of lower extremities Diagnosed with chronic venous insufficiency. Declined surgical intervention.  Osteoarthritis of right hand and hips Osteoarthritis in right hand and hips, with stiffness improving throughout the day. No imaging performed.  Gastroesophageal reflux disease Previously on omeprazole . Currently using omeprazole  as needed for symptomatic relief. Discussed potential long-term effects of omeprazole  on nutrient absorption and bone density. - Prescribe omeprazole  for as-needed use.  Osteopenia Managed with calcium  and vitamin D  supplementation. Last DEXA scan in 2012. Declines repeat imaging at this time.   History of nephrolithiasis No recent episodes reported.  Ocular migraine, left eye Three episodes experienced while in [place], with decreasing severity. No pain associated, resolved within an hour.  Epiphora and morning eye discharge Increased tearing and morning eye discharge over the past six months. No signs of conjunctivitis. - Discuss symptoms with ophthalmologist at next appointment.       Labs/tests ordered:  * No order type specified * Next appt:  06/11/2024

## 2024-01-31 DIAGNOSIS — G43109 Migraine with aura, not intractable, without status migrainosus: Secondary | ICD-10-CM | POA: Insufficient documentation

## 2024-02-28 ENCOUNTER — Encounter: Payer: Self-pay | Admitting: Internal Medicine

## 2024-03-03 ENCOUNTER — Encounter: Payer: Self-pay | Admitting: Student

## 2024-03-03 MED ORDER — COVID-19 MRNA VACCINE (PFIZER) 30 MCG/0.3ML IM SUSP: 0.3000 mL | Freq: Once | INTRAMUSCULAR | 0 refills | Status: AC

## 2024-04-29 ENCOUNTER — Telehealth: Payer: Self-pay

## 2024-04-29 NOTE — Telephone Encounter (Signed)
 Copied from CRM [id number]. Topic: General - Other >> Apr 29, 2024  1:40 PM Alfonso HERO wrote: Reason for CRM: patient is calling because Sherleen is stating that Dr Nahosi K. Bowa is not on their list and for her to be added to please call them at [phone].

## 2024-05-06 NOTE — Telephone Encounter (Signed)
 Spoke to pt. Advised her what Leita advised.

## 2024-05-06 NOTE — Telephone Encounter (Signed)
 Copied from CRM #[id number]. Topic: General - Other >> May 06, 2024 11:55 AM Frederich PARAS wrote: Reason for CRM: Pt called she would like to speak to Kallie Kirsch in regarding to [facility] credentials, pt have some very important questions to ask.

## 2024-05-06 NOTE — Telephone Encounter (Signed)
 I am checking with a CMA at [facility] to find out if there is anyone doing the pt's refills until they transfer out somewhere since Dr Abdul is gone.

## 2024-05-06 NOTE — Telephone Encounter (Signed)
 I think this is better suited for management to take care of.

## 2024-05-07 ENCOUNTER — Ambulatory Visit: Payer: Medicare (Managed Care) | Admitting: Internal Medicine

## 2024-06-11 ENCOUNTER — Ambulatory Visit: Payer: Medicare (Managed Care) | Admitting: Student

## 2024-06-13 ENCOUNTER — Other Ambulatory Visit: Payer: Self-pay | Admitting: Internal Medicine

## 2024-06-13 DIAGNOSIS — I484 Atypical atrial flutter: Secondary | ICD-10-CM

## 2024-06-30 ENCOUNTER — Telehealth: Payer: Self-pay | Admitting: Internal Medicine

## 2024-06-30 NOTE — Telephone Encounter (Signed)
 Patient was scheduled with EP APP on 07/03/24 for a 1 yr follow up per Daril Kicks, PA from 02/2023 visit. Patient called in and spoke with scheduler Stephania Jacob - patient told Stephania that patients insurance said the 07/03/24 appt was not covered under insurance due to the specialty. Patient canceled the 07/03/24 appt, and requested to establish with gen cards - patient is scheduled for 2/16 with Dr. Argentina in [place].

## 2024-07-01 ENCOUNTER — Ambulatory Visit (INDEPENDENT_AMBULATORY_CARE_PROVIDER_SITE_OTHER): Payer: Medicare (Managed Care)

## 2024-07-01 VITALS — BP 132/82 | HR 76 | Temp 98.2°F | Ht 64.0 in | Wt 203.0 lb

## 2024-07-01 DIAGNOSIS — K219 Gastro-esophageal reflux disease without esophagitis: Secondary | ICD-10-CM | POA: Diagnosis not present

## 2024-07-01 DIAGNOSIS — I484 Atypical atrial flutter: Secondary | ICD-10-CM | POA: Diagnosis not present

## 2024-07-01 DIAGNOSIS — E611 Iron deficiency: Secondary | ICD-10-CM

## 2024-07-01 DIAGNOSIS — Z2989 Encounter for other specified prophylactic measures: Secondary | ICD-10-CM | POA: Diagnosis not present

## 2024-07-01 DIAGNOSIS — Z7184 Encounter for health counseling related to travel: Secondary | ICD-10-CM

## 2024-07-01 DIAGNOSIS — I10 Essential (primary) hypertension: Secondary | ICD-10-CM

## 2024-07-01 DIAGNOSIS — E538 Deficiency of other specified B group vitamins: Secondary | ICD-10-CM | POA: Diagnosis not present

## 2024-07-01 DIAGNOSIS — M8580 Other specified disorders of bone density and structure, unspecified site: Secondary | ICD-10-CM | POA: Diagnosis not present

## 2024-07-01 MED ORDER — ATOVAQUONE-PROGUANIL HCL 250-100 MG PO TABS: 1.0000 | ORAL_TABLET | Freq: Every day | ORAL | 0 refills | Status: AC

## 2024-07-01 MED ORDER — OMEPRAZOLE 20 MG PO CPDR: 20.0000 mg | DELAYED_RELEASE_CAPSULE | Freq: Every day | ORAL | 0 refills | Status: AC | PRN

## 2024-07-01 MED ORDER — AZITHROMYCIN 250 MG PO TABS: 500.0000 mg | ORAL_TABLET | Freq: Every day | ORAL | 0 refills | Status: AC

## 2024-07-01 NOTE — Progress Notes (Signed)
 "  Subjective:   This visit was conducted in person. The patient gave informed consent to the use of Abridge AI technology to record the contents of the encounter as documented below.   Patient ID: [place], female    DOB: 1946-05-16, 79 y.o.   MRN: [id number]   Discussed the use of AI scribe software for clinical note transcription with the patient, who gave verbal consent to proceed.  History of Present Illness Den[place] H[place] is a 79 year old female who presents for medication refills and travel preparation.  She has a history of atrial flutter, previously managed with ablation and cardioversion, and has been stable for almost two years on Eliquis  twice daily. She monitors her heart rate closely and notes occasional increases to nearly 100 bpm during physical activity, which resolve quickly.  She has venous insufficiency in her legs.  She has a history of high blood pressure, currently managed with losartan  50 mg daily.  She experiences ocular migraines characterized by an aura without pain, described as a 'distortion of vision' lasting less than five minutes, with the most recent episode occurring two days ago. She does not take medication for these episodes.  She has acid reflux managed with omeprazole  20 mg daily, which takes one to two hours to take effect. She prefers daily use to manage symptoms, as attempts to stop the medication have led to symptom recurrence.  She has a history of sciatica but has not experienced symptoms recently. She reports arthritis in her hips, causing pain that resolves with walking at night.  She has a history of kidney stones but has not had an episode in a few years.  She deals with anemia regularly and was taking iron supplements but has stopped for the past two weeks due to constipation. Her last labs five months ago showed no anemia, and she plans to have her iron levels checked again.  She takes omega-3 fish oil daily, turmeric, a B12  supplement once a week, and a calcium  and vitamin D  supplement.  She is preparing for a trip to the [place] and [place] and is concerned about travel-related health issues, including the need for typhoid vaccination and malaria prophylaxis. She has previously taken Malarone  for malaria prevention without side effects and is considering taking it again for this trip. She is also concerned about traveler's diarrhea and has previously used Cipro  for bacterial diarrhea.  She lives independently in [place], is a widow, and frequently visits her family, including grandchildren. She drives herself and is planning to travel to the [place] and [place], specifically to see the [facility].    Review of Systems  All other systems reviewed and are negative.       Allergies[1]  Medications Ordered Prior to Encounter[2]  BP 132/82   Pulse 76   Temp 98.2 F (36.8 C) (Oral)   Ht 5' 4 (1.626 m)   Wt 203 lb (92.1 kg)   SpO2 93%   BMI 34.84 kg/m   Objective:       Physical Exam VITALS: BP- 132/82 GENERAL: Alert, cooperative, well developed, no acute distress. HEAD: Normocephalic atraumatic. EYES: Extraocular movements intact BL, pupils round, equal and reactive to light BL, conjunctivae normal BL. EXTREMITIES: No cyanosis or edema. NEUROLOGICAL: Oriented to person, place and time, no gait abnormalities, moves all extremities without gross motor or sensory deficit.       Assessment & Plan:   New patient, past medical and social history thoroughly reviewed  and updated in chart.  Assessment & Plan Travel medicine counseling and malaria prophylaxis Discussed necessary vaccinations and prophylactic measures for travel to [place] and the [place]. Hepatitis A and B vaccinations are up to date. Typhoid vaccination recommended from health department. Malaria prophylaxis given. Discussed azithromycin  for traveller's diarrhea. - She will get typhoid vaccination at the health department. - Prescribed  Malarone  for malaria prophylaxis, one tablet daily for 12 days. - Prescribed azithromycin  for travelers diarrhea prophylaxis, two tablets for three days. - Advised symptomatic treatment for viral sinusitis with nasal sprays and Mucinex.  Gastroesophageal reflux disease Currently on omeprazole  20 mg daily. Discussed potential long-term risks of daily PPI use, including impact on bone density. Consideration of switching to Pepcid for trial due to osteopenia history.  Patient would like to switch at a later time. - Continue omeprazole  20 mg daily until after travel. - Will consider trial of Pepcid after travel if symptoms persist.  Osteopenia Last bone density test in 2012. Discussed potential impact of long-term PPI use on bone density. Encouraged weight-bearing exercises and adequate calcium -vitamin D  intake. - Ordered bone density scan after return from travel. - Encouraged weight-bearing exercises, including walking on inclines and Pilates. - Continue current calcium -vitamin D  supplement  Iron deficiency Currently not taking iron supplements due to constipation. Last iron level check several months ago showed deficiency.  - Ordered iron panel, if low, would restart iron supplement 3 times weekly  Vitamin B12 deficiency Currently taking B12 supplement once a week. Plan to reassess B12 levels - Ordered B12 level after return from travel. - Will consider adjusting B12 supplementation based on lab results.  Atrial flutter, status post ablation, on anticoagulation, no rate control. - Continue Eliquis  twice daily. - Follow up with cardiologist on February 16th.  Hypertension Blood pressure well-controlled at 132/82 mmHg. Currently on losartan  50 mg daily. - Continue losartan  50 mg daily.    Return in about 6 weeks (around 08/12/2024) for CPE, fasting labs 1wk prior .   Mashawn Brazil K Olivier Frayre, MD  07/01/2024     Contains text generated by Abridge.        [1]  Allergies Allergen Reactions    Codeine Anxiety    Jittery    Morphine  Anxiety    jittery   Other Anxiety    Opiates - Jittery, Anxiety   Zestril [Lisinopril] Cough  [2]  Current Outpatient Medications on File Prior to Visit  Medication Sig Dispense Refill   acetaminophen  (TYLENOL ) 650 MG CR tablet Take 1,300 mg by mouth 2 (two) times daily as needed for pain.     Calcium  Carb-Cholecalciferol (CALCIUM  + VITAMIN D3 PO) Take 1 tablet by mouth daily.     cyanocobalamin  (VITAMIN B12) 1000 MCG tablet Take 1,000 mcg by mouth daily. (Patient taking differently: Take 1,000 mcg by mouth. Three times weekly)     ELIQUIS  5 MG TABS tablet TAKE 1 TABLET TWICE A DAY 180 tablet 3   ferrous sulfate  (SLOW RELEASE IRON) 160 (50 Fe) MG TBCR SR tablet Take 160 mg by mouth daily.     Glucosamine 500 MG CAPS Take 500 mg by mouth daily.     losartan  (COZAAR ) 50 MG tablet Take 1 tablet (50 mg total) by mouth daily. 90 tablet 3   Omega-3 Fatty Acids (FISH OIL PO) Take 1 capsule by mouth daily.     Turmeric (QC TUMERIC COMPLEX PO) Take 1 capsule by mouth See admin instructions. Take 1 tablet daily when staying home and 1 tablet twice  daily when traveling     No current facility-administered medications on file prior to visit.   "

## 2024-07-01 NOTE — Patient Instructions (Addendum)
 Thank you for visiting  Healthcare today! Here's what we talked about: - Go get Typhoid vaccine from health department, clarify about any further vaccines needed - Pick up Malaria medication and antibiotic - If fevers of 100.4 or more while abroad, seek medical attention

## 2024-07-02 NOTE — Telephone Encounter (Signed)
 Spoke to pt. Advised her the note has not been signed, yet.

## 2024-07-03 ENCOUNTER — Ambulatory Visit: Payer: Medicare (Managed Care) | Admitting: Student

## 2024-08-11 ENCOUNTER — Ambulatory Visit: Payer: Medicare (Managed Care)

## 2024-08-14 ENCOUNTER — Other Ambulatory Visit: Payer: Medicare (Managed Care)
